# Patient Record
Sex: Male | Born: 1935 | Race: Black or African American | Hispanic: No | Marital: Single | Smoking: Former smoker
Health system: Southern US, Community
[De-identification: ages and names within clinical notes are randomized; demographics above are authoritative.]

## PROBLEM LIST (undated history)

## (undated) ENCOUNTER — Emergency Department (HOSPITAL_COMMUNITY): Admission: EM | Payer: Medicare Other | Source: Home / Self Care

## (undated) DIAGNOSIS — J449 Chronic obstructive pulmonary disease, unspecified: Secondary | ICD-10-CM

## (undated) DIAGNOSIS — I10 Essential (primary) hypertension: Secondary | ICD-10-CM

## (undated) DIAGNOSIS — Z9981 Dependence on supplemental oxygen: Secondary | ICD-10-CM

## (undated) DIAGNOSIS — I714 Abdominal aortic aneurysm, without rupture, unspecified: Secondary | ICD-10-CM

## (undated) DIAGNOSIS — R918 Other nonspecific abnormal finding of lung field: Secondary | ICD-10-CM

## (undated) DIAGNOSIS — N189 Chronic kidney disease, unspecified: Secondary | ICD-10-CM

## (undated) DIAGNOSIS — I509 Heart failure, unspecified: Secondary | ICD-10-CM

## (undated) DIAGNOSIS — Z5189 Encounter for other specified aftercare: Secondary | ICD-10-CM

## (undated) DIAGNOSIS — T7840XA Allergy, unspecified, initial encounter: Secondary | ICD-10-CM

## (undated) DIAGNOSIS — K7010 Alcoholic hepatitis without ascites: Secondary | ICD-10-CM

## (undated) DIAGNOSIS — M109 Gout, unspecified: Secondary | ICD-10-CM

## (undated) DIAGNOSIS — I4891 Unspecified atrial fibrillation: Secondary | ICD-10-CM

## (undated) DIAGNOSIS — D649 Anemia, unspecified: Secondary | ICD-10-CM

## (undated) DIAGNOSIS — S82143A Displaced bicondylar fracture of unspecified tibia, initial encounter for closed fracture: Secondary | ICD-10-CM

## (undated) DIAGNOSIS — I723 Aneurysm of iliac artery: Secondary | ICD-10-CM

## (undated) HISTORY — DX: Anemia, unspecified: D64.9

## (undated) HISTORY — DX: Abdominal aortic aneurysm, without rupture: I71.4

## (undated) HISTORY — PX: ABDOMINAL AORTIC ANEURYSM REPAIR: SUR1152

## (undated) HISTORY — DX: Chronic obstructive pulmonary disease, unspecified: J44.9

## (undated) HISTORY — DX: Abdominal aortic aneurysm, without rupture, unspecified: I71.40

## (undated) HISTORY — DX: Displaced bicondylar fracture of unspecified tibia, initial encounter for closed fracture: S82.143A

## (undated) HISTORY — DX: Allergy, unspecified, initial encounter: T78.40XA

## (undated) HISTORY — DX: Alcoholic hepatitis without ascites: K70.10

## (undated) HISTORY — PX: BLADDER SURGERY: SHX569

## (undated) HISTORY — PX: OTHER SURGICAL HISTORY: SHX169

## (undated) HISTORY — DX: Essential (primary) hypertension: I10

## (undated) HISTORY — DX: Chronic kidney disease, unspecified: N18.9

## (undated) HISTORY — DX: Dependence on supplemental oxygen: Z99.81

## (undated) HISTORY — DX: Aneurysm of iliac artery: I72.3

## (undated) HISTORY — DX: Unspecified atrial fibrillation: I48.91

## (undated) HISTORY — DX: Encounter for other specified aftercare: Z51.89

## (undated) HISTORY — DX: Heart failure, unspecified: I50.9

## (undated) HISTORY — DX: Other nonspecific abnormal finding of lung field: R91.8

---

## 1999-05-23 ENCOUNTER — Ambulatory Visit (HOSPITAL_BASED_OUTPATIENT_CLINIC_OR_DEPARTMENT_OTHER): Admission: RE | Admit: 1999-05-23 | Discharge: 1999-05-23 | Payer: Self-pay | Admitting: *Deleted

## 2000-07-10 ENCOUNTER — Ambulatory Visit (HOSPITAL_COMMUNITY): Admission: RE | Admit: 2000-07-10 | Discharge: 2000-07-10 | Payer: Self-pay | Admitting: Gastroenterology

## 2003-07-19 ENCOUNTER — Ambulatory Visit (HOSPITAL_COMMUNITY): Admission: RE | Admit: 2003-07-19 | Discharge: 2003-07-19 | Payer: Self-pay | Admitting: Gastroenterology

## 2003-07-19 ENCOUNTER — Encounter (INDEPENDENT_AMBULATORY_CARE_PROVIDER_SITE_OTHER): Payer: Self-pay | Admitting: *Deleted

## 2004-02-04 ENCOUNTER — Emergency Department (HOSPITAL_COMMUNITY): Admission: EM | Admit: 2004-02-04 | Discharge: 2004-02-04 | Payer: Self-pay | Admitting: Emergency Medicine

## 2006-08-18 ENCOUNTER — Emergency Department (HOSPITAL_COMMUNITY): Admission: EM | Admit: 2006-08-18 | Discharge: 2006-08-18 | Payer: Self-pay | Admitting: Emergency Medicine

## 2007-06-26 ENCOUNTER — Ambulatory Visit: Admission: RE | Admit: 2007-06-26 | Discharge: 2007-06-26 | Payer: Self-pay | Admitting: Urology

## 2007-07-14 ENCOUNTER — Inpatient Hospital Stay (HOSPITAL_COMMUNITY): Admission: RE | Admit: 2007-07-14 | Discharge: 2007-07-17 | Payer: Self-pay | Admitting: Urology

## 2007-07-14 ENCOUNTER — Encounter (INDEPENDENT_AMBULATORY_CARE_PROVIDER_SITE_OTHER): Payer: Self-pay | Admitting: Urology

## 2010-01-31 ENCOUNTER — Encounter: Admission: RE | Admit: 2010-01-31 | Discharge: 2010-01-31 | Payer: Self-pay | Admitting: Cardiology

## 2010-02-15 ENCOUNTER — Inpatient Hospital Stay (HOSPITAL_COMMUNITY): Admission: EM | Admit: 2010-02-15 | Discharge: 2010-02-20 | Payer: Self-pay | Admitting: Emergency Medicine

## 2010-02-19 ENCOUNTER — Encounter (INDEPENDENT_AMBULATORY_CARE_PROVIDER_SITE_OTHER): Payer: Self-pay | Admitting: Internal Medicine

## 2010-02-20 ENCOUNTER — Telehealth: Payer: Self-pay | Admitting: Internal Medicine

## 2010-03-16 DIAGNOSIS — I509 Heart failure, unspecified: Secondary | ICD-10-CM | POA: Insufficient documentation

## 2010-03-16 DIAGNOSIS — I340 Nonrheumatic mitral (valve) insufficiency: Secondary | ICD-10-CM | POA: Insufficient documentation

## 2010-03-16 DIAGNOSIS — D649 Anemia, unspecified: Secondary | ICD-10-CM | POA: Insufficient documentation

## 2010-03-16 DIAGNOSIS — K2211 Ulcer of esophagus with bleeding: Secondary | ICD-10-CM | POA: Insufficient documentation

## 2010-03-16 DIAGNOSIS — I4891 Unspecified atrial fibrillation: Secondary | ICD-10-CM | POA: Insufficient documentation

## 2010-03-16 DIAGNOSIS — J4489 Other specified chronic obstructive pulmonary disease: Secondary | ICD-10-CM | POA: Insufficient documentation

## 2010-03-16 DIAGNOSIS — J449 Chronic obstructive pulmonary disease, unspecified: Secondary | ICD-10-CM | POA: Insufficient documentation

## 2010-03-20 ENCOUNTER — Telehealth: Payer: Self-pay | Admitting: Internal Medicine

## 2010-06-07 ENCOUNTER — Inpatient Hospital Stay (HOSPITAL_COMMUNITY): Admission: EM | Admit: 2010-06-07 | Discharge: 2010-06-12 | Payer: Self-pay | Admitting: Emergency Medicine

## 2010-08-28 ENCOUNTER — Inpatient Hospital Stay (HOSPITAL_COMMUNITY)
Admission: EM | Admit: 2010-08-28 | Discharge: 2010-09-06 | Payer: Self-pay | Source: Home / Self Care | Attending: Internal Medicine | Admitting: Internal Medicine

## 2010-08-29 LAB — BASIC METABOLIC PANEL
BUN: 28 mg/dL — ABNORMAL HIGH (ref 6–23)
CO2: 22 mEq/L (ref 19–32)
Calcium: 9 mg/dL (ref 8.4–10.5)
Chloride: 108 mEq/L (ref 96–112)
Creatinine, Ser: 1.65 mg/dL — ABNORMAL HIGH (ref 0.4–1.5)
GFR calc Af Amer: 50 mL/min — ABNORMAL LOW (ref >60)
GFR calc non Af Amer: 41 mL/min — ABNORMAL LOW (ref >60)
Glucose, Bld: 164 mg/dL — ABNORMAL HIGH (ref 70–99)
Potassium: 4.6 mEq/L (ref 3.5–5.1)
Sodium: 141 mEq/L (ref 135–145)

## 2010-08-29 LAB — BLOOD GAS, ARTERIAL
Acid-base deficit: 3.6 mmol/L — ABNORMAL HIGH (ref 0.0–2.0)
Bicarbonate: 20.8 mEq/L (ref 20.0–24.0)
Drawn by: 244801
O2 Content: 3 L/min
O2 Saturation: 98.9 %
Patient temperature: 98.6
TCO2: 21.9 mmol/L (ref 0–100)
pCO2 arterial: 36.8 mmHg (ref 35.0–45.0)
pH, Arterial: 7.371 (ref 7.350–7.450)
pO2, Arterial: 114 mmHg — ABNORMAL HIGH (ref 80.0–100.0)

## 2010-08-29 LAB — CARDIAC PANEL(CRET KIN+CKTOT+MB+TROPI)
CK, MB: 5 ng/mL — ABNORMAL HIGH (ref 0.3–4.0)
Relative Index: INVALID (ref 0.0–2.5)
Total CK: 78 U/L (ref 7–232)
Troponin I: 0.05 ng/mL (ref 0.00–0.06)

## 2010-08-29 LAB — TSH: TSH: 0.474 u[IU]/mL (ref 0.350–4.500)

## 2010-08-30 LAB — CBC
HCT: 26.5 % — ABNORMAL LOW (ref 39.0–52.0)
Hemoglobin: 7.3 g/dL — ABNORMAL LOW (ref 13.0–17.0)
MCH: 20 pg — ABNORMAL LOW (ref 26.0–34.0)
MCHC: 27.5 g/dL — ABNORMAL LOW (ref 30.0–36.0)
MCV: 72.6 fL — ABNORMAL LOW (ref 78.0–100.0)
Platelets: 143 10*3/uL — ABNORMAL LOW (ref 150–400)
RBC: 3.65 MIL/uL — ABNORMAL LOW (ref 4.22–5.81)
RDW: 20.1 % — ABNORMAL HIGH (ref 11.5–15.5)
WBC: 11 10*3/uL — ABNORMAL HIGH (ref 4.0–10.5)

## 2010-08-30 LAB — BASIC METABOLIC PANEL
BUN: 32 mg/dL — ABNORMAL HIGH (ref 6–23)
CO2: 22 mEq/L (ref 19–32)
Calcium: 8.8 mg/dL (ref 8.4–10.5)
Chloride: 107 mEq/L (ref 96–112)
Creatinine, Ser: 1.54 mg/dL — ABNORMAL HIGH (ref 0.4–1.5)
GFR calc Af Amer: 54 mL/min — ABNORMAL LOW (ref >60)
GFR calc non Af Amer: 45 mL/min — ABNORMAL LOW (ref >60)
Glucose, Bld: 161 mg/dL — ABNORMAL HIGH (ref 70–99)
Potassium: 4.3 mEq/L (ref 3.5–5.1)
Sodium: 137 mEq/L (ref 135–145)

## 2010-08-31 LAB — BASIC METABOLIC PANEL
BUN: 35 mg/dL — ABNORMAL HIGH (ref 6–23)
CO2: 23 mEq/L (ref 19–32)
Calcium: 8.9 mg/dL (ref 8.4–10.5)
Chloride: 104 mEq/L (ref 96–112)
Creatinine, Ser: 1.36 mg/dL (ref 0.4–1.5)
GFR calc Af Amer: >60 mL/min (ref >60)
GFR calc non Af Amer: 52 mL/min — ABNORMAL LOW (ref >60)
Glucose, Bld: 122 mg/dL — ABNORMAL HIGH (ref 70–99)
Potassium: 4.5 mEq/L (ref 3.5–5.1)
Sodium: 138 mEq/L (ref 135–145)

## 2010-08-31 LAB — CBC
HCT: 26.7 % — ABNORMAL LOW (ref 39.0–52.0)
Hemoglobin: 7.4 g/dL — ABNORMAL LOW (ref 13.0–17.0)
MCH: 20.2 pg — ABNORMAL LOW (ref 26.0–34.0)
MCHC: 27.7 g/dL — ABNORMAL LOW (ref 30.0–36.0)
MCV: 72.8 fL — ABNORMAL LOW (ref 78.0–100.0)
Platelets: 155 10*3/uL (ref 150–400)
RBC: 3.67 MIL/uL — ABNORMAL LOW (ref 4.22–5.81)
RDW: 20.2 % — ABNORMAL HIGH (ref 11.5–15.5)
WBC: 15.1 10*3/uL — ABNORMAL HIGH (ref 4.0–10.5)

## 2010-08-31 LAB — PREPARE RBC (CROSSMATCH)

## 2010-09-10 LAB — MAGNESIUM
Magnesium: 2.4 mg/dL (ref 1.5–2.5)
Magnesium: 2.4 mg/dL (ref 1.5–2.5)

## 2010-09-10 LAB — CBC
HCT: 31.5 % — ABNORMAL LOW (ref 39.0–52.0)
HCT: 33.3 % — ABNORMAL LOW (ref 39.0–52.0)
HCT: 32.2 % — ABNORMAL LOW (ref 39.0–52.0)
Hemoglobin: 8.9 g/dL — ABNORMAL LOW (ref 13.0–17.0)
Hemoglobin: 9.4 g/dL — ABNORMAL LOW (ref 13.0–17.0)
Hemoglobin: 9 g/dL — ABNORMAL LOW (ref 13.0–17.0)
MCH: 21.2 pg — ABNORMAL LOW (ref 26.0–34.0)
MCH: 21.5 pg — ABNORMAL LOW (ref 26.0–34.0)
MCH: 21.3 pg — ABNORMAL LOW (ref 26.0–34.0)
MCHC: 28.3 g/dL — ABNORMAL LOW (ref 30.0–36.0)
MCHC: 28.2 g/dL — ABNORMAL LOW (ref 30.0–36.0)
MCHC: 28 g/dL — ABNORMAL LOW (ref 30.0–36.0)
MCV: 75 fL — ABNORMAL LOW (ref 78.0–100.0)
MCV: 76 fL — ABNORMAL LOW (ref 78.0–100.0)
MCV: 76.3 fL — ABNORMAL LOW (ref 78.0–100.0)
Platelets: 201 10*3/uL (ref 150–400)
Platelets: 201 10*3/uL (ref 150–400)
Platelets: 190 10*3/uL (ref 150–400)
RBC: 4.2 MIL/uL — ABNORMAL LOW (ref 4.22–5.81)
RBC: 4.38 MIL/uL (ref 4.22–5.81)
RBC: 4.22 MIL/uL (ref 4.22–5.81)
RDW: 21.4 % — ABNORMAL HIGH (ref 11.5–15.5)
RDW: 22.2 % — ABNORMAL HIGH (ref 11.5–15.5)
RDW: 22.7 % — ABNORMAL HIGH (ref 11.5–15.5)
WBC: 12.7 10*3/uL — ABNORMAL HIGH (ref 4.0–10.5)
WBC: 13.9 10*3/uL — ABNORMAL HIGH (ref 4.0–10.5)
WBC: 13.6 10*3/uL — ABNORMAL HIGH (ref 4.0–10.5)

## 2010-09-10 LAB — BASIC METABOLIC PANEL
BUN: 39 mg/dL — ABNORMAL HIGH (ref 6–23)
BUN: 37 mg/dL — ABNORMAL HIGH (ref 6–23)
BUN: 42 mg/dL — ABNORMAL HIGH (ref 6–23)
BUN: 45 mg/dL — ABNORMAL HIGH (ref 6–23)
BUN: 50 mg/dL — ABNORMAL HIGH (ref 6–23)
CO2: 26 mEq/L (ref 19–32)
CO2: 28 mEq/L (ref 19–32)
CO2: 30 mEq/L (ref 19–32)
CO2: 30 mEq/L (ref 19–32)
CO2: 27 mEq/L (ref 19–32)
Calcium: 8.7 mg/dL (ref 8.4–10.5)
Calcium: 8.8 mg/dL (ref 8.4–10.5)
Calcium: 8.8 mg/dL (ref 8.4–10.5)
Calcium: 8.4 mg/dL (ref 8.4–10.5)
Calcium: 8.5 mg/dL (ref 8.4–10.5)
Chloride: 100 mEq/L (ref 96–112)
Chloride: 103 mEq/L (ref 96–112)
Chloride: 102 mEq/L (ref 96–112)
Chloride: 104 mEq/L (ref 96–112)
Chloride: 101 mEq/L (ref 96–112)
Creatinine, Ser: 1.32 mg/dL (ref 0.4–1.5)
Creatinine, Ser: 1.36 mg/dL (ref 0.4–1.5)
Creatinine, Ser: 1.33 mg/dL (ref 0.4–1.5)
Creatinine, Ser: 1.39 mg/dL (ref 0.4–1.5)
Creatinine, Ser: 1.67 mg/dL — ABNORMAL HIGH (ref 0.4–1.5)
GFR calc Af Amer: >60 mL/min (ref >60)
GFR calc Af Amer: >60 mL/min (ref >60)
GFR calc Af Amer: >60 mL/min (ref >60)
GFR calc Af Amer: >60 mL/min (ref >60)
GFR calc Af Amer: 49 mL/min — ABNORMAL LOW (ref >60)
GFR calc non Af Amer: 53 mL/min — ABNORMAL LOW (ref >60)
GFR calc non Af Amer: 52 mL/min — ABNORMAL LOW (ref >60)
GFR calc non Af Amer: 53 mL/min — ABNORMAL LOW (ref >60)
GFR calc non Af Amer: 50 mL/min — ABNORMAL LOW (ref >60)
GFR calc non Af Amer: 41 mL/min — ABNORMAL LOW (ref >60)
Glucose, Bld: 102 mg/dL — ABNORMAL HIGH (ref 70–99)
Glucose, Bld: 153 mg/dL — ABNORMAL HIGH (ref 70–99)
Glucose, Bld: 169 mg/dL — ABNORMAL HIGH (ref 70–99)
Glucose, Bld: 118 mg/dL — ABNORMAL HIGH (ref 70–99)
Glucose, Bld: 129 mg/dL — ABNORMAL HIGH (ref 70–99)
Potassium: 3.9 mEq/L (ref 3.5–5.1)
Potassium: 3.9 mEq/L (ref 3.5–5.1)
Potassium: 3.9 mEq/L (ref 3.5–5.1)
Potassium: 5 mEq/L (ref 3.5–5.1)
Potassium: 5.3 mEq/L — ABNORMAL HIGH (ref 3.5–5.1)
Sodium: 136 mEq/L (ref 135–145)
Sodium: 140 mEq/L (ref 135–145)
Sodium: 141 mEq/L (ref 135–145)
Sodium: 139 mEq/L (ref 135–145)
Sodium: 138 mEq/L (ref 135–145)

## 2010-09-10 LAB — DIFFERENTIAL
Basophils Absolute: 0 10*3/uL (ref 0.0–0.1)
Basophils Relative: 0 % (ref 0–1)
Eosinophils Absolute: 0 10*3/uL (ref 0.0–0.7)
Eosinophils Relative: 0 % (ref 0–5)
Lymphocytes Relative: 1 % — ABNORMAL LOW (ref 12–46)
Lymphs Abs: 0.1 10*3/uL — ABNORMAL LOW (ref 0.7–4.0)
Monocytes Absolute: 0.4 10*3/uL (ref 0.1–1.0)
Monocytes Relative: 3 % (ref 3–12)
Neutro Abs: 12.2 10*3/uL — ABNORMAL HIGH (ref 1.7–7.7)
Neutrophils Relative %: 96 % — ABNORMAL HIGH (ref 43–77)

## 2010-09-10 LAB — RENAL FUNCTION PANEL
Albumin: 3.2 g/dL — ABNORMAL LOW (ref 3.5–5.2)
BUN: 49 mg/dL — ABNORMAL HIGH (ref 6–23)
CO2: 31 mEq/L (ref 19–32)
Calcium: 8.5 mg/dL (ref 8.4–10.5)
Chloride: 101 mEq/L (ref 96–112)
Creatinine, Ser: 1.5 mg/dL (ref 0.4–1.5)
GFR calc Af Amer: 56 mL/min — ABNORMAL LOW (ref >60)
GFR calc non Af Amer: 46 mL/min — ABNORMAL LOW (ref >60)
Glucose, Bld: 85 mg/dL (ref 70–99)
Phosphorus: 3.1 mg/dL (ref 2.3–4.6)
Potassium: 5.4 mEq/L — ABNORMAL HIGH (ref 3.5–5.1)
Sodium: 136 mEq/L (ref 135–145)

## 2010-09-17 NOTE — Progress Notes (Addendum)
[name], [name]               ACCOUNT NO.:  [id number]  MEDICAL RECORD NO.:  [id number]          PATIENT TYPE:  INP  LOCATION:  4737                         FACILITY:  [facility]  PHYSICIAN:  Osvaldo Shipper, MD     DATE OF BIRTH:  09-Nov-1935                                PROGRESS NOTE   PRIMARY CARE PROVIDER: [name], MD with Deboraha Sprang at [facility].  CONSULTATIONS: During this hospitalization none so far.  IMAGING STUDIES: Imaging studies done include chest x-ray on August 28, 2010, which showed chronic cardiomegaly, lung disease, and small pleural effusions. No acute findings were noted.  X-ray repeated on August 31, 2010, which showed low lung volumes and stable cardiomegaly.  Chronic bronchitis was also noted.  DISCHARGE DIAGNOSES: At the time of this note; 1. Acute chronic obstructive pulmonary disease exacerbation,     improving. 2. Atrial fibrillation with RVR, stable. 3. Chronic diastolic heart failure, stable on Lasix. 4. Anemia, improved status post transfusion. 5. Severe mitral regurgitation, stable. 6. History of hypertension, stable.  BRIEF HOSPITAL COURSE: 1. Acute COPD exacerbation.  This is a 75 year old African American     male, who has a history of COPD, who came in with shortness of     breath and was found to have acute COPD exacerbation.  The patient     was to put nebulizer treatments steroids antibiotics.  He was very     slow to improve over the last 2-3 days.  However, he has shown     significant improvement.  His breathing has resolved.  He is     breathing much more comfortably.  Home O2 assessment was done and     he is saturating 99% on room air, so he will not require home     oxygenation.  Antibiotics will be discontinued after today. 2. AFib with RPR.  This was noted yesterday morning.  I was put on     Cardizem drip.  He was given digoxin intravenously.  He will be put     on digoxin today.  His heart rate  has been 80-100.  We will     transition his Cardizem now back to oral.  He may require higher     dose of his Cardizem CD.  For now, we will use single short-acting     Cardizem.  We are able to manage this heart rate.  If there are any     difficulties with getting controlled, Cardiology consultation may     be obtained. 3. He has a history of GI bleed and anemia.  His hemoglobin was 7.4.     Because he was quite dyspneic, we transfused him a unit hoping that     it will help his dyspnea and it looks like it might have hemoglobin     has been stable. 4. He has a history of chronic diastolic heart failure and severe     mitral regurgitation.  He was initially diuresed intravenously and     now he is on p.o. Lasix.  PHYSICAL EXAMINATION: GENERAL:  On today that is September 04, 2010, the patient is feeling better.  Denies any chest pain or shortness of breath that is more than usual. VITAL SIGNS:  Stable with temperature 97.5, heart rate 102, respiratory rate 16, blood pressure 105/78, and saturation 99% on room air. LUNGS:  Clear to auscultation bilaterally. CARDIOVASCULAR:  S1 and S2 is irregularly irregular. ABDOMEN:  Soft, nontender, and nondistended. EXTREMITIES:  No pedal edema is present. NEUROLOGIC:  He is alert and oriented x3.  No focal neurological deficits are present.  LABORATORY DATA: His white cell count is 38.6 today.  Hemoglobin is 9.0 and platelet count is 190.  Sodium is 139, potassium is 5.0, glucose is 118, BUN is 45, and creatinine is 0.39.  ASSESSMENT AND PLAN: As per above.  Basically, we await improvement primarily and his atrial fibrillation rate controlled.  He will be transitioned to oral medications today.  Once that is achieved, he should be able to go home pretty soon after that.  Discharge instructions, medications, and etc., will be dictated at the time of actual discharge.     Osvaldo Shipper, MD     GK/MEDQ  D:  09/04/2010  T:   09/04/2010  Job:  [id number]  Electronically Signed by Osvaldo Shipper MD on 09/17/2010 08:44:13 PM

## 2010-09-27 NOTE — Progress Notes (Signed)
Summary: nos appt  Phone Note Call from Patient   Caller: [email or web address]  Call For: Raedyn Klinck Summary of Call: LMTCB x2 to rsc nos from 7/25. Initial call taken by: Darletta Moll,  March 20, 2010 2:13 PM     Appended Document: nos appt notify referring phyisician if applicable re no show x 2 and do not reschedule making sure pt's coming with charge if no show  Appended Document: nos appt called and spoke with Dr. Barbaraann Barthel office to make them aware that pt no showed x 2 appts.

## 2010-09-27 NOTE — Progress Notes (Signed)
Summary: nos appt  Phone Note Call from Patient   Caller: [name]  Call For: [name] Summary of Call: In ref to nos from 6/27, pt is in hospital, no other info available. Initial call taken by: Darletta Moll,  February 20, 2010 11:09 AM

## 2010-11-05 LAB — CK TOTAL AND CKMB (NOT AT ARMC)
CK, MB: 4.2 ng/mL — ABNORMAL HIGH (ref 0.3–4.0)
CK, MB: 4.6 ng/mL — ABNORMAL HIGH (ref 0.3–4.0)
Relative Index: INVALID (ref 0.0–2.5)
Relative Index: INVALID (ref 0.0–2.5)
Total CK: 74 U/L (ref 7–232)
Total CK: 89 U/L (ref 7–232)

## 2010-11-05 LAB — BASIC METABOLIC PANEL
BUN: 21 mg/dL (ref 6–23)
CO2: 23 mEq/L (ref 19–32)
Calcium: 9.1 mg/dL (ref 8.4–10.5)
Chloride: 111 mEq/L (ref 96–112)
Creatinine, Ser: 1.18 mg/dL (ref 0.4–1.5)
GFR calc Af Amer: >60 mL/min (ref >60)
GFR calc non Af Amer: >60 mL/min (ref >60)
Glucose, Bld: 74 mg/dL (ref 70–99)
Potassium: 4.3 mEq/L (ref 3.5–5.1)
Sodium: 141 mEq/L (ref 135–145)

## 2010-11-05 LAB — TYPE AND SCREEN
ABO/RH(D): B POS
Antibody Screen: NEGATIVE
Unit division: 0

## 2010-11-05 LAB — CBC
HCT: 30.3 % — ABNORMAL LOW (ref 39.0–52.0)
Hemoglobin: 8.1 g/dL — ABNORMAL LOW (ref 13.0–17.0)
MCH: 19.7 pg — ABNORMAL LOW (ref 26.0–34.0)
MCHC: 26.7 g/dL — ABNORMAL LOW (ref 30.0–36.0)
MCV: 73.5 fL — ABNORMAL LOW (ref 78.0–100.0)
Platelets: 166 10*3/uL (ref 150–400)
RBC: 4.12 MIL/uL — ABNORMAL LOW (ref 4.22–5.81)
RDW: 20.1 % — ABNORMAL HIGH (ref 11.5–15.5)
WBC: 6.9 10*3/uL (ref 4.0–10.5)

## 2010-11-05 LAB — POCT CARDIAC MARKERS
CKMB, poc: 2.3 ng/mL (ref 1.0–8.0)
Myoglobin, poc: 67.4 ng/mL (ref 12–200)
Troponin i, poc: <0.05 ng/mL (ref 0.00–0.09)

## 2010-11-05 LAB — URINALYSIS, ROUTINE W REFLEX MICROSCOPIC
Bilirubin Urine: NEGATIVE
Glucose, UA: NEGATIVE mg/dL
Hgb urine dipstick: NEGATIVE
Ketones, ur: NEGATIVE mg/dL
Nitrite: NEGATIVE
Protein, ur: 30 mg/dL — AB
Specific Gravity, Urine: 1.024 (ref 1.005–1.030)
Urobilinogen, UA: 1 mg/dL (ref 0.0–1.0)
pH: 5 (ref 5.0–8.0)

## 2010-11-05 LAB — DIFFERENTIAL
Basophils Absolute: 0.1 10*3/uL (ref 0.0–0.1)
Basophils Relative: 1 % (ref 0–1)
Eosinophils Absolute: 0.1 10*3/uL (ref 0.0–0.7)
Eosinophils Relative: 2 % (ref 0–5)
Lymphocytes Relative: 14 % (ref 12–46)
Lymphs Abs: 1 10*3/uL (ref 0.7–4.0)
Monocytes Absolute: 0.6 10*3/uL (ref 0.1–1.0)
Monocytes Relative: 8 % (ref 3–12)
Neutro Abs: 5.1 10*3/uL (ref 1.7–7.7)
Neutrophils Relative %: 75 % (ref 43–77)

## 2010-11-05 LAB — LEGIONELLA ANTIGEN, URINE: Legionella Antigen, Urine: NEGATIVE

## 2010-11-05 LAB — STREP PNEUMONIAE URINARY ANTIGEN: Strep Pneumo Urinary Antigen: NEGATIVE

## 2010-11-05 LAB — URINE MICROSCOPIC-ADD ON

## 2010-11-05 LAB — TROPONIN I
Troponin I: 0.03 ng/mL (ref 0.00–0.06)
Troponin I: 0.04 ng/mL (ref 0.00–0.06)

## 2010-11-05 LAB — BRAIN NATRIURETIC PEPTIDE: Pro B Natriuretic peptide (BNP): 673 pg/mL — ABNORMAL HIGH (ref 0.0–100.0)

## 2010-11-07 LAB — BASIC METABOLIC PANEL
BUN: 21 mg/dL (ref 6–23)
BUN: 22 mg/dL (ref 6–23)
BUN: 23 mg/dL (ref 6–23)
BUN: 30 mg/dL — ABNORMAL HIGH (ref 6–23)
CO2: 25 mEq/L (ref 19–32)
CO2: 27 mEq/L (ref 19–32)
CO2: 27 mEq/L (ref 19–32)
Calcium: 8.6 mg/dL (ref 8.4–10.5)
Calcium: 8.6 mg/dL (ref 8.4–10.5)
Chloride: 101 mEq/L (ref 96–112)
Chloride: 101 mEq/L (ref 96–112)
Chloride: 98 mEq/L (ref 96–112)
Chloride: 99 mEq/L (ref 96–112)
Creatinine, Ser: 1.36 mg/dL (ref 0.4–1.5)
Creatinine, Ser: 1.41 mg/dL (ref 0.4–1.5)
Creatinine, Ser: 1.42 mg/dL (ref 0.4–1.5)
Creatinine, Ser: 1.43 mg/dL (ref 0.4–1.5)
GFR calc Af Amer: 59 mL/min — ABNORMAL LOW (ref >60)
GFR calc Af Amer: 59 mL/min — ABNORMAL LOW (ref >60)
GFR calc Af Amer: 60 mL/min — ABNORMAL LOW (ref >60)
GFR calc non Af Amer: 49 mL/min — ABNORMAL LOW (ref >60)
GFR calc non Af Amer: 49 mL/min — ABNORMAL LOW (ref >60)
Glucose, Bld: 115 mg/dL — ABNORMAL HIGH (ref 70–99)
Glucose, Bld: 87 mg/dL (ref 70–99)
Glucose, Bld: 91 mg/dL (ref 70–99)
Potassium: 3.7 mEq/L (ref 3.5–5.1)
Potassium: 3.7 mEq/L (ref 3.5–5.1)
Sodium: 133 mEq/L — ABNORMAL LOW (ref 135–145)
Sodium: 133 mEq/L — ABNORMAL LOW (ref 135–145)

## 2010-11-07 LAB — CBC
HCT: 23.6 % — ABNORMAL LOW (ref 39.0–52.0)
HCT: 23.7 % — ABNORMAL LOW (ref 39.0–52.0)
Hemoglobin: 7.1 g/dL — ABNORMAL LOW (ref 13.0–17.0)
Hemoglobin: 7.1 g/dL — ABNORMAL LOW (ref 13.0–17.0)
MCH: 24.9 pg — ABNORMAL LOW (ref 26.0–34.0)
MCH: 25.1 pg — ABNORMAL LOW (ref 26.0–34.0)
MCH: 25.5 pg — ABNORMAL LOW (ref 26.0–34.0)
MCH: 25.5 pg — ABNORMAL LOW (ref 26.0–34.0)
MCHC: 29.1 g/dL — ABNORMAL LOW (ref 30.0–36.0)
MCHC: 30 g/dL (ref 30.0–36.0)
MCHC: 30.1 g/dL (ref 30.0–36.0)
MCV: 84.9 fL (ref 78.0–100.0)
MCV: 84.9 fL (ref 78.0–100.0)
MCV: 85.3 fL (ref 78.0–100.0)
MCV: 86.1 fL (ref 78.0–100.0)
Platelets: 124 10*3/uL — ABNORMAL LOW (ref 150–400)
Platelets: 143 10*3/uL — ABNORMAL LOW (ref 150–400)
Platelets: 167 10*3/uL (ref 150–400)
Platelets: 171 10*3/uL (ref 150–400)
RBC: 2.78 MIL/uL — ABNORMAL LOW (ref 4.22–5.81)
RBC: 2.78 MIL/uL — ABNORMAL LOW (ref 4.22–5.81)
RBC: 3.05 MIL/uL — ABNORMAL LOW (ref 4.22–5.81)
RDW: 19 % — ABNORMAL HIGH (ref 11.5–15.5)
RDW: 19.1 % — ABNORMAL HIGH (ref 11.5–15.5)
RDW: 19.2 % — ABNORMAL HIGH (ref 11.5–15.5)
WBC: 7 10*3/uL (ref 4.0–10.5)
WBC: 7.5 10*3/uL (ref 4.0–10.5)
WBC: 8.5 10*3/uL (ref 4.0–10.5)

## 2010-11-07 LAB — HEPATITIS PANEL, ACUTE
HCV Ab: NEGATIVE
Hep A IgM: NEGATIVE
Hep B C IgM: NEGATIVE
Hepatitis B Surface Ag: NEGATIVE

## 2010-11-07 LAB — DIFFERENTIAL
Basophils Absolute: 0 10*3/uL (ref 0.0–0.1)
Basophils Relative: 0 % (ref 0–1)
Eosinophils Absolute: 0 10*3/uL (ref 0.0–0.7)
Eosinophils Relative: 0 % (ref 0–5)

## 2010-11-07 LAB — BRAIN NATRIURETIC PEPTIDE: Pro B Natriuretic peptide (BNP): 878 pg/mL — ABNORMAL HIGH (ref 0.0–100.0)

## 2010-11-08 LAB — CROSSMATCH
ABO/RH(D): B POS
Unit division: 0

## 2010-11-08 LAB — COMPREHENSIVE METABOLIC PANEL
ALT: 11 U/L (ref 0–53)
AST: 24 U/L (ref 0–37)
Albumin: 2.9 g/dL — ABNORMAL LOW (ref 3.5–5.2)
CO2: 24 mEq/L (ref 19–32)
Calcium: 8.7 mg/dL (ref 8.4–10.5)
Creatinine, Ser: 1.29 mg/dL (ref 0.4–1.5)
GFR calc Af Amer: >60 mL/min (ref >60)
Sodium: 140 mEq/L (ref 135–145)
Total Protein: 5.9 g/dL — ABNORMAL LOW (ref 6.0–8.3)

## 2010-11-08 LAB — CBC
Hemoglobin: 7.2 g/dL — ABNORMAL LOW (ref 13.0–17.0)
MCH: 25.6 pg — ABNORMAL LOW (ref 26.0–34.0)
MCH: 26.1 pg (ref 26.0–34.0)
MCHC: 29.8 g/dL — ABNORMAL LOW (ref 30.0–36.0)
MCHC: 29.8 g/dL — ABNORMAL LOW (ref 30.0–36.0)
Platelets: 139 10*3/uL — ABNORMAL LOW (ref 150–400)
RDW: 19.1 % — ABNORMAL HIGH (ref 11.5–15.5)

## 2010-11-08 LAB — BASIC METABOLIC PANEL
BUN: 14 mg/dL (ref 6–23)
CO2: 24 mEq/L (ref 19–32)
Calcium: 9 mg/dL (ref 8.4–10.5)
GFR calc non Af Amer: 58 mL/min — ABNORMAL LOW (ref >60)
Glucose, Bld: 82 mg/dL (ref 70–99)

## 2010-11-08 LAB — GLUCOSE, CAPILLARY
Glucose-Capillary: 111 mg/dL — ABNORMAL HIGH (ref 70–99)
Glucose-Capillary: 126 mg/dL — ABNORMAL HIGH (ref 70–99)
Glucose-Capillary: 144 mg/dL — ABNORMAL HIGH (ref 70–99)

## 2010-11-08 LAB — TROPONIN I: Troponin I: 0.02 ng/mL (ref 0.00–0.06)

## 2010-11-08 LAB — LACTATE DEHYDROGENASE: LDH: 177 U/L (ref 94–250)

## 2010-11-08 LAB — PROTIME-INR: INR: 1.29 (ref 0.00–1.49)

## 2010-11-08 LAB — BRAIN NATRIURETIC PEPTIDE: Pro B Natriuretic peptide (BNP): 683 pg/mL — ABNORMAL HIGH (ref 0.0–100.0)

## 2010-11-11 LAB — CBC
HCT: 18.5 % — ABNORMAL LOW (ref 39.0–52.0)
HCT: 26.2 % — ABNORMAL LOW (ref 39.0–52.0)
HCT: 32.5 % — ABNORMAL LOW (ref 39.0–52.0)
HCT: 33.1 % — ABNORMAL LOW (ref 39.0–52.0)
HCT: 33.8 % — ABNORMAL LOW (ref 39.0–52.0)
Hemoglobin: 10.4 g/dL — ABNORMAL LOW (ref 13.0–17.0)
Hemoglobin: 10.8 g/dL — ABNORMAL LOW (ref 13.0–17.0)
Hemoglobin: 5.6 g/dL — CL (ref 13.0–17.0)
MCH: 25.9 pg — ABNORMAL LOW (ref 26.0–34.0)
MCHC: 30.1 g/dL (ref 30.0–36.0)
MCV: 80.8 fL (ref 78.0–100.0)
MCV: 82.6 fL (ref 78.0–100.0)
Platelets: 145 10*3/uL — ABNORMAL LOW (ref 150–400)
Platelets: 174 10*3/uL (ref 150–400)
RBC: 2.43 MIL/uL — ABNORMAL LOW (ref 4.22–5.81)
RBC: 3.24 MIL/uL — ABNORMAL LOW (ref 4.22–5.81)
RBC: 4 MIL/uL — ABNORMAL LOW (ref 4.22–5.81)
RBC: 4.09 MIL/uL — ABNORMAL LOW (ref 4.22–5.81)
RDW: 18.7 % — ABNORMAL HIGH (ref 11.5–15.5)
RDW: 19.7 % — ABNORMAL HIGH (ref 11.5–15.5)
WBC: 6.1 10*3/uL (ref 4.0–10.5)
WBC: 7 10*3/uL (ref 4.0–10.5)
WBC: 7.7 10*3/uL (ref 4.0–10.5)
WBC: 7.8 10*3/uL (ref 4.0–10.5)
WBC: 9.4 10*3/uL (ref 4.0–10.5)

## 2010-11-11 LAB — COMPREHENSIVE METABOLIC PANEL
ALT: 14 U/L (ref 0–53)
AST: 23 U/L (ref 0–37)
Alkaline Phosphatase: 77 U/L (ref 39–117)
CO2: 18 mEq/L — ABNORMAL LOW (ref 19–32)
Calcium: 9 mg/dL (ref 8.4–10.5)
GFR calc Af Amer: 52 mL/min — ABNORMAL LOW (ref >60)
GFR calc non Af Amer: 43 mL/min — ABNORMAL LOW (ref >60)
Glucose, Bld: 79 mg/dL (ref 70–99)
Potassium: 4.3 mEq/L (ref 3.5–5.1)
Sodium: 139 mEq/L (ref 135–145)

## 2010-11-11 LAB — TYPE AND SCREEN
Antibody Screen: POSITIVE
DAT, IgG: NEGATIVE
Donor AG Type: NEGATIVE
Donor AG Type: NEGATIVE
PT AG Type: NEGATIVE

## 2010-11-11 LAB — DIFFERENTIAL
Basophils Absolute: 0 10*3/uL (ref 0.0–0.1)
Basophils Relative: 0 % (ref 0–1)
Eosinophils Absolute: 0.2 10*3/uL (ref 0.0–0.7)
Eosinophils Relative: 3 % (ref 0–5)
Metamyelocytes Relative: 0 %
Myelocytes: 0 %
Promyelocytes Absolute: 0 %
nRBC: 0 /100 WBC

## 2010-11-11 LAB — BASIC METABOLIC PANEL
BUN: 15 mg/dL (ref 6–23)
BUN: 18 mg/dL (ref 6–23)
BUN: 9 mg/dL (ref 6–23)
CO2: 19 mEq/L (ref 19–32)
Chloride: 100 mEq/L (ref 96–112)
Chloride: 106 mEq/L (ref 96–112)
Chloride: 109 mEq/L (ref 96–112)
Chloride: 109 mEq/L (ref 96–112)
Creatinine, Ser: 1.05 mg/dL (ref 0.4–1.5)
Creatinine, Ser: 1.29 mg/dL (ref 0.4–1.5)
GFR calc Af Amer: 52 mL/min — ABNORMAL LOW (ref >60)
GFR calc Af Amer: 59 mL/min — ABNORMAL LOW (ref >60)
GFR calc Af Amer: >60 mL/min (ref >60)
GFR calc Af Amer: >60 mL/min (ref >60)
GFR calc non Af Amer: 43 mL/min — ABNORMAL LOW (ref >60)
GFR calc non Af Amer: 49 mL/min — ABNORMAL LOW (ref >60)
GFR calc non Af Amer: >60 mL/min (ref >60)
GFR calc non Af Amer: >60 mL/min (ref >60)
Potassium: 3.3 mEq/L — ABNORMAL LOW (ref 3.5–5.1)
Potassium: 3.7 mEq/L (ref 3.5–5.1)
Potassium: 4.2 mEq/L (ref 3.5–5.1)
Potassium: 4.4 mEq/L (ref 3.5–5.1)
Potassium: 4.7 mEq/L (ref 3.5–5.1)
Sodium: 134 mEq/L — ABNORMAL LOW (ref 135–145)
Sodium: 139 mEq/L (ref 135–145)

## 2010-11-11 LAB — PROTIME-INR
INR: 1.26 (ref 0.00–1.49)
Prothrombin Time: 15.7 seconds — ABNORMAL HIGH (ref 11.6–15.2)

## 2010-11-11 LAB — PROTEIN ELECTROPH W RFLX QUANT IMMUNOGLOBULINS
Albumin ELP: 47.5 % — ABNORMAL LOW (ref 55.8–66.1)
Alpha-1-Globulin: 7.3 % — ABNORMAL HIGH (ref 2.9–4.9)
Alpha-2-Globulin: 11.8 % (ref 7.1–11.8)
Beta 2: 10.7 % — ABNORMAL HIGH (ref 3.2–6.5)
Beta Globulin: 7.4 % — ABNORMAL HIGH (ref 4.7–7.2)
Total Protein ELP: 6.4 g/dL (ref 6.0–8.3)

## 2010-11-11 LAB — UIFE/LIGHT CHAINS/TP QN, 24-HR UR
Albumin, U: DETECTED
Alpha 2, Urine: DETECTED — AB
Beta, Urine: DETECTED — AB
Free Kappa/Lambda Ratio: 9.06 ratio — ABNORMAL HIGH (ref 0.46–4.00)
Free Lt Chn Excr Rate: 227.54 mg/d
Total Protein, Urine: 9.1 mg/dL
Volume, Urine: 3100 mL

## 2010-11-11 LAB — RETICULOCYTES: Retic Count, Absolute: 40.2 10*3/uL (ref 19.0–186.0)

## 2010-11-11 LAB — HEMOGLOBIN AND HEMATOCRIT, BLOOD
HCT: 21.4 % — ABNORMAL LOW (ref 39.0–52.0)
HCT: 34.6 % — ABNORMAL LOW (ref 39.0–52.0)
Hemoglobin: 11 g/dL — ABNORMAL LOW (ref 13.0–17.0)
Hemoglobin: 6.7 g/dL — CL (ref 13.0–17.0)
Hemoglobin: 6.8 g/dL — CL (ref 13.0–17.0)

## 2010-11-11 LAB — PREPARE RBC (CROSSMATCH)

## 2010-11-11 LAB — POCT CARDIAC MARKERS: Troponin i, poc: <0.05 ng/mL (ref 0.00–0.09)

## 2010-11-11 LAB — HEMOCCULT GUIAC POC 1CARD (OFFICE): Fecal Occult Bld: POSITIVE

## 2010-11-11 LAB — IRON AND TIBC: Iron: <10 ug/dL — ABNORMAL LOW (ref 42–135)

## 2010-11-11 LAB — VITAMIN B12: Vitamin B-12: 846 pg/mL (ref 211–911)

## 2010-11-11 LAB — MRSA PCR SCREENING: MRSA by PCR: NEGATIVE

## 2011-01-08 NOTE — Op Note (Signed)
NAMEKHALIF, STENDER               ACCOUNT NO.:  [id number]   MEDICAL RECORD NO.:  [id number]          PATIENT TYPE:  INP   LOCATION:  1419                         FACILITY:  [facility]   PHYSICIAN:  Boston Service, M.D.DATE OF BIRTH:  Aug 27, 1935   DATE OF PROCEDURE:  07/14/2007  DATE OF DISCHARGE:                               OPERATIVE REPORT   PREOPERATIVE DIAGNOSIS:  Thin, debilitated, 75 year old black male with  chronic obstructive pulmonary disease, alcoholic liver disease, atrial  fibrillation, creatinine of 6, postvoid residual of 1500 mL.   POSTOPERATIVE DIAGNOSIS:  Thin, debilitated, 75 year old black male with  chronic obstructive pulmonary disease, alcoholic liver disease, atrial  fibrillation, creatinine of 6, postvoid residual of 1500 mL.   PROCEDURE:  Cystoscopy, transurethral resection of the prostate, Bonanno  SP tube placement.   SURGEON:  Boston Service, M.D.   ASSISTANTS:  None.   ANESTHESIA:  Spinal.   FINDINGS:  Obstructive prostate, hypotonic bladder.   SPECIMENS:  TUR chips disposed of to Pathology.   ESTIMATED BLOOD LOSS:  Minimal.   COMPLICATIONS:  None obvious.   DESCRIPTION OF PROCEDURE:  Gratefully appreciate medical assistance of  Duane Lope M.D., [facility] in preparing Mr. Aliberti  for surgery.  He had not had a family doctor in over 5 or 6 years and  had neglected this symptomatic COPD, borderline alcoholic liver disease,  atrial fibrillation.  He was discovered to have a creatinine of 6 and a  postvoid residual of about 1500 mL as well as a PSA of 10.6.  The  patient had thoughtful medical evaluation as well as a prostate biopsy  which showed no evidence of cancer.  Plan at this point is for  cystoscopy under anesthesia, TURP and Bonanno SP tube placement.   The patient was positioned in the dorsal lithotomy table after careful  institution of spinal anesthesia.  Lucille Passy, M.D.  A well-  lubricated  21-French panendoscope was gently inserted at the urethral  meatus, normal urethra and sphincter, obstructing prostate, coapting  lateral lobes, large capacity bladder.  Orifices were relatively close  to the prostatic urethra.  Bladder filled to capacity.  Patient in steep  Trendelenburg position.  Spinal needle was passed above the symphysis  pubis directly into the bladder.  A Bonanno SP tube was then passed  along the same route as the spinal needle with immediate return of clear  irrigant.  It was sewn in place with interrupted sutures of 4-0 nylon.  Cystoscope was removed.  Resectoscope sheath was inserted.  Resection  was initiated with a single furrow at the 6 o'clock position to allow  free efflux of chips.  Resection was then begun at the 10 o'clock  position on the right lobe and carried down to the 6 o'clock position,  beginning again at the 2 o'clock position on the left lobe and carried  down to the 6 o'clock position.  A small amount of obstructing tissue  was resected anteriorly.  Once all obstructing tissue had been removed,  resection was carried down to the level of prostatic capsule.  Chips  were  irrigated free from the bladder.  A small shelf of tissue had been  created at the bladder neck.  It was incised at the 5 and 8 o'clock  position using the [employer].  Then the VaporTrode element was used  to obtain adequate hemostasis.  Once this had been achieved, the  resectoscope was withdrawn.  Ainsworth catheter was inserted with  immediate return of several hundred mL of clear irrigant, 40 mL placed  in a 30 mL balloon left to light traction.  Continuous irrigation set  up, in through the [place] SP tube and out through the [place]  catheter.  The patient was returned to recovery in satisfactory  condition where.           ______________________________  Boston Service, M.D.     RH/MEDQ  D:  07/14/2007  T:  07/14/2007  Job:  [id number]   cc:   C. Duane Lope,  M.D.  Fax: [phone]

## 2011-01-09 ENCOUNTER — Emergency Department (HOSPITAL_COMMUNITY): Payer: Medicare Other

## 2011-01-09 ENCOUNTER — Inpatient Hospital Stay (HOSPITAL_COMMUNITY)
Admission: EM | Admit: 2011-01-09 | Discharge: 2011-01-16 | DRG: 378 | Disposition: A | Discharge status: Home / Self Care | Payer: Medicare Other | Attending: Internal Medicine | Admitting: Internal Medicine

## 2011-01-09 DIAGNOSIS — Y998 Other external cause status: Secondary | ICD-10-CM

## 2011-01-09 DIAGNOSIS — I714 Abdominal aortic aneurysm, without rupture, unspecified: Secondary | ICD-10-CM | POA: Diagnosis present

## 2011-01-09 DIAGNOSIS — J4489 Other specified chronic obstructive pulmonary disease: Secondary | ICD-10-CM | POA: Diagnosis present

## 2011-01-09 DIAGNOSIS — I059 Rheumatic mitral valve disease, unspecified: Secondary | ICD-10-CM | POA: Diagnosis present

## 2011-01-09 DIAGNOSIS — N182 Chronic kidney disease, stage 2 (mild): Secondary | ICD-10-CM | POA: Diagnosis present

## 2011-01-09 DIAGNOSIS — D62 Acute posthemorrhagic anemia: Secondary | ICD-10-CM | POA: Diagnosis present

## 2011-01-09 DIAGNOSIS — Z9981 Dependence on supplemental oxygen: Secondary | ICD-10-CM

## 2011-01-09 DIAGNOSIS — K922 Gastrointestinal hemorrhage, unspecified: Principal | ICD-10-CM | POA: Diagnosis present

## 2011-01-09 DIAGNOSIS — X58XXXA Exposure to other specified factors, initial encounter: Secondary | ICD-10-CM | POA: Diagnosis present

## 2011-01-09 DIAGNOSIS — J449 Chronic obstructive pulmonary disease, unspecified: Secondary | ICD-10-CM | POA: Diagnosis present

## 2011-01-09 DIAGNOSIS — I5032 Chronic diastolic (congestive) heart failure: Secondary | ICD-10-CM | POA: Diagnosis present

## 2011-01-09 DIAGNOSIS — I509 Heart failure, unspecified: Secondary | ICD-10-CM | POA: Diagnosis present

## 2011-01-09 DIAGNOSIS — S82109A Unspecified fracture of upper end of unspecified tibia, initial encounter for closed fracture: Secondary | ICD-10-CM | POA: Diagnosis present

## 2011-01-09 LAB — COMPREHENSIVE METABOLIC PANEL
Albumin: 3.3 g/dL — ABNORMAL LOW (ref 3.5–5.2)
Alkaline Phosphatase: 68 U/L (ref 39–117)
BUN: 22 mg/dL (ref 6–23)
Chloride: 106 mEq/L (ref 96–112)
Creatinine, Ser: 1.29 mg/dL (ref 0.4–1.5)
Glucose, Bld: 84 mg/dL (ref 70–99)
Potassium: 4.6 mEq/L (ref 3.5–5.1)
Total Bilirubin: 0.5 mg/dL (ref 0.3–1.2)
Total Protein: 6.4 g/dL (ref 6.0–8.3)

## 2011-01-09 LAB — CBC
Platelets: 237 10*3/uL (ref 150–400)
RBC: 2.32 MIL/uL — ABNORMAL LOW (ref 4.22–5.81)
RDW: 18.1 % — ABNORMAL HIGH (ref 11.5–15.5)
WBC: 5.9 10*3/uL (ref 4.0–10.5)

## 2011-01-09 LAB — CK TOTAL AND CKMB (NOT AT ARMC)
CK, MB: 3.9 ng/mL (ref 0.3–4.0)
Relative Index: INVALID (ref 0.0–2.5)

## 2011-01-09 LAB — DIFFERENTIAL
Basophils Relative: 1 % (ref 0–1)
Eosinophils Absolute: 0.2 10*3/uL (ref 0.0–0.7)
Eosinophils Relative: 3 % (ref 0–5)
Lymphocytes Relative: 15 % (ref 12–46)
Lymphs Abs: 0.9 10*3/uL (ref 0.7–4.0)
Monocytes Absolute: 0.6 10*3/uL (ref 0.1–1.0)
Monocytes Relative: 10 % (ref 3–12)
Neutro Abs: 4.2 10*3/uL (ref 1.7–7.7)

## 2011-01-09 LAB — TROPONIN I: Troponin I: <0.3 ng/mL (ref <0.30)

## 2011-01-10 ENCOUNTER — Other Ambulatory Visit: Payer: Self-pay | Admitting: Gastroenterology

## 2011-01-10 LAB — CBC
Hemoglobin: 7 g/dL — ABNORMAL LOW (ref 13.0–17.0)
MCH: 25 pg — ABNORMAL LOW (ref 26.0–34.0)
Platelets: 216 10*3/uL (ref 150–400)
RBC: 2.8 MIL/uL — ABNORMAL LOW (ref 4.22–5.81)
WBC: 6 10*3/uL (ref 4.0–10.5)

## 2011-01-10 LAB — COMPREHENSIVE METABOLIC PANEL
AST: 20 U/L (ref 0–37)
CO2: 29 mEq/L (ref 19–32)
Calcium: 9 mg/dL (ref 8.4–10.5)
Creatinine, Ser: 1.23 mg/dL (ref 0.4–1.5)
GFR calc Af Amer: >60 mL/min (ref >60)
GFR calc non Af Amer: 58 mL/min — ABNORMAL LOW (ref >60)
Glucose, Bld: 96 mg/dL (ref 70–99)
Total Protein: 6 g/dL (ref 6.0–8.3)

## 2011-01-10 LAB — HEMOGLOBIN AND HEMATOCRIT, BLOOD
HCT: 27.1 % — ABNORMAL LOW (ref 39.0–52.0)
Hemoglobin: 7.3 g/dL — ABNORMAL LOW (ref 13.0–17.0)
Hemoglobin: 8.4 g/dL — ABNORMAL LOW (ref 13.0–17.0)

## 2011-01-10 LAB — PROTIME-INR: INR: 1.2 (ref 0.00–1.49)

## 2011-01-10 LAB — URINALYSIS, ROUTINE W REFLEX MICROSCOPIC
Bilirubin Urine: NEGATIVE
Glucose, UA: NEGATIVE mg/dL
Hgb urine dipstick: NEGATIVE
Ketones, ur: NEGATIVE mg/dL
Nitrite: NEGATIVE
Specific Gravity, Urine: 1.019 (ref 1.005–1.030)
pH: 5.5 (ref 5.0–8.0)

## 2011-01-10 LAB — FERRITIN
Ferritin: 20 ng/mL — ABNORMAL LOW (ref 22–322)
Ferritin: 20 ng/mL — ABNORMAL LOW (ref 22–322)

## 2011-01-10 LAB — APTT: aPTT: 37 seconds (ref 24–37)

## 2011-01-10 LAB — IRON AND TIBC
Iron: 11 ug/dL — ABNORMAL LOW (ref 42–135)
Saturation Ratios: 3 % — ABNORMAL LOW (ref 20–55)
UIBC: 395 ug/dL

## 2011-01-10 LAB — VITAMIN B12: Vitamin B-12: 543 pg/mL (ref 211–911)

## 2011-01-10 LAB — PREPARE RBC (CROSSMATCH)

## 2011-01-10 NOTE — H&P (Signed)
NAMEJEREMEY, [name]               ACCOUNT NO.:  [id number]  MEDICAL RECORD NO.:  [id number]           PATIENT TYPE:  I  LOCATION:  2003                         FACILITY:  [facility]  PHYSICIAN:  Eduard Clos, MDDATE OF BIRTH:  [birth year]  DATE OF ADMISSION:  01/09/2011 DATE OF DISCHARGE:                             HISTORY & PHYSICAL   PRIMARY CARE PHYSICIAN:  [name], MD  CHIEF COMPLAINT:  Weakness.  HISTORY OF PRESENT ILLNESS:  A 75 year old male with known history of atrial fibrillation, COPD, chronic diastolic heart failure, anemia with known history of chronic GI bleed and had EGD and colonoscopy done on January 25, 2010, which showed gastric and duodenal ulcer and colonoscopy was showing colonic diverticular with a small colonic polyp.  Biopsy of which showed tubular adenoma negative for any great malignancy.  He presented with complaints of increasing weakness over the last few days with shortness of breath on exertion.  Denies any chest pain.  Denies any cough or phlegm.  Denies any nausea, vomiting, abdominal pain, dysuria.  Denies any dizziness or loss of consciousness or any focal deficit.  In the ER, the patient was found to have a hemoglobin of around 5.4.  At this time, the patient has been admitted for transfusion and also positive for stool for occult blood.  PAST MEDICAL HISTORY:  History of chronic GI bleed as suggested earlier in the HPI.  The patient has had EGD and colonoscopy in June 2011 which showed colonic polyp which was only showing tubular adenoma and EEG was showing gastric ulcer and duodenal ulcer.  History of atrial fibrillation with history of severe mitral regurgitation, unstable.  History of chronic diastolic heart failure, history of COPD, history of hypertension, chronic kidney disease stage II.  MEDICATIONS ON ADMISSION:  Combivent 2 puffs q.8 hourly as needed, aspirin 325 mg p.o. daily, furosemide 40 mg daily, lisinopril 20 mg  2 tablets daily.  ALLERGIES:  No known drug allergies.  FAMILY HISTORY:  No history of cancer or heart disease in the family.  SOCIAL HISTORY:  The patient states he quit smoking many years ago and drinks beer once in a week, he says.  He denies any drug abuse.  REVIEW OF SYSTEMS:  As per history of present illness nothing else significant.  PHYSICAL EXAMINATION:  GENERAL:  The patient examined at bedside not in acute distress. VITAL SIGNS:  Blood pressure is 130/79, pulse 98 per minute, temperature 98.3, respiration 18, O2 sat 100%. HEENT: Anicteric.  Mild pallor.  No facial asymmetry.  Tongue is midline. CHEST:  Bilateral air entry present.  No rhonchi.  No crepitation. HEART:  S1 and S2 heard. ABDOMEN:  Soft and nontender.  Bowels sounds present. CNS:  Alert, awake, and oriented to time, place, and person. EXTREMITIES:  Moves upper and lower extremities 5/5, symmetric. Peripheral pulses good.  No edema.  LABORATORY DATA:  CBC:  WBC is 5.9, hemoglobin 5.4, hematocrit is 19, MCV 81.9, platelets 237.  PT/INR is 15.4 and 1.2.  Complete metabolic panel sodium 142, potassium 4.6, chloride 106, carbon dioxide 27, glucose 84, BUN 22, creatinine 1.2, total  bilirubin is 0.5, alkaline phosphatase is 68, AST 20, ALT 9, total protein 6.4, albumin 3.3, calcium 9.2.  CK is 99, CK-MB 3.9, troponin less than 0.3.  UA showing negative for nitrites and leukocytes, stool occult blood is positive. EKG shows normal sinus rhythm with nonspecific ST-T changes.  Chest x- ray shows chronic lung disease and cardiomegaly.  No acute cardiopulmonary abnormality.  ASSESSMENT: 1. Symptomatic severe anemia with history of chronic gastrointestinal     bleed with last EGD and colonoscopy done in June 2011. 2. History of severe mitral regurgitation. 3. History of chronic obstructive pulmonary disease. 4. History of atrial fibrillation not a candidate for Coumadin.  PLAN: 1. At this time, admit the  patient to telemetry. 2. For his severe anemia, at this time of transfer 2 units of PRBC.     Recheck CBC after transfusion.  We type and cross match 2 units and     however, we will also get a GI consult. 3. The patient is on aspirin which at this time I am going to hold and     we have to consider if we need to discontinue aspirin given his     recurrent GI bleed. 4. We will consult GI.     Eduard Clos, MD     ANK/MEDQ  D:  01/10/2011  T:  01/10/2011  Job:  [id number]  cc:   Fanny Dance. Rankins, M.D.  Electronically Signed by Midge Minium MD on 01/10/2011 07:19:29 AM

## 2011-01-11 LAB — CROSSMATCH
ABO/RH(D): B POS
Antibody Screen: NEGATIVE
Unit division: 0
Unit division: 0
Unit division: 0

## 2011-01-11 LAB — HEMOGLOBIN AND HEMATOCRIT, BLOOD: HCT: 29.7 % — ABNORMAL LOW (ref 39.0–52.0)

## 2011-01-11 NOTE — Discharge Summary (Signed)
[name], [name]               ACCOUNT NO.:  [id number]   MEDICAL RECORD NO.:  [id number]          PATIENT TYPE:  INP   LOCATION:  1419                         FACILITY:  [facility]   PHYSICIAN:  Boston Service, M.D.DATE OF BIRTH:  04-28-1936   DATE OF ADMISSION:  07/14/2007  DATE OF DISCHARGE:  07/17/2007                               DISCHARGE SUMMARY   INDICATIONS MEDICATIONS ALLERGIES TOBACCO ALCOHOL PAST MEDICAL HISTORY  SOCIAL HISTORY PHYSICAL EXAMINATION AND REVIEW OF SYSTEMS:  All outlined  in the admitting note.   HOSPITAL COURSE:  Debilitated 75 year old black male appearing older  than his stated age, history of COPD, alcoholic liver disease, atrial  fibrillation, creatinine of 6, and postvoid residual of 1500 mL, was  admitted for cystoscopy and TURP.  The patient had undergone a brief but  brisk postobstructive diuresis. Obstructive uropathy and renal  insufficiency had resolved. PSA of 10.6 had been worked up with an  office ultrasound biopsy which thankfully showed no evidence of  malignancy. Noncompliance with medical therapy continued to be a  significant problem, and we gratefully appreciate the help of the  doctors at [facility] in evaluating the patient  preoperatively.  He underwent cystoscopy, TURP and Bonanno suprapubic  tube placement on July 14, 2007.  The patient had a pleasantly  uneventful postoperative recovery. IV was diminished on postop day #1.  Continuous bladder irrigation was maintained.   By postop day #2, urine had cleared remarkably. Foley catheter was  discontinued.  IV was continued, and Bonanno suprapubic tube was used to  check postvoid residual. By postop day #3, residuals were consistently  in the 50-100 mL range.  The patient was voiding about 200-300 mL of  pink urine without clots. Plan was for discharge home. Macrobid, Vicodin  and Proscar with office visit next week to discuss pathology report and  to  discontinue Bonanno suprapubic tube.   DISCHARGE DIAGNOSES:  1. Chronic obstructive pulmonary disease.  2. Alcoholic liver disease with elevated PT and PTT.  3. Atrial fibrillation.  4. Creatinine of 6.  5. Postvoid of 1500 mL.   PLAN:  Office visit and removal of Bonanno suprapubic tube in about a  week.           ______________________________  Boston Service, M.D.     RH/MEDQ  D:  08/04/2007  T:  08/04/2007  Job:  [id number]   cc:   [name], M.D.  Fax: [phone]

## 2011-01-11 NOTE — Op Note (Signed)
NAME:  [name], [name]                         ACCOUNT NO.:  [id number]   MEDICAL RECORD NO.:  [id number]                   PATIENT TYPE:  AMB   LOCATION:  ENDO                                 FACILITY:  [facility]   PHYSICIAN:  James L. Malon Kindle., M.D.          DATE OF BIRTH:  [birth year]   DATE OF PROCEDURE:  07/19/2003  DATE OF DISCHARGE:                                 OPERATIVE REPORT   PROCEDURE:  Colonoscopy and polypectomy.   MEDICATIONS:  Fentanyl 25 micrograms, Versed 7 mg IV.   INDICATIONS FOR PROCEDURE:  History of previous adenomatous polyps.   ENDOSCOPE:  Adult Olympus colonoscope.   DESCRIPTION OF PROCEDURE:  The procedure had been explained to the patient  and consent was obtained. The patient was placed in the left lateral  decubitus position and the scope was inserted and advanced. The prep was  excellent. we were able to reach the cecum without difficulty.   The ileocecal valve and appendiceal orifice were seen. The scope was  withdrawn. The cecum was seen well. The transverse colon and splenic flexure  were seen well. In the  descending colon in the distal part at 50 cm from  the anal verge, a 1-cm sessile polyp was removed in 2 pieces and sucked  through the scope. There was no active bleeding. No other polyps were seen.  The sigmoid colon was free of polyps. There was no significant diverticular  disease.   The scope was withdrawn. The patient tolerated the procedure well.   ASSESSMENT:  Descending colon polyp, removed, 211.3.   PLAN:  Routine post polypectomy instructions. Recommend repeating the  procedure in 3 years.                                               James L. Malon Kindle., M.D.    Waldron Session  D:  07/19/2003  T:  07/19/2003  Job:  [id number]   cc:   Vikki Ports, M.D.  [address] Ervin Knack  [place]  [place] [address]  Fax: [phone]

## 2011-01-11 NOTE — Procedures (Signed)
[facility]  Patient:    DENTON, DERKS                      MRN: [id number] Proc. Date: 07/10/00 Adm. Date:  [id number] Attending:  Orland Mustard CC:         Charles A. Tenny Craw, M.D.                           Procedure Report  PROCEDURE:  Colonoscopy.  MEDICATIONS:  Fentanyl 70 mcg, Versed 7 mg IV.  INDICATIONS:  Previous history of adenomatous polyps.  DESCRIPTION OF PROCEDURE:  The procedure had been explained to the patient and consent obtained.  With the patient in the left lateral decubitus, the adult Olympus adult video colonoscope was inserted and advanced under direct visualization.  The prep was excellent.  I was able to advance around to the the cecum using abdominal pressure.  The ileocecal valve and appendiceal orifice were seen.  The scope was withdrawn.  The cecum, ascending colon, hepatic flexure, transverse colon, splenic flexure, descending, and sigmoid colon were seen well.  No polyps seen throughout the colon.  The scope was withdrawn, and the patient tolerated the procedure well maintained on low-flow oxygen and pulse oximeter throughout the procedure with no obvious problem.  ASSESSMENT:  No evidence of further polyps.  PLAN:  Will recommend repeating procedure in three years. DD:  07/10/00 TD:  07/10/00 Job: [id number]

## 2011-01-12 LAB — CBC
HCT: 29.5 % — ABNORMAL LOW (ref 39.0–52.0)
MCHC: 30.8 g/dL (ref 30.0–36.0)
MCV: 81.5 fL (ref 78.0–100.0)
Platelets: 190 10*3/uL (ref 150–400)
RDW: 17.3 % — ABNORMAL HIGH (ref 11.5–15.5)
WBC: 6.8 10*3/uL (ref 4.0–10.5)

## 2011-01-12 LAB — BASIC METABOLIC PANEL
BUN: 22 mg/dL (ref 6–23)
Calcium: 8.8 mg/dL (ref 8.4–10.5)
GFR calc non Af Amer: 56 mL/min — ABNORMAL LOW (ref >60)
Glucose, Bld: 67 mg/dL — ABNORMAL LOW (ref 70–99)
Sodium: 139 mEq/L (ref 135–145)

## 2011-01-13 ENCOUNTER — Inpatient Hospital Stay (HOSPITAL_COMMUNITY): Payer: Medicare Other

## 2011-01-13 LAB — BASIC METABOLIC PANEL
Calcium: 8.8 mg/dL (ref 8.4–10.5)
GFR calc Af Amer: >60 mL/min (ref >60)
GFR calc non Af Amer: 55 mL/min — ABNORMAL LOW (ref >60)
Potassium: 3.7 mEq/L (ref 3.5–5.1)
Sodium: 139 mEq/L (ref 135–145)

## 2011-01-13 LAB — CBC
MCHC: 30.6 g/dL (ref 30.0–36.0)
RDW: 17.5 % — ABNORMAL HIGH (ref 11.5–15.5)
WBC: 8.3 10*3/uL (ref 4.0–10.5)

## 2011-01-13 LAB — MAGNESIUM: Magnesium: 1.9 mg/dL (ref 1.5–2.5)

## 2011-01-13 MED ORDER — IOHEXOL 300 MG/ML  SOLN: 100.0000 mL | Freq: Once | INTRAMUSCULAR | Status: AC | PRN

## 2011-01-14 LAB — BASIC METABOLIC PANEL
Calcium: 9 mg/dL (ref 8.4–10.5)
GFR calc Af Amer: >60 mL/min (ref >60)
GFR calc non Af Amer: 56 mL/min — ABNORMAL LOW (ref >60)
Sodium: 137 mEq/L (ref 135–145)

## 2011-01-14 LAB — TISSUE TRANSGLUTAMINASE, IGA: Tissue Transglutaminase Ab, IgA: 4.5 U/mL (ref <20)

## 2011-01-15 DIAGNOSIS — Z01810 Encounter for preprocedural cardiovascular examination: Secondary | ICD-10-CM

## 2011-01-15 DIAGNOSIS — I714 Abdominal aortic aneurysm, without rupture, unspecified: Secondary | ICD-10-CM

## 2011-01-15 DIAGNOSIS — I723 Aneurysm of iliac artery: Secondary | ICD-10-CM

## 2011-01-15 LAB — CBC
MCH: 24.9 pg — ABNORMAL LOW (ref 26.0–34.0)
Platelets: 164 10*3/uL (ref 150–400)
RBC: 3.78 MIL/uL — ABNORMAL LOW (ref 4.22–5.81)
WBC: 7 10*3/uL (ref 4.0–10.5)

## 2011-01-16 DIAGNOSIS — I719 Aortic aneurysm of unspecified site, without rupture: Secondary | ICD-10-CM

## 2011-01-16 DIAGNOSIS — M79609 Pain in unspecified limb: Secondary | ICD-10-CM

## 2011-01-21 NOTE — Discharge Summary (Signed)
[name], [name]               ACCOUNT NO.:  [id number]  MEDICAL RECORD NO.:  [id number]           PATIENT TYPE:  I  LOCATION:  2003                         FACILITY:  [facility]  PHYSICIAN:  Brendia Sacks, MD    DATE OF BIRTH:  Apr 06, 1936  DATE OF ADMISSION:  01/09/2011 DATE OF DISCHARGE:  01/16/2011                        DISCHARGE SUMMARY - REFERRING   PRIMARY CARE PHYSICIAN:  Dr. Benetta Spar Rankins.  CONDITION ON DISCHARGE:  Improved.  DISCHARGE DIAGNOSES: 1. Recurrent occult gastrointestinal bleed. 2. Subacute blood loss anemia, status post 4 units packed red blood     cells. 3. Abdominal aortic aneurysm and bilateral iliac aneurysms. 4. Right lower lobe spiculated nodule. 5. Medial tibial plateau fracture. 6. History of atrial fibrillation, not a Coumadin candidate secondary     to recurrent microcytic anemia and acute or occult gastrointestinal     bleed. 7. History of chronic diastolic congestive heart failure, stable.8. History of oxygen-dependent chronic obstructive pulmonary disease,     stable.  HISTORY OF PRESENT ILLNESS:  This is a 75 year old man who presented to the emergency room with weakness.  He has a history of known chronic GI bleeds without a definitive source.  He had undergone EGD and colonoscopy in 2011 which showed gastric and duodenal ulcer.  However, it appears this was not thought to explain his bleeding.  Review of his record does demonstrate recurrent microcytic anemia.  He was admitted for transfusion and further evaluation.  HOSPITAL COURSE: 1. Occult GI bleed, subacute with subacute blood loss.  The patient     was admitted and received total transfusion of 4 units packed red     blood cells.  His hemoglobin has remained stable since that time.     He has had no obvious blood loss.  He was seen in consultation with     Dr. Toni Arthurs of Gastroenterology.  Given his history of colonoscopy,     this was not repeated.  Upper endoscopy was  repeated and showed     minimal ulceration erythema in the duodenal bulb which was not     thought to explain his anemia.  He also underwent capsule endoscopy     which did show a few angiectasias of the small bowel but again     there was some question whether this could explain his anemia per     Gastroenterology.  Hematology consultation was recommended.  Note,     CT enterography was also obtained which did not show any etiology     for blood loss but did demonstrate newly found abdominal aortic     aneurysm as well as bilateral common iliac aneurysms.  There is no     evidence of fistula.  As his bleeding has remained stable, from     this standpoint, his hemoglobin remained stable  and is stable for     discharge tomorrow.  I have arranged for follow-up with Hematology     for further evaluation from that standpoint for his anemia. 2. Abdominal aortic aneurysm and common iliac aneurysms, is fairly     large.  I have discussed case with  Dr. Darrick Penna today who kindly     plans to see the patient in the hospital to provide definitive     recommendations and treatment plan.  Unless operative or     endovascular intervention is planned during this hospitalization,     we will anticipate discharge home tomorrow. 3. Right lower lobe spiculated nodule.  This was seen on CT     enterography.  PET scan is recommended in the outpatient setting     and it can be performed at the patient's primary care physician's     office. 4. Medial tibial plateau fracture on the left.  He has had knee pain     while he was in the hospital.  He sustained no fall, no trauma.  He     reported that this has been spontaneous.  X-ray did demonstrate a     tibial plateau fracture.  The patient was seen by Dr. Shelle Iron in     consultation and he was recommended nonweightbearing status for     that left leg and knee immobilizer.  The patient has been evaluated     by Physical Therapy and given those limitations and using  rolling     walker, it is recommended that he can be discharged home with home     health and physical therapy.  He will follow up with Dr. Shelle Iron in     approximately 2 weeks for further evaluation and recommendations.  CONSULTATIONS: 1. Dr. Darrick Penna of Vascular Surgery. 2. Dr. Bosie Clos of Gastroenterology. 3. Dr. Shelle Iron of Orthopedic Surgery.  PROCEDURES: 1. Upper endoscopy 05/17:  Minimal ulceration and erythema and     duodenal bulb consistent with duodenitis, status post biopsies,     check for inflammation.  No active bleeding seen.  No source of     anemia seen. 2. Small bowel capsule endoscopy May 20:  Few angiectasis.  Recommend     using iron.  No source of anemia found.  IMAGING: 1. Chest x-ray on May 16:  Chronic lung disease and cardiomegaly.  No     acute cardiopulmonary abnormality. 2. Left knee film on May 20:  Large joint effusion.  Findings     worrisome for a medial tibial plateau compression fracture. 3. CT enterography on May 20:  Abdominal aortic aneurysm 5.8 cm.     Right common iliac artery aneurysm 5.3 cm.  Left common iliac     artery aneurysm 3.7 cm.  Small spiculated density in the right     lower lobe.  Past CT warranted.  MICROBIOLOGY:  None.  PERTINENT LABORATORY STUDIES: 1. CBC notable for hemoglobin of 5.4 on admission, on discharge 9.4     and stable.  Review of the patient's record demonstrates     intermittent and microcytic anemia dating back to June 2011. 2. Basic metabolic panel unremarkable. 3. Anemia panel notable for low iron. 4. Urinalysis negative. 5. Tissue transglutaminase antibody, IgA 4.5. 6. Fecal occult blood positive.  PHYSICAL EXAMINATION:  GENERAL:  The patient is feeling well.  No shortness of breath.  No bleeding.  He complains of left knee pain but otherwise feels well. VITAL SIGNS:  Maximum temperature is 100.3; pulse 96, respirations 18, blood pressure 128/83, sat 98% on 2 L of cannula, nontoxic in appearance.  Speech  fluent and clear. CARDIOVASCULAR:  Regular rate and rhythm.  No murmur, rub, or gallop. RESPIRATORY:  Clear to auscultation bilaterally.  No wheezes, rales or rhonchi.  DISCHARGE INSTRUCTIONS:  The patient will be discharged home with home health and physical therapy.  ACTIVITIES:  Use rolling walker and advance slowly as tolerated, nonweightbearing to the left leg until cleared by orthopedic surgeon. Wearing left knee immobilizer except when bathing and lying in bed, icing knee otherwise to keep on.  DIET:  Heart-healthy diet.  FOLLOW-UP:  Hematology.  They will call you for an appointment within the next week.  Follow up with Dr. Darrick Penna as directed.  Follow up with your primary care physician, Dr. Barbaraann Barthel, approximately in 1 week. Follow up with Dr. August Saucer in approximately 1-2 weeks for repeat x-ray and further evaluation.  DISCHARGE MEDICATIONS: 1. Iron 325 mg p.o. t.i.d. before meals. 2. Lortab 5/325 one tablet every 4 hours as needed for pain #30, no     refills. 3. Protonix 40 mg p.o. daily. 4. Combivent 2 puffs every 8 hours as needed for shortness of breath. 5. Lasix 40 mg p.o. daily. 6. Lisinopril 20 mg 2 tablets p.o. daily.  Discontinue aspirin.  Things to follow up in the outpatient setting. 1. See body of dictation above. 2. Right lower lobe spiculated nodule.  Consider PET scan or     outpatient pulmonology evaluation.  Time coordinating discharge 35 minutes.     Brendia Sacks, MD     DG/MEDQ  D:  01/15/2011  T:  01/15/2011  Job:  [id number]  cc:   Fanny Dance. Rankins, M.D.  Electronically Signed by Brendia Sacks  on 01/21/2011 04:50:16 PM

## 2011-01-24 NOTE — Op Note (Signed)
  [name], [name]               ACCOUNT NO.:  [id number]  MEDICAL RECORD NO.:  [id number]           PATIENT TYPE:  I  LOCATION:  2003                         FACILITY:  [facility]  PHYSICIAN:  Shirley Friar, MDDATE OF BIRTH:  [birth year]  DATE OF PROCEDURE: DATE OF DISCHARGE:                              OPERATIVE REPORT   INDICATION:  Anemia.  MEDICATIONS:  Fentanyl 50 mcg IV, Versed 5 mg IV, Cetacaine spray x2.  FINDINGS:  Endoscope was inserted through the oropharynx and esophagus was intubated which was normal in appearance on insertion.  The endoscope was advanced down into the stomach which revealed normal- appearing gastric mucosa.  Retroflexion was done which revealed normal proximal stomach.  The endoscope was straightened and advanced into the duodenal bulb which revealed a segmental area of minimal edema and erythema as well as a few superficial ulcerations without any active bleeding.  Endoscope was advanced to the second portion of the duodenum which was unremarkable.  Endoscope was withdrawn back into the duodenal bulb and biopsies were taken for histologic purposes.  Endoscope was withdrawn back into the stomach and biopsies were taken of the distal stomach for histologic purposes.  On further withdrawal back into the esophagus, the gastroesophageal junction was irregular and there was a short finger of what appeared to be Barrett's esophagus at the gastroesophageal junction and the gastroesophageal junction was 46 cm from incisors.  There was no bleeding stigmata seen and this segment was short and not biopsied at that time.  Endoscope was withdrawn to confirm above findings.  ASSESSMENT: 1. Minimal ulceration and erythema in duodenal bulb consistent with     duodenitis status post biopsies to check for inflammation. 2. No active bleeding seen. 3. No source of anemia seen.  PLAN:  Proceed with capsule endoscopy.     Shirley Friar,  MD     VCS/MEDQ  D:  01/10/2011  T:  01/11/2011  Job:  [id number]  Electronically Signed by Charlott Rakes MD on 01/24/2011 11:48:58 PM

## 2011-01-24 NOTE — Consult Note (Signed)
NAMEBURNARD, [name]               ACCOUNT NO.:  [id number]  MEDICAL RECORD NO.:  [id number]           PATIENT TYPE:  I  LOCATION:  2003                         FACILITY:  [facility]  PHYSICIAN:  Jene Every, M.D.    DATE OF BIRTH:  [birth year]  DATE OF CONSULTATION:  01/13/2011 DATE OF DISCHARGE:                                CONSULTATION   CHIEF COMPLAINT:  Left knee pain.  HISTORY:  This is a 75 year old gentleman with history of atrial fibrillation, CHF, COPD, and anemias and chronic GI bleeding who was admitted 3 days previous for GI bleed.  He underwent endoscopy.  He was indicated the knee pain yesterday.  X-rays were taken today, which indicated a medial tibial plateau by report.  He has had large effusion, painful weightbearing.  I was consulted for evaluation.  He has reported no trauma, has not striking the knee, no chronic history of knee pain.  No hip or ankle pain.  No fevers or chills.  PAST MEDICAL HISTORY:  Significant for: 1. Atrial fib. 2. COPD. 3. CHF. 4. Hypertension. 5. Chronic kidney disease, stage II.  MEDICATIONS:  Combivent, aspirin, furosemide, lisinopril.  ALLERGIES:  None.  FAMILY HISTORY:  Noncontributory.  SOCIAL HISTORY:  Negative tobacco, alcohol occasional.  Denies drug use.  PHYSICAL EXAMINATION:  VITAL SIGNS:  The patient currently is afebrile, temperature 98.4, pulse 97, blood pressure 118/78. HEENT:  Within normal limits. COR:  Regular rate and rhythm. PULMONARY:  Clear to auscultation. ABDOMEN:  Soft and nontender.  Pelvis is stable. EXTREMITIES:  Nontender lumbar spine.  Left knee has got a moderate effusion.  There is no erythema or cellulitis.  He is tender over the proximal medial tibial plateau with slight varus deformity.  He is nontender laterally.  He is able to perform an active straight leg raise, no evidence of DVT, 1+ dorsalis pedis and posterior tibial pulse. Sensory exam is intact.  Ipsilateral hip and ankle exams  are unremarkable.  His compartments are soft.  He has hemoglobin of 8.9 and 24.9 hematocrit, white blood cell count is 8.3.  His creatinine is 1.28.  INR of 1.2.  Radiographs of the knee demonstrates a large joint effusion, possible medial tibial plateau, compression fracture, although interestingly on the images himself, the area of concern denoted by an area of concern is over the medial tibial plateau.  There was underlying osteopenia.  No pathologic appearances of the bone is noted.  There is a minimal depression of the tibial plateau in the AP and lateral planes.  IMPRESSION: 1. Probable medial tibial plateau fracture, nondisplaced slight     depression, underlying osteopenia. 2. Multiple comorbidities with an atrial fibrillation. 3. Hypertension. 4. Chronic renal failure. 5. Underlying osteoporosis.  PLAN:  I recommend a conservative treatment to include nonweightbearing, use of right knee immobilizer while he in bed, rest, ice, elevation and can open up the immobilizer while in bed for application of the ice or washing of the extremity.  When the patient is out of bed, he should be in knee immobilizer, simply secured utilizing a walker and nonweightbearing.  We will x-ray him in  1-2 weeks for a repeat evaluation.  There was minimal chance for requiring open reduction and internal fixation.  Again, he is not anticoagulated.  At this point in time, analgesics is necessary.     Jene Every, M.D.     Cordelia Pen  D:  01/13/2011  T:  01/14/2011  Job:  [id number]  Electronically Signed by Jene Every M.D. on 01/24/2011 09:42:00 AM

## 2011-01-24 NOTE — Consult Note (Signed)
  NAMEARYON, NHAM               ACCOUNT NO.:  [id number]  MEDICAL RECORD NO.:  [id number]           PATIENT TYPE:  LOCATION:                                 FACILITY:  PHYSICIAN:  Shirley Friar, MDDATE OF BIRTH:  07-27-1936  DATE OF CONSULTATION: DATE OF DISCHARGE:                                CONSULTATION   REQUESTING PHYSICIAN:  Eduard Clos, MD  REASON:  Anemia.  HISTORY OF PRESENT ILLNESS:  Mr. Moore is a pleasant 75 year old black male with a known history of anemia who was worked up in June 2011 and was found to have a small gastroesophageal junction ulcer and possible reflux esophagitis.  He also had a colonoscopy which was unrevealing for source of his anemia.  He did have three small adenomatous polyps removed at that time.  He had internal hemorrhoids noted as well as a few sigmoid diverticula seen.  He is being admitted secondary to weakness and was found to have a hemoglobin of 5.4.  He was heme- positive on admission without any visible bleeding.  He denies any abdominal pain, nausea, vomiting, heartburn, weight loss, or change in bowel habits.  He also denies any NSAIDS.  His hemoglobin is 5.4 on admission and has gone to 7.3 status post 2 units of packed red blood cells.  His MCV is 81, ferritin of 20, percent sat of 3%, iron level of 11.  PAST MEDICAL HISTORY: 1. History of anemia stated above. 2. History of atrial fibrillation. 3. History of severe mitral regurgitation. 4. CHF. 5. COPD. 6. Hypertension. 7. Chronic kidney disease.  MEDICATIONS ON ADMISSION:  Aspirin, furosemide, lisinopril.  ALLERGIES:  No known drug allergies.  FAMILY HISTORY:  Noncontributory.  SOCIAL HISTORY:  Denies alcohol.  Denies drugs.  Former smoker.  REVIEW OF SYSTEMS:  Negative from GI standpoint except as stated above.  PHYSICAL EXAMINATION:  VITAL SIGNS:  Temperature 97.6, pulse 88, blood pressure 128/83. GENERAL:  Elderly, alert, well nourished,  no acute stress ABDOMEN:  Soft, nontender, nondistended.  Active bowel sounds.  LABORATORY DATA:  White blood count 6.0, hemoglobin 7.3, platelet count 216.  INR 1.2.  Heme-positive, additional labs noted in the hospital record were reviewed including a ferritin of 20, percent sat of 3%, iron level of 11.  IMPRESSION:  A 75 year old black male with obscure occult gastrointestinal bleeding with no source of his anemia seen on endoscopy and colonoscopy in June 2011.  We will repeat his upper endoscopy today, and if negative the patient will need a small bowel capsule endoscopy tomorrow.  I do not think he needs a repeat colonoscopy at this time. If his endoscopy and capsule endoscopy are negative, then I will recommend a hematologic workup as the next step.  Thank you for this consultation.     Shirley Friar, MD     VCS/MEDQ  D:  01/10/2011  T:  01/11/2011  Job:  [id number]  Electronically Signed by Charlott Rakes MD on 01/24/2011 11:49:42 PM

## 2011-01-28 ENCOUNTER — Encounter (HOSPITAL_COMMUNITY): Payer: Medicare Other

## 2011-01-29 LAB — URINALYSIS, ROUTINE W REFLEX MICROSCOPIC
Bilirubin Urine: NEGATIVE
Nitrite: NEGATIVE
Specific Gravity, Urine: 1.024 (ref 1.005–1.030)
Urobilinogen, UA: 1 mg/dL (ref 0.0–1.0)
pH: 5 (ref 5.0–8.0)

## 2011-01-29 LAB — TYPE AND SCREEN: Antibody Screen: NEGATIVE

## 2011-01-29 LAB — BLOOD GAS, ARTERIAL
FIO2: 0.28 %
pCO2 arterial: 38.3 mmHg (ref 35.0–45.0)
pH, Arterial: 7.429 (ref 7.350–7.450)
pO2, Arterial: 107 mmHg — ABNORMAL HIGH (ref 80.0–100.0)

## 2011-01-29 LAB — CBC
Platelets: 216 10*3/uL (ref 150–400)
RBC: 4.02 MIL/uL — ABNORMAL LOW (ref 4.22–5.81)
RDW: 18.1 % — ABNORMAL HIGH (ref 11.5–15.5)
WBC: 6.9 10*3/uL (ref 4.0–10.5)

## 2011-01-29 LAB — COMPREHENSIVE METABOLIC PANEL
ALT: 9 U/L (ref 0–53)
AST: 19 U/L (ref 0–37)
Albumin: 3.3 g/dL — ABNORMAL LOW (ref 3.5–5.2)
Alkaline Phosphatase: 72 U/L (ref 39–117)
Chloride: 106 mEq/L (ref 96–112)
GFR calc Af Amer: >60 mL/min (ref >60)
Potassium: 4.4 mEq/L (ref 3.5–5.1)
Sodium: 141 mEq/L (ref 135–145)
Total Protein: 6.6 g/dL (ref 6.0–8.3)

## 2011-01-29 LAB — PROTIME-INR: INR: 1.2 (ref 0.00–1.49)

## 2011-01-29 LAB — URINE MICROSCOPIC-ADD ON

## 2011-01-29 LAB — SURGICAL PCR SCREEN: MRSA, PCR: NEGATIVE

## 2011-01-30 ENCOUNTER — Inpatient Hospital Stay (HOSPITAL_COMMUNITY): Payer: Medicare Other

## 2011-01-30 ENCOUNTER — Inpatient Hospital Stay (HOSPITAL_COMMUNITY)
Admission: RE | Admit: 2011-01-30 | Discharge: 2011-01-31 | DRG: 238 | Disposition: A | Discharge status: Home / Self Care | Payer: Medicare Other | Source: Ambulatory Visit | Attending: Vascular Surgery | Admitting: Vascular Surgery

## 2011-01-30 DIAGNOSIS — N189 Chronic kidney disease, unspecified: Secondary | ICD-10-CM | POA: Diagnosis present

## 2011-01-30 DIAGNOSIS — I4891 Unspecified atrial fibrillation: Secondary | ICD-10-CM | POA: Diagnosis present

## 2011-01-30 DIAGNOSIS — I059 Rheumatic mitral valve disease, unspecified: Secondary | ICD-10-CM | POA: Diagnosis present

## 2011-01-30 DIAGNOSIS — Z87891 Personal history of nicotine dependence: Secondary | ICD-10-CM

## 2011-01-30 DIAGNOSIS — I714 Abdominal aortic aneurysm, without rupture, unspecified: Principal | ICD-10-CM | POA: Diagnosis present

## 2011-01-30 DIAGNOSIS — J4489 Other specified chronic obstructive pulmonary disease: Secondary | ICD-10-CM | POA: Diagnosis present

## 2011-01-30 DIAGNOSIS — I129 Hypertensive chronic kidney disease with stage 1 through stage 4 chronic kidney disease, or unspecified chronic kidney disease: Secondary | ICD-10-CM | POA: Diagnosis present

## 2011-01-30 DIAGNOSIS — I723 Aneurysm of iliac artery: Secondary | ICD-10-CM | POA: Diagnosis present

## 2011-01-30 DIAGNOSIS — I5032 Chronic diastolic (congestive) heart failure: Secondary | ICD-10-CM | POA: Diagnosis present

## 2011-01-30 DIAGNOSIS — J449 Chronic obstructive pulmonary disease, unspecified: Secondary | ICD-10-CM | POA: Diagnosis present

## 2011-01-30 LAB — CBC
Hemoglobin: 8.5 g/dL — ABNORMAL LOW (ref 13.0–17.0)
MCHC: 29.5 g/dL — ABNORMAL LOW (ref 30.0–36.0)
RBC: 3.56 MIL/uL — ABNORMAL LOW (ref 4.22–5.81)
WBC: 7 10*3/uL (ref 4.0–10.5)

## 2011-01-30 LAB — BASIC METABOLIC PANEL
BUN: 16 mg/dL (ref 6–23)
GFR calc Af Amer: >60 mL/min (ref >60)
GFR calc non Af Amer: >60 mL/min (ref >60)
Potassium: 4.2 mEq/L (ref 3.5–5.1)

## 2011-01-30 LAB — PROTIME-INR
INR: 1.37 (ref 0.00–1.49)
Prothrombin Time: 17.1 seconds — ABNORMAL HIGH (ref 11.6–15.2)

## 2011-01-30 LAB — MAGNESIUM: Magnesium: 1.8 mg/dL (ref 1.5–2.5)

## 2011-01-31 ENCOUNTER — Encounter: Payer: Medicare Other | Admitting: Oncology

## 2011-01-31 LAB — BASIC METABOLIC PANEL
BUN: 17 mg/dL (ref 6–23)
GFR calc Af Amer: >60 mL/min (ref >60)
GFR calc non Af Amer: 58 mL/min — ABNORMAL LOW (ref >60)
Potassium: 4.3 mEq/L (ref 3.5–5.1)
Sodium: 139 mEq/L (ref 135–145)

## 2011-01-31 LAB — CBC
MCV: 80.2 fL (ref 78.0–100.0)
Platelets: 163 10*3/uL (ref 150–400)
RDW: 18.3 % — ABNORMAL HIGH (ref 11.5–15.5)
WBC: 8 10*3/uL (ref 4.0–10.5)

## 2011-02-01 ENCOUNTER — Encounter: Payer: Self-pay | Admitting: Vascular Surgery

## 2011-02-01 ENCOUNTER — Other Ambulatory Visit: Payer: Self-pay | Admitting: Vascular Surgery

## 2011-02-01 DIAGNOSIS — I714 Abdominal aortic aneurysm, without rupture: Secondary | ICD-10-CM

## 2011-02-01 NOTE — Op Note (Signed)
NAMEMARIEL, [name]               ACCOUNT NO.:  [id number]  MEDICAL RECORD NO.:  [id number]  LOCATION:  3310                         FACILITY:  [facility]  PHYSICIAN:  [name] Hora. Xuan Mateus, MD  DATE OF BIRTH:  October 09, 1935  DATE OF PROCEDURE:  01/30/2011 DATE OF DISCHARGE:  01/31/2011                              OPERATIVE REPORT   PROCEDURE: 1. Placement of Gore excluder aneurysm stent graft for repair of     abdominal aortic and iliac aneurysms. 2. Coil embolization of right internal iliac artery. 3. ProGlide percutaneous closure of both groins.  PREOPERATIVE DIAGNOSIS:  Abdominal aortic and iliac aneurysms.  POSTOPERATIVE DIAGNOSIS:  Abdominal aortic and iliac aneurysms.  ANESTHESIA:  Local with IV sedation.  ASSISTANT:  Pecola Leisure, PA  OPERATIVE FINDINGS: 1. Percutaneous deployment of aneurysm stent graft. 2. Closure of both femoral arteries with ProGlide suture device. 3. 28 x 12 x 18-cm Gore excluder main body delivered via left leg. 4. Contralateral leg 12 x 14 cm limb. 5. 12 x 7 iliac extender left and right limb. 6. 28 x 3.3 cm proximal cuff followed by 26 x 3.3 cm proximal cuff.  OPERATIVE DETAILS:  After obtaining informed consent, the patient was taken to the operating room.  The patient was placed in supine position on operating table.  After adequate sedation, the patient was prepped and draped in usual sterile fashion from the nipples to the knees. Ultrasound was used to identify the right common femoral artery.  This had some islands of calcium but overall was fairly healthy in appearance.  Local anesthesia in the form of 1% lidocaine with epinephrine was infiltrated over the area of the artery.  An introducer needle was then used to cannulate the right common femoral artery under ultrasound guidance.  There was good pulsatile backflow.  An 0.035 Benson wire was then threaded into the abdominal aorta under fluoroscopic guidance.  A ProGlide suture device was  then brought up on the operative field and advanced over the guidewire into the artery. The guidewire was removed and the device pulled back until there was good pulsatile flow.  The footplate was then deployed and the sutures deployed.  The device was then pulled back and the Bentson wire re- advanced over a guide wire back up into the abdominal aorta.  The sutures were tacked and placed on the right side of the abdomen.  The suture device was fired in a 10 o'clock position.  An additional ProGlide device was then brought up in the operative field and advanced over the guidewire and fired at the 2 o'clock position in similar fashion.  Next, the guidewire was left in place and a long 9-French sheath placed over the guidewire and left in place in the right iliac system.  Attention was then turned to the left groin.  In similar fashion, the left common femoral artery was identified with ultrasound. It was a good pulsatile quality with some calcified islands.  Local anesthesia was infiltrated over the left common femoral artery.  A small nick was made in the skin with 11 blade and forceps were used to bluntly dissect down to the level of the artery under ultrasound guidance. Introducer  needle was then used to cannulate the left common femoral artery and an 0.035 Bentson wire placed into the left iliac system under fluoroscopic guidance similarly to the right side.  ProGlide device was then brought up in the operative field and this was again fired at the 10 o'clock position in the usual fashion and in similar fashion an additional ProGlide device was placed over the guidewire and fired at 2 o'clock position.  A short 9-French sheath was then placed over the guidewire into the left femoral system.  At this point, attention was turned back to the right side.  There was a fair amount of tortuosity in the abdominal aorta as well as the iliac system.  At this point, an attempt was made to direct  the Bentson wire up into the abdominal aorta and this required the use of Kumpe catheter. During the course of the case any real manipulation of the wire to advance up into the abdominal aorta required a directional catheter in order to navigate the tortuosity.  The catheter was advanced up into the upper abdominal aorta and an abdominal aortogram was obtained.  This showed patent right and left renal arteries with a small accessory right renal artery.  There was flow into the right internal iliac artery that was fairly brisk with a fairly wide orifice.  The internal iliac artery was coming off a large right common iliac aneurysm.  Additionally, there were some flow into the left internal iliac artery but this had a fairly high-grade stenosis at least 80% at the origin and the flow was less brisk on this side.  Attention was then turned to coil embolizing the right internal iliac artery.  This was done by first advancing the Bentson wire from the left groin up into the abdominal aorta and using a crossover catheter I was able to selectively catheterize the right common iliac artery and advance the guidewire down into the right common iliac aneurysm.  With some manipulation, I was able to advance the crossover catheter down near the orifice of the right internal iliac artery.  This took some manipulation, but I was able to selectively catheterize the right internal iliac artery and advance a 0.035 angled Glidewire deep into the left internal iliac artery.  At this point, the crossover catheter was advanced deep over the Glidewire into the right internal iliac artery.  We then began to coil embolize the right internal iliac artery.  The Glidewire was removed and using a Bentson wire, three 8 Nester coils were advanced into the right internal iliac artery system.  Contrast was used to identify how much flow was proceeding distally after the coil embolization.  There was minimal flow.  It  appeared that the embolization was fairly complete and at this point we directed attention back to the left groin.  A 5-French crossover catheter was advanced from the right groin over an 0.035 Bentson wire and this was used to selectively catheterize the left common iliac artery.  I then advanced an 0.035 angled Glidewire down into the left external iliac artery.  At this point, a Motarjeme catheter was brought up in the operative field and several attempts were made to selectively catheterize the left internal iliac artery.  I tried this from a retrograde approach from the ipsilateral groin as well as several attempts using the Motarjeme catheter from the contralateral groin.  There was a large amount of manipulation of the catheter and the wire but despite this and even with contrast  guidance to the orifice of the left internal iliac artery I was unable to catheterize this most likely due to his high-grade stenosis at the origin.  At this point, attempts were abandoned and it was planned to cover the left internal iliac artery and that this should thrombose due to diminished flow baseline.  At this point, the Kumpe catheter was brought back up in the operative field and this was advanced over a Bentson wire up into the descending thoracic aorta.  Through the Kumpe catheter, I placed an 0.035 Lunderquist wire to straighten some of the tortuosity of the right iliac system as well as the aorta.  From the left side, the Kumpe catheter was then exchanged and again using a Bentson wire, the Kumpe catheter was advanced all the way up to the level of the descending thoracic aorta.  The 9-French sheaths on each side were then exchanged for a 12-French large sheath on the right side over the Lunderquist wire and an 18-French large sheath in the left groin over an Amplatz wire. Contrast angiogram was again performed to determine precise level of the renal arteries.  At this point, a 28 x 12 x  18-cm Gore excluder stent graft was brought up in the operative field and advanced from the left groin through the 18-French sheath up to the level of the renal arteries.  This was then deployed in its proximal aspect just below the level of the renal arteries.  There was good apposition to the wall. However, we noted distally that the contralateral limb had not opened completely again due to tortuosity as the stent graft passed around the corner of a sharp turn.  The distal area of the iliac limb had opened somewhat, so we tried to selectively catheterize the left common iliac artery and advance a wire through this.  This turned out to be a very tedious procedure and we spent approximately 90 minutes trying to selectively catheterize and advance a wire up into the contralateral gate.  After lengthy attempts at this, it was decided that we would need to pull the stent graft down below the renal arteries further so the contralateral limb could be deployed fully.  The proximal aspect of the stent graft was re-constrained and the stent graft pulled down approximately 3 cm below the renal arteries.  At this point, the contralateral limb popped open.  We were then able with minimal manipulation using a Bernstein catheter to selectively catheterize the contralateral limb and advance the guidewire back up into the descending thoracic aorta.  A 5-French Omni flush catheter was then brought up in the operative field and advanced over the Glidewire into the main sac of the excluder graft.  This was then twirled to confirm we were within the limb of the graft.  At this point, this was then exchanged again over the Kumpe catheter and an 0.035 Amplatz wire advanced into the descending thoracic aorta.  At this point, a 12 x 14 cm contralateral limb was brought up in the operative field and this was advanced with full overlap of the long marker and deployed.  There was good apposition to the wall.  The  proximal end of the stent graft was then again unconstrained prior to this so that the wire could pass freely through the graft.  Next, an iliac extender 12 x 7 in length was brought up in the operative field and advanced over again the right guidewire and extended down into the right iliac system so  that the entire iliac bifurcation was covered and the stent extended into the right external iliac artery.  Attention was then turned back to the ipsilateral groin. The remainder of the ipsilateral limb was fully deployed and the delivery device removed, and additional 12 x 7 iliac extender was then brought up on the operative field and the left iliac limb was also extended down past the iliac bifurcation into the external iliac artery. This delivery device was also removed.  At this point, a Gore balloon was brought up in the operative field and everything was ballooned proximally and distally to all attachment sites and distal fixation points.  The Omni flush catheter was then advanced back over a guidewire on the right side and a completion angiogram was obtained.  There was fairly good apposition to the wall but there still appeared to be some contrast that could possibly extravasate and cause a type 1 proximal leak from where we had pulled the graft down.  At this point, both internal iliac arteries were covered without any proximal endoleak into these.  There was some filling of the right internal iliac artery from a distal branch most likely of the external iliac artery but this did not flow into the aneurysm.  We decided at this point to place two proximal cuffs so that we build the graft back up to the level of renal arteries. This was first done by placing a 28 x 3.3-cm cuff from the left groin approach all the way up to the proximal end of the graft and this was deployed with 50% overlap.  An additional 26 x 3.3-cm cuff was then brought up in the operative field, position of the  renal arteries was again confirmed with contrast angiography and this was also deployed. All of this was then re-ballooned proximally.  The 5-French Omni-flush sheath was then advanced over the Amplatz wire from the left groin approach and a completion arteriogram showed patent left and right renal arteries.  The right accessory renal artery had been covered intentionally to get good apposition proximally.  There was no type 1 or type 2 endoleak at this point.  The right and left internal iliac arteries were also excluded.  At this point, the sheath was removed from the right groin.  The wire was left in place until the two ProGlide sutures could be laid down.  There was still some leaking from the right groin and the suture had broken on the 2 o'clock position ProGlide, so an additional ProGlide was brought up in the operative field and this was then again deployed in usual fashion.  Once the sutures had been cinched down on the right side, there was good hemostasis at this point with good pulse in the femoral artery.  A 4-0 Vicryl subcuticular stitch was placed in the right groin incision and Dermabond applied.  Attention was then turned to the left groin.  In similar fashion, the sheath was removed and the ProGlide sutures cinched down.  There was good hemostasis at this point on the other side and the guidewire was removed and the sutures finally cinched down and cut and again a 4-0 Vicryl subcuticular stitch placed in the skin with Dermabond.  The patient tolerated the procedure well and there were no complications. Instrument, sponge and needle counts were correct at the end of the case.  The patient was taken to recovery room in stable condition.     [name] Hora. Alondra Vandeven, MD     CEF/MEDQ  D:  01/31/2011  T:  01/31/2011  Job:  [id number]  Electronically Signed by Fabienne Bruns MD on 02/01/2011 09:24:40 AM

## 2011-02-01 NOTE — Consult Note (Signed)
[name], PETERSEN               ACCOUNT NO.:  [id number]  MEDICAL RECORD NO.:  [id number]           PATIENT TYPE:  I  LOCATION:  4506                         FACILITY:  [facility]  PHYSICIAN:  Kendre Sires C. Jazlen Ogarro, MD, FACCDATE OF BIRTH:  09/18/1935  DATE OF CONSULTATION: DATE OF DISCHARGE:                                CONSULTATION   I was asked by Dr. Fabienne Bruns of the [facility] for preoperative assessment of Mr. Miro Balderson for abdominal aortic aneurysm repair.  HISTORY OF PRESENT ILLNESS:  This is a 75 year old black male who was actually admitted on May 17 with weakness and progressive shortness of breath.  He was found to have a hemoglobin of 5.4.  He has a history of chronic GI bleeding.  During this admission, he underwent consultation with Gastroenterology who performed an abdominal CT scan.  This showed an abdominal aortic aneurysm measuring 5.5 x 5.8 infra-renally and also right and left common iliac aneurysms measuring 5.3 and 3.7 cm respectively.  A lot of atherosclerotic changes were noticed in the visceral vasculature.  During his hospitalization, he was transfused up with 4 units of packed red cells.  His current hemoglobin is 9.5.  He had an upper endoscopy and showed minimal ulceration of the duodenal bulb.  He also had a capsule endoscopy, which showed a few angioectasias of the small bowel.  He was seen by Dr. Fabienne Bruns of Vascular Surgery.  Dr. Darrick Penna asked [place] to see him preoperatively for clearance for repair.  Verbal communication suggests we may try to do it percutaneously.  PAST MEDICAL HISTORY: 1. He has a history of chronic atrial fibrillation, not on Coumadin     secondary to recurrent bleeding. 2. Chronic intestinal bleeding. 3. Right lower lobe spiculated nodule with a history of heavy tobacco     use. 4. Severe COPD with a heavy tobacco history. 5. History of heavy alcohol use. 6. History of an echocardiogram in  June 2011 showing a normal ejection     fraction of 55-60%, moderate LVH, moderate aortic regurgitation,     severe mitral regurgitation, severe left atrial enlargement,     moderately enlarged right ventricle, systolic pressure increased     with a peak pulmonary pressure of 43 mm and severely dilated right     atrium as well as severe tricuspid regurgitation.  MEDICATIONS ON ADMISSION: 1. Combivent 2 puffs every 8 hours. 2. Aspirin 325 mg a day. 3. Furosemide 40 mg a day. 4. Lisinopril 40 mg a day.  He has no known drug allergies.  FAMILY HISTORY:  No history of heart disease or cancer in the family.  SOCIAL HISTORY:  He lives with a friend.  He has cut way back on his alcohol use.  He apparently no longer smokes.  He denies any drug use.  REVIEW OF SYSTEMS:  Other than HPI is negative.  PHYSICAL EXAMINATION:  GENERAL:  He is a man who looks much older than stated age. VITAL SIGNS:  His blood pressure is 140/91, his pulse is 91 and irregular, respirations 16 and unlabored, temperature is 97, O2 sats  99% on 2 liters. HEENT:  He is graying.  He is bearded.  Dentition in poor condition. Sclerae muddy.  Facial symmetry is normal. NECK:  Supple.  Carotids upstrokes were equal bilaterally without obvious bruits.  Thyroid is not enlarged.  Trachea is midline. LUNGS:  Decreased breath sounds throughout with some rhonchi on the right base. CHEST:  Barrel-shaped from COPD. HEART:  His PMI is poorly appreciated, but not displaced.  He has a soft systolic murmur 2/6 at the apex that does not radiate.  I could not appreciate a right ventricular lift.  There is no gallop.  I could not hear aortic insufficiency. ABDOMEN:  Slightly distended.  I could not palpate abdominal aortic aneurysm.  I could not hear a bruit. EXTREMITIES:  A knee and leg brace on the left leg.  He has minimal pulses in both feet.  Feet are warm, however.  He has 1+ pitting edema. SKIN:  Unremarkable. NEURO:  He is  alert and oriented.  All laboratory data, chest x-ray, CT scan, previous echo has been reviewed.  His EKG demonstrates atrial fibrillation with a well- controlled ventricular rate with no acute changes.  ASSESSMENT AND RECOMMENDATIONS:  Mr. Capell looks much older than stated age from his multiple comorbidities and hard lifestyle.  I think he will be at very high risk for any intra-abdominal or open abdominal procedure.  If Dr. Darrick Penna feels he can do this percutaneously, I would support that.  However, I would otherwise treat him medically.  I discussed this with the patient.  I would make no recommendations in the change of his medications.  I have left my numbers for Dr. Darrick Penna to call me if he feels necessary.     Sherrey North C. Daleen Squibb, MD, [facility]     TCW/MEDQ  D:  01/15/2011  T:  01/16/2011  Job:  [id number]  Electronically Signed by Valera Castle MD [facility] on 02/01/2011 01:36:40 PM

## 2011-02-01 NOTE — Consult Note (Signed)
NAMEJUDGE, DUQUE               ACCOUNT NO.:  [id number]  MEDICAL RECORD NO.:  [id number]           PATIENT TYPE:  I  LOCATION:  4506                         FACILITY:  [facility]  PHYSICIAN:  Janetta Hora. Fields, MD  DATE OF BIRTH:  29-Dec-1935  DATE OF CONSULTATION:  01/15/2011 DATE OF DISCHARGE:                                CONSULTATION   DIAGNOSES: 1. Abdominal aortic aneurysm. 2. Bilateral iliac artery aneurysms.  HISTORY OF PRESENT ILLNESS:  This is a 75 year old black male with a known history of atrial fibrillation, COPD, chronic diastolic heart failure, anemia with known history of chronic GI bleeds and has had an EGD and colonoscopy in June 2011, which revealed a duodenal ulcer and colonic diverticular with small colonic polyp.  The Pap was negative for malignancy.  He presents this admission with dyspnea on exertion.  He denies any chest pain, cough, nausea, vomiting, abdominal pain, or dysuria.  He also denies any dizziness or loss of consciousness or any focal defects.  In the ER, he had a hemoglobin of 5.4.  He is admitted to the hospital and underwent EGD and no active bleeding was found.  He then underwent a capsule endoscopy as well as colonoscopy and both were negative.  A CTA was obtained and an incidental finding of a AAA of 5.8 cm and right common iliac artery aneurysm of 5.3 cm and a left common iliac artery aneurysm of 3.7 cm were found.  Of note, it also revealed a small spiculated density in the right lower lobe of the lung.  At that time, Vascular Surgery consult was obtained.  PAST MEDICAL/PAST SURGICAL HISTORY: 1. Abdominal aortic aneurysm and bilateral iliac artery aneurysms. 2. Chronic anemia requiring transfusion. 3. History of atrial fibrillation, but not a Coumadin candidate due to     his anemia and GI bleeding. 4. Probable medial tibial plateau fracture, nondisplaced. 5. COPD. 6. Right lower lobe spiculated mass. 7. Chronic kidney disease  stage II. 8. Diastolic heart failure. 9. Oxygen dependent. 10.History of severe MR. 11.Hypertension. 12.Osteoporosis. 13.History of EtOH hepatitis.  ALLERGIES:  No known drug allergies.  CURRENT MEDICATIONS: 1. Combivent 2 puffs q.8 h p.r.n. 2. Aspirin 325 mg p.o. daily. 3. Lasix 40 mg p.o. daily. 4. Lisinopril 40 mg p.o. daily. 5. Protonix 40 mg p.o. daily. 6. Lortab 5/325 p.o. q.4 h p.r.n. pain. 7. Iron 325 mg p.o. t.i.d.  FAMILY HISTORY:  Noncontributory.  SOCIAL HISTORY:  He has remote tobacco use.  He states he quit 4 months ago and smoked 1 pack every 2-3 days.  He uses EtOH weakly as a beer. States he rarely uses liquor, but has a heavy history of EtOH abuse in the past.  REVIEW OF SYSTEMS:  GENERAL:  Negative for fever, chills or any weight changes.  HEENT:  He denies any vision changes, no sinusitis and no dentures.  NEURO:  Denies CVA or CVA symptoms.  HEART:  He denies any chest pain, history of MI, but positive for irregular heart beat. LUNGS:  Positive for shortness of breath on home O2.  He states he has had some hemoptysis twice that  was bright red and does have some phlegm. ABDOMEN:  Positive for GI bleeding.  Denies any pain.  Some constipation with this admission.  GENITOURINARY:  Denies any dysuria or hematuria. RENAL:  Does have kidney disease, but is not on dialysis.  PHYSICAL EXAMINATION:  GENERAL:  He is in no acute distress. HEENT:  He is normocephalic.  No carotid bruits are heard. NEUROLOGIC:  No focal defects. HEART:  Irregularly irregular. LUNGS:  Clear to auscultation bilaterally. ABDOMEN:  Soft, nontender, nondistended.  He has hyperactive bowel sounds.  EXTREMITIES:  There is no edema.  The left leg is in a knee brace.  He has 2+ dorsalis pedal pulses bilaterally and 2+ radial pulses bilaterally.  ASSESSMENT:  This is a 75 year old black male with multiple medical comorbidities, now with abdominal aortic aneurysm that is 5.8 cm  and bilateral common iliac artery aneurysms on the right of 5.3 cm, and on the left 3.7 cm.  PLAN:  For open repair versus and stent graft.    ______________________________ Newton Pigg, PA   ______________________________ Janetta Hora. Fields, MD    SE/MEDQ  D:  01/15/2011  T:  01/16/2011  Job:  [id number]  Electronically Signed by Newton Pigg PA on 01/18/2011 02:23:37 PM Electronically Signed by Fabienne Bruns MD on 02/01/2011 09:23:47 AM

## 2011-02-06 NOTE — Discharge Summary (Signed)
  NAMEBENSEN, CHADDERDON               ACCOUNT NO.:  [id number]  MEDICAL RECORD NO.:  [id number]           PATIENT TYPE:  I  LOCATION:  4506                         FACILITY:  [facility]  PHYSICIAN:  Ramiro Harvest, MD    DATE OF BIRTH:  [birth year]  DATE OF ADMISSION:  01/09/2011 DATE OF DISCHARGE:  01/16/2011                        DISCHARGE SUMMARY - REFERRING   ADDENDUM:  Addendum to prior discharge summary dictated per Dr. Irene Limbo of job [phone].  PRIMARY CARE PHYSICIAN:  [name], M.D.  DISCHARGE MEDICATIONS: 1. Iron sulfate 325 mg p.o. t.i.d. with meals. 2. Lortab 5/325 one tablet p.o. q.4 h. p.r.n. pain. 3. Protonix 40 mg p.o. daily. 4. MiraLax 17 grams p.o. b.i.d., hold if develops diarrhea. 5. Combivent 2 puffs every 8 hours as needed for shortness of breath. 6. Lasix 40 mg p.o. daily. 7. Lisinopril 20 mg 2 tablets p.o. daily.  DISPOSITION AND FOLLOWUP:  As per prior discharge summary, the patient will also be called by Dr. Darrick Penna' office of Vascular Surgery to schedule an appointment for aneurysmal repair next week percutaneously. For the rest of the followup, please see prior discharge summary.  For the rest of the hospitalization, please see prior discharge summary.  After care of the patient on Jan 16, 2011, the patient was seen in followup by Dr. Darrick Penna of Vascular Surgery.  He was also seen in consultation by Dr. Valera Castle of Cardiology for preop assessment, and on Jan 15, 2011, it was recommended that the patient due to his age, multiple comorbidities, and hard lifestyle, was felt that the patient will be very high risk for any intra-abdominal or open abdominal procedures.  It was felt that the patient's repair may be done percutaneously per Vascular Surgery and as such the patient will be called as an outpatient and scheduled for outpatient aneurysmal repair next week per Vascular Surgery.  The patient also did have some complaints of constipation  and such has been placed on MiraLax 17 grams twice daily.  The patient will follow up with his PCP as an outpatient in terms of this.  For the rest of the hospitalization, please see prior discharge summary dictated per Dr. Irene Limbo of job [phone].  It has been a pleasure taking care of Mr. [name].     Ramiro Harvest, MD     DT/MEDQ  D:  01/16/2011  T:  01/16/2011  Job:  [id number]  cc:   Janetta Hora. Fields, MD Dr. Dorathy Daft R. Rankins, M.D.  Electronically Signed by Ramiro Harvest MD on 02/06/2011 02:34:29 PM

## 2011-02-28 ENCOUNTER — Ambulatory Visit: Payer: Medicare Other | Admitting: Vascular Surgery

## 2011-02-28 ENCOUNTER — Ambulatory Visit
Admission: RE | Admit: 2011-02-28 | Discharge: 2011-02-28 | Disposition: A | Discharge status: Home / Self Care | Payer: Medicare Other | Source: Ambulatory Visit | Attending: Vascular Surgery | Admitting: Vascular Surgery

## 2011-02-28 DIAGNOSIS — I714 Abdominal aortic aneurysm, without rupture: Secondary | ICD-10-CM

## 2011-02-28 MED ORDER — IOHEXOL 350 MG/ML SOLN
100.0000 mL | Freq: Once | INTRAVENOUS | Status: AC | PRN
  Administered 2011-02-28: 100 mL via INTRAVENOUS

## 2011-03-11 NOTE — Discharge Summary (Signed)
[name], [name]               ACCOUNT NO.:  [id number]  MEDICAL RECORD NO.:  [id number]  LOCATION:  3310                         FACILITY:  [facility]  PHYSICIAN:  Janetta Hora. Fields, MD  DATE OF BIRTH:  12-12-1935  DATE OF ADMISSION:  01/30/2011 DATE OF DISCHARGE:  01/31/2011                              DISCHARGE SUMMARY   ADMISSION DIAGNOSIS:  Abdominal aortic aneurysm.  HISTORY OF PRESENT ILLNESS:  This is a 75 year old black male who was actually admitted on Jan 10, 2011, with weakness and progressive shortness of breath.  He was found to have a hemoglobin of 5.4.  He has a history of chronic GI bleeding.  During that admission, he underwent consultation with GI who performed an abdominal CT scan which revealed abdominal aortic aneurysm measuring 5.5 x 5.8 infrarenally and also right and left common iliac aneurysm measuring 5.3 and 3.7 cm respectively.  There was a lot of atherosclerotic change in the visceral vasculature.  During the hospitalization, he was transfused 4 units of packed red blood cells.  He had an upper endoscopy and it showed minimal ulceration of the duodenal bulb.  He also had a capsule endoscopy which showed diffuse angiectasias of the small bowel.  Dr. Darrick Penna was consulted.  HOSPITAL COURSE:  The patient was admitted to the hospital and taken to the operating room where he underwent placement of Gore Excluder aneurysm stent graft for repair of abdominal aortic and iliac aneurysm as well as coil embolization of right internal iliac artery and ProGlide percutaneous closure of the groins.  He tolerated this well and was transferred to the recovery room in satisfactory condition.  By postoperative day #1, he was doing well.  He had a positive dorsalis pedal pulse with Doppler bilaterally.  Otherwise, his hospital course included increasing ambulation as well as increasing intake of solids without difficulty.  He also has acute surgical blood loss  anemia.  DISCHARGE INSTRUCTIONS:  He is discharged home with extensive instructions on wound care and progressive ambulation.  He is instructed not to drive or perform any heavy lifting for 1 month.  DISCHARGE DIAGNOSES: 1. Abdominal aortic aneurysm.     a.     Status post endovascular AAA repair on January 29, 2006. 2. History of chronic atrial fibrillation, not on Coumadin secondary     to recurrent bleeding. 3. Chronic gastrointestinal bleeding. 4. Right lower lobe with a spiculated lung nodule with history of     heavy tobacco use. 5. Severe chronic obstructive pulmonary disease. 6. History of heavy alcohol use. 7. History of echocardiogram in June 2011 revealing normal ejection     fraction of 55-60%, moderate left ventricular hypertrophy, moderate     aortic regurgitation, severe mitral regurgitation, severe left     atrial enlargement, moderately enlarged right ventricle systolic     pressure increased with a peak pulmonary pressure of 42 mm,     severely dilated right atrium as well as severe tricuspid     regurgitation.  DISCHARGE MEDICATIONS: 1. Lortab 5/325 one tablet p.o. q.4-6 h. p.r.n. pain, #30, no refill. 2. Combivent 1-2 puffs q.8 h. P.r.n. 3. Diltiazem CD/XT 240 mg p.o. daily. 4. Lasix 40  mg p.o. daily. 5. Fosinopril 20 mg 2 tablets p.o. daily. 6. Pravastatin 40 mg p.o. daily. 7. Protonix 40 mg p.o. daily.  FOLLOWUP:  The patient is to follow up with Dr. Darrick Penna in 4 weeks.     Newton Pigg, PA   ______________________________ Janetta Hora Fields, MD    SE/MEDQ  D:  02/15/2011  T:  02/16/2011  Job:  [id number]  Electronically Signed by Newton Pigg PA on 02/18/2011 12:37:49 PM Electronically Signed by Fabienne Bruns MD on 03/11/2011 09:47:17 AM

## 2011-04-09 ENCOUNTER — Encounter: Payer: Self-pay | Admitting: Vascular Surgery

## 2011-04-24 ENCOUNTER — Encounter: Payer: Self-pay | Admitting: Vascular Surgery

## 2011-04-25 ENCOUNTER — Ambulatory Visit: Payer: Medicare Other | Admitting: Vascular Surgery

## 2011-04-30 ENCOUNTER — Emergency Department (HOSPITAL_COMMUNITY): Payer: Medicare Other

## 2011-04-30 ENCOUNTER — Inpatient Hospital Stay (HOSPITAL_COMMUNITY)
Admission: EM | Admit: 2011-04-30 | Discharge: 2011-05-24 | DRG: 190 | Disposition: A | Discharge status: Home Health | Payer: Medicare Other | Attending: Internal Medicine | Admitting: Internal Medicine

## 2011-04-30 DIAGNOSIS — K559 Vascular disorder of intestine, unspecified: Secondary | ICD-10-CM | POA: Diagnosis not present

## 2011-04-30 DIAGNOSIS — E872 Acidosis, unspecified: Secondary | ICD-10-CM | POA: Diagnosis not present

## 2011-04-30 DIAGNOSIS — I5033 Acute on chronic diastolic (congestive) heart failure: Secondary | ICD-10-CM | POA: Diagnosis present

## 2011-04-30 DIAGNOSIS — J189 Pneumonia, unspecified organism: Secondary | ICD-10-CM | POA: Diagnosis present

## 2011-04-30 DIAGNOSIS — I129 Hypertensive chronic kidney disease with stage 1 through stage 4 chronic kidney disease, or unspecified chronic kidney disease: Secondary | ICD-10-CM | POA: Diagnosis present

## 2011-04-30 DIAGNOSIS — N179 Acute kidney failure, unspecified: Secondary | ICD-10-CM | POA: Diagnosis present

## 2011-04-30 DIAGNOSIS — I4891 Unspecified atrial fibrillation: Secondary | ICD-10-CM | POA: Diagnosis present

## 2011-04-30 DIAGNOSIS — IMO0002 Reserved for concepts with insufficient information to code with codable children: Secondary | ICD-10-CM

## 2011-04-30 DIAGNOSIS — D696 Thrombocytopenia, unspecified: Secondary | ICD-10-CM | POA: Diagnosis present

## 2011-04-30 DIAGNOSIS — J441 Chronic obstructive pulmonary disease with (acute) exacerbation: Principal | ICD-10-CM | POA: Diagnosis present

## 2011-04-30 DIAGNOSIS — K5900 Constipation, unspecified: Secondary | ICD-10-CM | POA: Diagnosis not present

## 2011-04-30 DIAGNOSIS — D638 Anemia in other chronic diseases classified elsewhere: Secondary | ICD-10-CM | POA: Diagnosis present

## 2011-04-30 DIAGNOSIS — R222 Localized swelling, mass and lump, trunk: Secondary | ICD-10-CM | POA: Diagnosis present

## 2011-04-30 DIAGNOSIS — J9620 Acute and chronic respiratory failure, unspecified whether with hypoxia or hypercapnia: Secondary | ICD-10-CM | POA: Diagnosis not present

## 2011-04-30 DIAGNOSIS — D72829 Elevated white blood cell count, unspecified: Secondary | ICD-10-CM | POA: Diagnosis present

## 2011-04-30 DIAGNOSIS — I498 Other specified cardiac arrhythmias: Secondary | ICD-10-CM | POA: Diagnosis not present

## 2011-04-30 DIAGNOSIS — Z9981 Dependence on supplemental oxygen: Secondary | ICD-10-CM

## 2011-04-30 DIAGNOSIS — I509 Heart failure, unspecified: Secondary | ICD-10-CM | POA: Diagnosis present

## 2011-04-30 DIAGNOSIS — I279 Pulmonary heart disease, unspecified: Secondary | ICD-10-CM | POA: Diagnosis present

## 2011-04-30 DIAGNOSIS — N2581 Secondary hyperparathyroidism of renal origin: Secondary | ICD-10-CM | POA: Diagnosis present

## 2011-04-30 DIAGNOSIS — E875 Hyperkalemia: Secondary | ICD-10-CM | POA: Diagnosis not present

## 2011-04-30 DIAGNOSIS — I959 Hypotension, unspecified: Secondary | ICD-10-CM | POA: Diagnosis not present

## 2011-04-30 DIAGNOSIS — N183 Chronic kidney disease, stage 3 unspecified: Secondary | ICD-10-CM | POA: Diagnosis present

## 2011-04-30 DIAGNOSIS — J9819 Other pulmonary collapse: Secondary | ICD-10-CM | POA: Diagnosis present

## 2011-04-30 DIAGNOSIS — F17200 Nicotine dependence, unspecified, uncomplicated: Secondary | ICD-10-CM | POA: Diagnosis present

## 2011-04-30 LAB — BASIC METABOLIC PANEL
CO2: 27 mEq/L (ref 19–32)
Chloride: 104 mEq/L (ref 96–112)
Creatinine, Ser: 1.37 mg/dL — ABNORMAL HIGH (ref 0.50–1.35)
Sodium: 141 mEq/L (ref 135–145)

## 2011-04-30 LAB — CBC
MCV: 82 fL (ref 78.0–100.0)
Platelets: 166 10*3/uL (ref 150–400)
RBC: 3.78 MIL/uL — ABNORMAL LOW (ref 4.22–5.81)
WBC: 5.8 10*3/uL (ref 4.0–10.5)

## 2011-04-30 LAB — DIFFERENTIAL
Eosinophils Absolute: 0.1 10*3/uL (ref 0.0–0.7)
Lymphs Abs: 1 10*3/uL (ref 0.7–4.0)
Neutrophils Relative %: 72 % (ref 43–77)

## 2011-04-30 LAB — POCT I-STAT TROPONIN I: Troponin i, poc: 0.05 ng/mL (ref 0.00–0.08)

## 2011-05-01 ENCOUNTER — Inpatient Hospital Stay (HOSPITAL_COMMUNITY): Payer: Medicare Other

## 2011-05-01 ENCOUNTER — Encounter (HOSPITAL_COMMUNITY): Payer: Self-pay | Admitting: Radiology

## 2011-05-01 DIAGNOSIS — I059 Rheumatic mitral valve disease, unspecified: Secondary | ICD-10-CM

## 2011-05-01 DIAGNOSIS — M79609 Pain in unspecified limb: Secondary | ICD-10-CM

## 2011-05-01 LAB — GLUCOSE, CAPILLARY: Glucose-Capillary: 96 mg/dL (ref 70–99)

## 2011-05-01 LAB — TYPE AND SCREEN
ABO/RH(D): B POS
Antibody Screen: NEGATIVE
Unit division: 0

## 2011-05-01 LAB — DRUGS OF ABUSE SCREEN W/O ALC, ROUTINE URINE
Cocaine Metabolites: NEGATIVE
Marijuana Metabolite: NEGATIVE
Opiate Screen, Urine: NEGATIVE
Propoxyphene: NEGATIVE

## 2011-05-01 LAB — LIPID PANEL
HDL: 37 mg/dL — ABNORMAL LOW (ref >39)
Triglycerides: 48 mg/dL (ref <150)

## 2011-05-01 LAB — COMPREHENSIVE METABOLIC PANEL
ALT: 18 U/L (ref 0–53)
Calcium: 9.2 mg/dL (ref 8.4–10.5)
Creatinine, Ser: 1.42 mg/dL — ABNORMAL HIGH (ref 0.50–1.35)
GFR calc Af Amer: 59 mL/min — ABNORMAL LOW (ref >60)
Glucose, Bld: 84 mg/dL (ref 70–99)
Sodium: 142 mEq/L (ref 135–145)
Total Protein: 6.7 g/dL (ref 6.0–8.3)

## 2011-05-01 LAB — CARDIAC PANEL(CRET KIN+CKTOT+MB+TROPI)
CK, MB: 4.5 ng/mL — ABNORMAL HIGH (ref 0.3–4.0)
Relative Index: INVALID (ref 0.0–2.5)
Relative Index: INVALID (ref 0.0–2.5)
Total CK: 75 U/L (ref 7–232)
Total CK: 81 U/L (ref 7–232)

## 2011-05-01 LAB — TSH: TSH: 1.608 u[IU]/mL (ref 0.350–4.500)

## 2011-05-01 LAB — POTASSIUM: Potassium: 4.3 mEq/L (ref 3.5–5.1)

## 2011-05-01 LAB — CBC
Hemoglobin: 8.7 g/dL — ABNORMAL LOW (ref 13.0–17.0)
RBC: 3.64 MIL/uL — ABNORMAL LOW (ref 4.22–5.81)

## 2011-05-01 LAB — PRO B NATRIURETIC PEPTIDE: Pro B Natriuretic peptide (BNP): 8829 pg/mL — ABNORMAL HIGH (ref 0–125)

## 2011-05-02 DIAGNOSIS — R0902 Hypoxemia: Secondary | ICD-10-CM

## 2011-05-02 DIAGNOSIS — D381 Neoplasm of uncertain behavior of trachea, bronchus and lung: Secondary | ICD-10-CM

## 2011-05-02 DIAGNOSIS — J441 Chronic obstructive pulmonary disease with (acute) exacerbation: Secondary | ICD-10-CM

## 2011-05-02 LAB — BASIC METABOLIC PANEL
BUN: 44 mg/dL — ABNORMAL HIGH (ref 6–23)
BUN: 48 mg/dL — ABNORMAL HIGH (ref 6–23)
CO2: 23 mEq/L (ref 19–32)
CO2: 22 mEq/L (ref 19–32)
Calcium: 9.4 mg/dL (ref 8.4–10.5)
GFR calc non Af Amer: 38 mL/min — ABNORMAL LOW (ref >60)
Glucose, Bld: 132 mg/dL — ABNORMAL HIGH (ref 70–99)
Glucose, Bld: 141 mg/dL — ABNORMAL HIGH (ref 70–99)
Potassium: 5 mEq/L (ref 3.5–5.1)
Potassium: 4.9 mEq/L (ref 3.5–5.1)
Sodium: 139 mEq/L (ref 135–145)
Sodium: 138 mEq/L (ref 135–145)

## 2011-05-02 LAB — CBC
HCT: 30.5 % — ABNORMAL LOW (ref 39.0–52.0)
Hemoglobin: 9 g/dL — ABNORMAL LOW (ref 13.0–17.0)
MCH: 24.3 pg — ABNORMAL LOW (ref 26.0–34.0)
MCHC: 29.5 g/dL — ABNORMAL LOW (ref 30.0–36.0)
RBC: 3.7 MIL/uL — ABNORMAL LOW (ref 4.22–5.81)

## 2011-05-02 NOTE — H&P (Signed)
NAMEDORN, HARTSHORNE               ACCOUNT NO.:  [id number]  MEDICAL RECORD NO.:  [id number]  LOCATION:  [facility]                         FACILITY:  [facility]  PHYSICIAN:  Eduard Clos, MDDATE OF BIRTH:  1936-02-17  DATE OF ADMISSION:  04/30/2011 DATE OF DISCHARGE:                             HISTORY & PHYSICAL   PRIMARY CARE PHYSICIAN:  [name], MD, at [facility].  CHIEF COMPLAINT:  Weakness.  HISTORY OF PRESENT ILLNESS:  A 75 year old male with known history of COPD, diastolic CHF, last EF measured was in June 2011, was showing EF of 55-60%, history of abdominal attic aneurysm status post repair on January 31, 2011, history of chronic anemia with recurrent GI bleed, previous history of cigarette smoking, quit almost 3 months now, history of atrial fibrillation, not a candidate for Coumadin secondary to recurrent GI bleed presented with complaints of pain and weak over the last 3-4 days.  The patient stated he is feeling weak and he gets exhausted on walking and minimal descent.  In the ER, the patient was found to be short of breath and having bilateral expiratory wheeze.  His chest x-ray at this time is showing possibility of atelectasis or consolidation in the right middle lung.  The patient has been admitted for COPD exacerbation with possible pneumonia.  The patient denies any cough or sputum.  Denies any fever, chills. Denies any chest pain.  He denies any dizziness, loss of consciousness, or any focal deficit.  Denies any nausea, vomiting, abdominal pain, dysuria, discharge, or diarrhea.  PAST MEDICAL HISTORY: 1. History of diastolic CHF, last EF measured in June 2011, EF of 55-     60%. 2. History of hypertension. 3. History of atrial fibrillation, not a candidate for Coumadin     secondary to the recurrent GI bleed. 4. History of COPD. 5. History of abdominal aortic aneurysm status post and Coreg for Gore     Excluder aneurysm stent graft repair of  abdominal aortic aneurysm,     and iliac aneurysms and coil embolization of right internal iliac     artery. 6. History of chronic kidney disease, stage II.  MEDICATIONS PRIOR TO ADMISSION:  The patient is on: 1. Lisinopril 20 mg 2 tablets daily. 2. Diltiazem CD XT 240 mg p.o. 1 capsule daily.  ALLERGIES:  No known drug allergies.  FAMILY HISTORY:  Nothing contributory.  SOCIAL HISTORY:  The patient quit smoking 3 months ago,  he also had quit drinking same time, denies any drug abuse.  REVIEW OF SYSTEMS:  Per history of present illness, nothing else significant.  PHYSICAL EXAMINATION:  GENERAL:  The patient examined at bedside not in acute distress. VITAL SIGNS:  Blood pressure is 180/115, it is decreased to 94 after labetalol IV, pulse is around 90-100 per minute, temperature 98.1, respirations 18 per minute, O2 sat 98%. HEENT: Anicteric.  No pallor.  No discharge from ears, eyes, nose, or mouth. CHEST:  Bilateral air entry present, bilaterally expiratory wheeze heard.  No crepitation. HEART:  S1 and S2 heard. ABDOMEN:  Soft, nontender.  Bowel sounds heard. CNS:  Patient alert, awake, and oriented to time, place, and person. Moves upper and lower  extremities 5/5. EXTREMITIES:  There is mild edema on the both ankles.  No acute ischemic changes, cyanosis, or clubbing.  LABS:  EKG shows atrial fibrillation with rate around 104 beats per minute.  Patient's monitor is showing beat around 90 beats per minute after the patient got labetalol with nonspecific ST-T changes.  His EKG when compared to the old EKG in June 2012, shows the same ST-T changes. Chest x-ray shows cardiac enlargement, pulmonary arterial hypertension changes, atelectasis, or consolidation in the right middle lung.  CBC, WBC is 5.8, hemoglobin is 9.2, hematocrit is 31, platelets 166.  Basic metabolic panel, sodium 141, potassium 3.4, chloride 104, carbon dioxide 27, glucose 112, BUN 27, creatinine 1.3, calcium  9, troponin 0.05.  ASSESSMENT: 1. Chronic obstructive pulmonary disease exacerbation with possible     congestive heart failure. 2. Possible pneumonia. 3. Atrial fibrillation.  At this time, rate is moderately controlled,     not a candidate for Coumadin secondary to recurrent     gastrointestinal bleed. 4. History of recurrent gastrointestinal bleed with chronic anemia. 5. Hypertension, uncontrolled. 6. History of abdominal aortic aneurysm status post repair.  PLAN: 1. At this time, I will admit patient to telemetry. 2. For his shortness of breath, the patient has already received     nebulizers, we are going to continue with Xopenex and Atrovent.  We     will place patient on Solu-Medrol IV antibiotics for possible     pneumonia as patient has history of cigarette smoking.  I am going     to get a CT chest to rule out there is no mass.  The patient     eventually will need Lasix, at this time, his blood pressure is     slightly decreased after giving labetalol IV.  We need to monitor     his blood pressure carefully and restart his Lasix when his blood     pressure is more stable.  We will keep patient on strict intake     output daily.  We will get a 2-D echo as patient does have mild     edema in the lower extremity and get Doppler of the lower     extremity. 3. Chronic anemia with recurrent GI bleed.  I am going to __________     units and hold.  We will recheck his CBC again in a.m. and if there     is significant drop, he may have to transfuse. 4. Hypertension uncontrolled.  At this time, I have to give him one     dose of labetalol, his blood pressure decreased from 180 to almost     95 systolic.  We closely monitored his blood pressure while he was     in the     hospital. 5. Atrial fibrillation.  At this time the rate is in moderate control.     We will closely follow.  He is on Cardizem XT which we will     continue.  Further recommendation as condition  evolves.     Eduard Clos, MD     ANK/MEDQ  D:  05/01/2011  T:  05/01/2011  Job:  [id number]  cc:   Fanny Dance. Rankins, M.D.  Electronically Signed by Midge Minium MD on 05/02/2011 06:52:22 AM

## 2011-05-03 ENCOUNTER — Inpatient Hospital Stay (HOSPITAL_COMMUNITY): Payer: Medicare Other

## 2011-05-03 LAB — BASIC METABOLIC PANEL
CO2: 24 mEq/L (ref 19–32)
Chloride: 106 mEq/L (ref 96–112)
GFR calc non Af Amer: 39 mL/min — ABNORMAL LOW (ref >60)
Glucose, Bld: 127 mg/dL — ABNORMAL HIGH (ref 70–99)
Potassium: 5.2 mEq/L — ABNORMAL HIGH (ref 3.5–5.1)
Sodium: 139 mEq/L (ref 135–145)

## 2011-05-03 LAB — GLUCOSE, CAPILLARY
Glucose-Capillary: 156 mg/dL — ABNORMAL HIGH (ref 70–99)
Glucose-Capillary: 137 mg/dL — ABNORMAL HIGH (ref 70–99)

## 2011-05-03 LAB — CBC
HCT: 29.4 % — ABNORMAL LOW (ref 39.0–52.0)
Hemoglobin: 8.5 g/dL — ABNORMAL LOW (ref 13.0–17.0)
MCH: 23.8 pg — ABNORMAL LOW (ref 26.0–34.0)
MCHC: 28.9 g/dL — ABNORMAL LOW (ref 30.0–36.0)
MCV: 82.4 fL (ref 78.0–100.0)
RBC: 3.57 MIL/uL — ABNORMAL LOW (ref 4.22–5.81)

## 2011-05-03 LAB — DIFFERENTIAL
Lymphs Abs: 0.2 10*3/uL — ABNORMAL LOW (ref 0.7–4.0)
Monocytes Absolute: 0.3 10*3/uL (ref 0.1–1.0)
Monocytes Relative: 2 % — ABNORMAL LOW (ref 3–12)
Neutro Abs: 11.8 10*3/uL — ABNORMAL HIGH (ref 1.7–7.7)
Neutrophils Relative %: 96 % — ABNORMAL HIGH (ref 43–77)

## 2011-05-03 LAB — ANA: Anti Nuclear Antibody(ANA): NEGATIVE

## 2011-05-03 LAB — HIV ANTIBODY (ROUTINE TESTING W REFLEX): HIV: NONREACTIVE

## 2011-05-04 LAB — GLUCOSE, CAPILLARY: Glucose-Capillary: 134 mg/dL — ABNORMAL HIGH (ref 70–99)

## 2011-05-04 LAB — BASIC METABOLIC PANEL
Calcium: 8.9 mg/dL (ref 8.4–10.5)
GFR calc Af Amer: 46 mL/min — ABNORMAL LOW (ref >60)
GFR calc non Af Amer: 38 mL/min — ABNORMAL LOW (ref >60)
Glucose, Bld: 124 mg/dL — ABNORMAL HIGH (ref 70–99)
Sodium: 138 mEq/L (ref 135–145)

## 2011-05-05 DIAGNOSIS — R0602 Shortness of breath: Secondary | ICD-10-CM

## 2011-05-05 LAB — GLUCOSE, CAPILLARY: Glucose-Capillary: 128 mg/dL — ABNORMAL HIGH (ref 70–99)

## 2011-05-05 LAB — URINALYSIS, ROUTINE W REFLEX MICROSCOPIC
Hgb urine dipstick: NEGATIVE
Protein, ur: NEGATIVE mg/dL
Urobilinogen, UA: 0.2 mg/dL (ref 0.0–1.0)

## 2011-05-05 LAB — D-DIMER, QUANTITATIVE: D-Dimer, Quant: 2.96 ug/mL-FEU — ABNORMAL HIGH (ref 0.00–0.48)

## 2011-05-05 LAB — URINE MICROSCOPIC-ADD ON

## 2011-05-06 ENCOUNTER — Inpatient Hospital Stay (HOSPITAL_COMMUNITY): Payer: Medicare Other

## 2011-05-06 ENCOUNTER — Other Ambulatory Visit: Payer: Self-pay | Admitting: Pulmonary Disease

## 2011-05-06 DIAGNOSIS — J441 Chronic obstructive pulmonary disease with (acute) exacerbation: Secondary | ICD-10-CM

## 2011-05-06 DIAGNOSIS — D381 Neoplasm of uncertain behavior of trachea, bronchus and lung: Secondary | ICD-10-CM

## 2011-05-06 DIAGNOSIS — R0902 Hypoxemia: Secondary | ICD-10-CM

## 2011-05-06 LAB — BASIC METABOLIC PANEL
GFR calc Af Amer: 43 mL/min — ABNORMAL LOW (ref >60)
GFR calc non Af Amer: 35 mL/min — ABNORMAL LOW (ref >60)
Glucose, Bld: 115 mg/dL — ABNORMAL HIGH (ref 70–99)
Potassium: 4.5 mEq/L (ref 3.5–5.1)
Sodium: 141 mEq/L (ref 135–145)

## 2011-05-06 LAB — CBC
Hemoglobin: 8.6 g/dL — ABNORMAL LOW (ref 13.0–17.0)
MCHC: 29.9 g/dL — ABNORMAL LOW (ref 30.0–36.0)
Platelets: 200 10*3/uL (ref 150–400)
RDW: 19 % — ABNORMAL HIGH (ref 11.5–15.5)

## 2011-05-06 LAB — GLUCOSE, CAPILLARY: Glucose-Capillary: 127 mg/dL — ABNORMAL HIGH (ref 70–99)

## 2011-05-06 LAB — IRON AND TIBC
Iron: 17 ug/dL — ABNORMAL LOW (ref 42–135)
TIBC: 348 ug/dL (ref 215–435)

## 2011-05-06 MED ORDER — XENON XE 133 GAS
10.0000 | GAS_FOR_INHALATION | Freq: Once | RESPIRATORY_TRACT | Status: AC | PRN
  Administered 2011-05-06: 10 via RESPIRATORY_TRACT

## 2011-05-06 MED ORDER — TECHNETIUM TO 99M ALBUMIN AGGREGATED
6.0000 | Freq: Once | INTRAVENOUS | Status: AC | PRN
  Administered 2011-05-06: 6 via INTRAVENOUS

## 2011-05-07 DIAGNOSIS — J441 Chronic obstructive pulmonary disease with (acute) exacerbation: Secondary | ICD-10-CM

## 2011-05-07 DIAGNOSIS — J449 Chronic obstructive pulmonary disease, unspecified: Secondary | ICD-10-CM

## 2011-05-07 DIAGNOSIS — D381 Neoplasm of uncertain behavior of trachea, bronchus and lung: Secondary | ICD-10-CM

## 2011-05-07 DIAGNOSIS — R0902 Hypoxemia: Secondary | ICD-10-CM

## 2011-05-07 LAB — CBC
Hemoglobin: 9 g/dL — ABNORMAL LOW (ref 13.0–17.0)
Platelets: 219 10*3/uL (ref 150–400)
RBC: 3.78 MIL/uL — ABNORMAL LOW (ref 4.22–5.81)
WBC: 10.8 10*3/uL — ABNORMAL HIGH (ref 4.0–10.5)

## 2011-05-07 LAB — GLUCOSE, CAPILLARY
Glucose-Capillary: 123 mg/dL — ABNORMAL HIGH (ref 70–99)
Glucose-Capillary: 128 mg/dL — ABNORMAL HIGH (ref 70–99)
Glucose-Capillary: 136 mg/dL — ABNORMAL HIGH (ref 70–99)

## 2011-05-07 LAB — BASIC METABOLIC PANEL
CO2: 25 mEq/L (ref 19–32)
Glucose, Bld: 118 mg/dL — ABNORMAL HIGH (ref 70–99)
Potassium: 4.3 mEq/L (ref 3.5–5.1)
Sodium: 142 mEq/L (ref 135–145)

## 2011-05-08 ENCOUNTER — Inpatient Hospital Stay (HOSPITAL_COMMUNITY): Payer: Medicare Other

## 2011-05-08 LAB — GLUCOSE, CAPILLARY
Glucose-Capillary: 121 mg/dL — ABNORMAL HIGH (ref 70–99)
Glucose-Capillary: 142 mg/dL — ABNORMAL HIGH (ref 70–99)

## 2011-05-09 ENCOUNTER — Inpatient Hospital Stay (HOSPITAL_COMMUNITY): Payer: Medicare Other

## 2011-05-09 DIAGNOSIS — R109 Unspecified abdominal pain: Secondary | ICD-10-CM

## 2011-05-09 DIAGNOSIS — K559 Vascular disorder of intestine, unspecified: Secondary | ICD-10-CM

## 2011-05-09 LAB — BLOOD GAS, ARTERIAL
Drawn by: 24486
O2 Content: 2 L/min
O2 Saturation: 99.3 %
pO2, Arterial: 126 mmHg — ABNORMAL HIGH (ref 80.0–100.0)

## 2011-05-09 LAB — CARDIAC PANEL(CRET KIN+CKTOT+MB+TROPI)
Relative Index: INVALID (ref 0.0–2.5)
Total CK: 68 U/L (ref 7–232)

## 2011-05-09 LAB — POCT I-STAT 3, ART BLOOD GAS (G3+)
Acid-base deficit: 5 mmol/L — ABNORMAL HIGH (ref 0.0–2.0)
Bicarbonate: 19.4 mEq/L — ABNORMAL LOW (ref 20.0–24.0)
pH, Arterial: 7.361 (ref 7.350–7.450)
pO2, Arterial: 126 mmHg — ABNORMAL HIGH (ref 80.0–100.0)

## 2011-05-09 LAB — BASIC METABOLIC PANEL
CO2: 21 mEq/L (ref 19–32)
Chloride: 103 mEq/L (ref 96–112)
Chloride: 103 mEq/L (ref 96–112)
Creatinine, Ser: 2.85 mg/dL — ABNORMAL HIGH (ref 0.50–1.35)
GFR calc Af Amer: 26 mL/min — ABNORMAL LOW (ref >60)
GFR calc Af Amer: 22 mL/min — ABNORMAL LOW (ref >60)
GFR calc non Af Amer: 22 mL/min — ABNORMAL LOW (ref >60)
Potassium: 5.5 mEq/L — ABNORMAL HIGH (ref 3.5–5.1)
Potassium: 5.9 mEq/L — ABNORMAL HIGH (ref 3.5–5.1)
Sodium: 136 mEq/L (ref 135–145)

## 2011-05-09 LAB — CULTURE, RESPIRATORY W GRAM STAIN

## 2011-05-09 LAB — URINALYSIS, MICROSCOPIC ONLY
Glucose, UA: 100 mg/dL — AB
Ketones, ur: 15 mg/dL — AB
Nitrite: POSITIVE — AB
Protein, ur: >300 mg/dL — AB
pH: 5 (ref 5.0–8.0)

## 2011-05-09 LAB — CBC
Hemoglobin: 9.1 g/dL — ABNORMAL LOW (ref 13.0–17.0)
Hemoglobin: 8.8 g/dL — ABNORMAL LOW (ref 13.0–17.0)
MCHC: 30 g/dL (ref 30.0–36.0)
Platelets: 213 10*3/uL (ref 150–400)
Platelets: 209 10*3/uL (ref 150–400)
RBC: 3.83 MIL/uL — ABNORMAL LOW (ref 4.22–5.81)
RBC: 3.69 MIL/uL — ABNORMAL LOW (ref 4.22–5.81)
WBC: 12.2 10*3/uL — ABNORMAL HIGH (ref 4.0–10.5)

## 2011-05-09 LAB — GLUCOSE, CAPILLARY
Glucose-Capillary: 145 mg/dL — ABNORMAL HIGH (ref 70–99)
Glucose-Capillary: 122 mg/dL — ABNORMAL HIGH (ref 70–99)

## 2011-05-09 LAB — MRSA PCR SCREENING: MRSA by PCR: NEGATIVE

## 2011-05-09 LAB — LACTIC ACID, PLASMA: Lactic Acid, Venous: 3.4 mmol/L — ABNORMAL HIGH (ref 0.5–2.2)

## 2011-05-10 ENCOUNTER — Inpatient Hospital Stay (HOSPITAL_COMMUNITY): Payer: Medicare Other

## 2011-05-10 DIAGNOSIS — R0902 Hypoxemia: Secondary | ICD-10-CM

## 2011-05-10 LAB — RENAL FUNCTION PANEL
Albumin: 2.8 g/dL — ABNORMAL LOW (ref 3.5–5.2)
BUN: 94 mg/dL — ABNORMAL HIGH (ref 6–23)
BUN: 106 mg/dL — ABNORMAL HIGH (ref 6–23)
CO2: 23 mEq/L (ref 19–32)
Calcium: 7.6 mg/dL — ABNORMAL LOW (ref 8.4–10.5)
Chloride: 99 mEq/L (ref 96–112)
Creatinine, Ser: 3.46 mg/dL — ABNORMAL HIGH (ref 0.50–1.35)
GFR calc Af Amer: 18 mL/min — ABNORMAL LOW (ref >60)
Glucose, Bld: 350 mg/dL — ABNORMAL HIGH (ref 70–99)
Glucose, Bld: 131 mg/dL — ABNORMAL HIGH (ref 70–99)
Phosphorus: 6.3 mg/dL — ABNORMAL HIGH (ref 2.3–4.6)
Phosphorus: 6.9 mg/dL — ABNORMAL HIGH (ref 2.3–4.6)
Potassium: 5.4 mEq/L — ABNORMAL HIGH (ref 3.5–5.1)
Potassium: 5.7 mEq/L — ABNORMAL HIGH (ref 3.5–5.1)
Sodium: 137 mEq/L (ref 135–145)

## 2011-05-10 LAB — CARDIAC PANEL(CRET KIN+CKTOT+MB+TROPI)
Relative Index: INVALID (ref 0.0–2.5)
Relative Index: 6.8 — ABNORMAL HIGH (ref 0.0–2.5)
Total CK: 68 U/L (ref 7–232)
Total CK: 124 U/L (ref 7–232)
Troponin I: <0.3 ng/mL (ref <0.30)
Troponin I: <0.3 ng/mL (ref <0.30)

## 2011-05-10 LAB — CBC
HCT: 26.5 % — ABNORMAL LOW (ref 39.0–52.0)
Hemoglobin: 8.2 g/dL — ABNORMAL LOW (ref 13.0–17.0)
MCH: 24 pg — ABNORMAL LOW (ref 26.0–34.0)
MCHC: 30.9 g/dL (ref 30.0–36.0)
RDW: 19.4 % — ABNORMAL HIGH (ref 11.5–15.5)

## 2011-05-10 LAB — LACTIC ACID, PLASMA: Lactic Acid, Venous: 1.8 mmol/L (ref 0.5–2.2)

## 2011-05-10 LAB — GLUCOSE, CAPILLARY
Glucose-Capillary: 125 mg/dL — ABNORMAL HIGH (ref 70–99)
Glucose-Capillary: 113 mg/dL — ABNORMAL HIGH (ref 70–99)
Glucose-Capillary: 124 mg/dL — ABNORMAL HIGH (ref 70–99)
Glucose-Capillary: 193 mg/dL — ABNORMAL HIGH (ref 70–99)

## 2011-05-11 ENCOUNTER — Inpatient Hospital Stay (HOSPITAL_COMMUNITY): Payer: Medicare Other

## 2011-05-11 DIAGNOSIS — N170 Acute kidney failure with tubular necrosis: Secondary | ICD-10-CM

## 2011-05-11 DIAGNOSIS — I509 Heart failure, unspecified: Secondary | ICD-10-CM

## 2011-05-11 DIAGNOSIS — J449 Chronic obstructive pulmonary disease, unspecified: Secondary | ICD-10-CM

## 2011-05-11 LAB — COMPREHENSIVE METABOLIC PANEL
Albumin: 3 g/dL — ABNORMAL LOW (ref 3.5–5.2)
BUN: 113 mg/dL — ABNORMAL HIGH (ref 6–23)
Calcium: 8.3 mg/dL — ABNORMAL LOW (ref 8.4–10.5)
GFR calc Af Amer: 18 mL/min — ABNORMAL LOW (ref >60)
Glucose, Bld: 115 mg/dL — ABNORMAL HIGH (ref 70–99)
Sodium: 140 mEq/L (ref 135–145)
Total Protein: 5.6 g/dL — ABNORMAL LOW (ref 6.0–8.3)

## 2011-05-11 LAB — RENAL FUNCTION PANEL
Chloride: 99 mEq/L (ref 96–112)
GFR calc Af Amer: 17 mL/min — ABNORMAL LOW (ref >60)
Glucose, Bld: 151 mg/dL — ABNORMAL HIGH (ref 70–99)
Phosphorus: 6.7 mg/dL — ABNORMAL HIGH (ref 2.3–4.6)
Potassium: 4.3 mEq/L (ref 3.5–5.1)
Sodium: 141 mEq/L (ref 135–145)

## 2011-05-11 LAB — GLUCOSE, CAPILLARY
Glucose-Capillary: 128 mg/dL — ABNORMAL HIGH (ref 70–99)
Glucose-Capillary: 174 mg/dL — ABNORMAL HIGH (ref 70–99)
Glucose-Capillary: 300 mg/dL — ABNORMAL HIGH (ref 70–99)

## 2011-05-11 LAB — CBC
Hemoglobin: 8.2 g/dL — ABNORMAL LOW (ref 13.0–17.0)
MCH: 23.6 pg — ABNORMAL LOW (ref 26.0–34.0)
MCHC: 30.7 g/dL (ref 30.0–36.0)
RDW: 19.3 % — ABNORMAL HIGH (ref 11.5–15.5)

## 2011-05-11 LAB — MAGNESIUM: Magnesium: 2.6 mg/dL — ABNORMAL HIGH (ref 1.5–2.5)

## 2011-05-12 ENCOUNTER — Inpatient Hospital Stay (HOSPITAL_COMMUNITY): Payer: Medicare Other

## 2011-05-12 DIAGNOSIS — J449 Chronic obstructive pulmonary disease, unspecified: Secondary | ICD-10-CM

## 2011-05-12 DIAGNOSIS — I509 Heart failure, unspecified: Secondary | ICD-10-CM

## 2011-05-12 DIAGNOSIS — N170 Acute kidney failure with tubular necrosis: Secondary | ICD-10-CM

## 2011-05-12 LAB — COMPREHENSIVE METABOLIC PANEL
BUN: 112 mg/dL — ABNORMAL HIGH (ref 6–23)
Calcium: 7.6 mg/dL — ABNORMAL LOW (ref 8.4–10.5)
GFR calc Af Amer: 17 mL/min — ABNORMAL LOW (ref >60)
Glucose, Bld: 159 mg/dL — ABNORMAL HIGH (ref 70–99)
Sodium: 143 mEq/L (ref 135–145)
Total Protein: 5.3 g/dL — ABNORMAL LOW (ref 6.0–8.3)

## 2011-05-12 LAB — PHOSPHORUS: Phosphorus: 5.9 mg/dL — ABNORMAL HIGH (ref 2.3–4.6)

## 2011-05-12 LAB — GLUCOSE, CAPILLARY
Glucose-Capillary: 157 mg/dL — ABNORMAL HIGH (ref 70–99)
Glucose-Capillary: 125 mg/dL — ABNORMAL HIGH (ref 70–99)
Glucose-Capillary: 155 mg/dL — ABNORMAL HIGH (ref 70–99)

## 2011-05-12 LAB — CBC
HCT: 25.5 % — ABNORMAL LOW (ref 39.0–52.0)
Hemoglobin: 7.9 g/dL — ABNORMAL LOW (ref 13.0–17.0)
MCH: 24.1 pg — ABNORMAL LOW (ref 26.0–34.0)
MCHC: 31 g/dL (ref 30.0–36.0)

## 2011-05-12 LAB — LEGIONELLA PROFILE(CULTURE+DFA/SMEAR): Legionella Antigen (DFA): NEGATIVE

## 2011-05-13 ENCOUNTER — Inpatient Hospital Stay (HOSPITAL_COMMUNITY): Payer: Medicare Other

## 2011-05-13 DIAGNOSIS — R0902 Hypoxemia: Secondary | ICD-10-CM

## 2011-05-13 LAB — CBC
HCT: 27.5 % — ABNORMAL LOW (ref 39.0–52.0)
MCHC: 30.2 g/dL (ref 30.0–36.0)
MCV: 79 fL (ref 78.0–100.0)
RDW: 19.7 % — ABNORMAL HIGH (ref 11.5–15.5)

## 2011-05-13 LAB — GLUCOSE, CAPILLARY
Glucose-Capillary: 168 mg/dL — ABNORMAL HIGH (ref 70–99)
Glucose-Capillary: 194 mg/dL — ABNORMAL HIGH (ref 70–99)

## 2011-05-13 LAB — RENAL FUNCTION PANEL
Albumin: 3 g/dL — ABNORMAL LOW (ref 3.5–5.2)
BUN: 98 mg/dL — ABNORMAL HIGH (ref 6–23)
Chloride: 101 mEq/L (ref 96–112)
Creatinine, Ser: 3.35 mg/dL — ABNORMAL HIGH (ref 0.50–1.35)
Phosphorus: 4.4 mg/dL (ref 2.3–4.6)

## 2011-05-13 LAB — URINE CULTURE
Colony Count: >=100000
Culture  Setup Time: 201209161750

## 2011-05-14 ENCOUNTER — Inpatient Hospital Stay (HOSPITAL_COMMUNITY): Payer: Medicare Other

## 2011-05-14 DIAGNOSIS — J449 Chronic obstructive pulmonary disease, unspecified: Secondary | ICD-10-CM

## 2011-05-14 DIAGNOSIS — I509 Heart failure, unspecified: Secondary | ICD-10-CM

## 2011-05-14 DIAGNOSIS — R0902 Hypoxemia: Secondary | ICD-10-CM

## 2011-05-14 DIAGNOSIS — N170 Acute kidney failure with tubular necrosis: Secondary | ICD-10-CM

## 2011-05-14 DIAGNOSIS — J4489 Other specified chronic obstructive pulmonary disease: Secondary | ICD-10-CM

## 2011-05-14 DIAGNOSIS — I4891 Unspecified atrial fibrillation: Secondary | ICD-10-CM

## 2011-05-14 LAB — RENAL FUNCTION PANEL
Albumin: 2.9 g/dL — ABNORMAL LOW (ref 3.5–5.2)
BUN: 86 mg/dL — ABNORMAL HIGH (ref 6–23)
Calcium: 8 mg/dL — ABNORMAL LOW (ref 8.4–10.5)
Calcium: 8.2 mg/dL — ABNORMAL LOW (ref 8.4–10.5)
Creatinine, Ser: 2.89 mg/dL — ABNORMAL HIGH (ref 0.50–1.35)
GFR calc Af Amer: 26 mL/min — ABNORMAL LOW (ref >60)
GFR calc Af Amer: 30 mL/min — ABNORMAL LOW (ref >60)
GFR calc non Af Amer: 25 mL/min — ABNORMAL LOW (ref >60)
Glucose, Bld: 157 mg/dL — ABNORMAL HIGH (ref 70–99)
Phosphorus: 3.8 mg/dL (ref 2.3–4.6)
Phosphorus: 3.6 mg/dL (ref 2.3–4.6)
Potassium: 3.7 mEq/L (ref 3.5–5.1)
Sodium: 143 mEq/L (ref 135–145)
Sodium: 142 mEq/L (ref 135–145)

## 2011-05-14 LAB — GLUCOSE, CAPILLARY
Glucose-Capillary: 141 mg/dL — ABNORMAL HIGH (ref 70–99)
Glucose-Capillary: 202 mg/dL — ABNORMAL HIGH (ref 70–99)

## 2011-05-14 LAB — CBC
Hemoglobin: 8 g/dL — ABNORMAL LOW (ref 13.0–17.0)
MCH: 24.2 pg — ABNORMAL LOW (ref 26.0–34.0)
MCHC: 30.4 g/dL (ref 30.0–36.0)
MCV: 79.5 fL (ref 78.0–100.0)

## 2011-05-14 LAB — PTH, INTACT AND CALCIUM: PTH: 635.9 pg/mL — ABNORMAL HIGH (ref 14.0–72.0)

## 2011-05-15 ENCOUNTER — Inpatient Hospital Stay (HOSPITAL_COMMUNITY): Payer: Medicare Other

## 2011-05-15 DIAGNOSIS — I5033 Acute on chronic diastolic (congestive) heart failure: Secondary | ICD-10-CM

## 2011-05-15 LAB — BASIC METABOLIC PANEL
BUN: 75 mg/dL — ABNORMAL HIGH (ref 6–23)
CO2: 32 mEq/L (ref 19–32)
Chloride: 101 mEq/L (ref 96–112)
Creatinine, Ser: 2.76 mg/dL — ABNORMAL HIGH (ref 0.50–1.35)
GFR calc Af Amer: 27 mL/min — ABNORMAL LOW (ref >60)
Glucose, Bld: 145 mg/dL — ABNORMAL HIGH (ref 70–99)
Potassium: 3.4 mEq/L — ABNORMAL LOW (ref 3.5–5.1)

## 2011-05-15 LAB — GLUCOSE, CAPILLARY
Glucose-Capillary: 119 mg/dL — ABNORMAL HIGH (ref 70–99)
Glucose-Capillary: 166 mg/dL — ABNORMAL HIGH (ref 70–99)

## 2011-05-15 LAB — CBC
HCT: 30.2 % — ABNORMAL LOW (ref 39.0–52.0)
RBC: 3.73 MIL/uL — ABNORMAL LOW (ref 4.22–5.81)
RDW: 20.5 % — ABNORMAL HIGH (ref 11.5–15.5)
WBC: 19.7 10*3/uL — ABNORMAL HIGH (ref 4.0–10.5)

## 2011-05-16 ENCOUNTER — Inpatient Hospital Stay (HOSPITAL_COMMUNITY): Payer: Medicare Other

## 2011-05-16 LAB — GLUCOSE, CAPILLARY
Glucose-Capillary: 106 mg/dL — ABNORMAL HIGH (ref 70–99)
Glucose-Capillary: 158 mg/dL — ABNORMAL HIGH (ref 70–99)
Glucose-Capillary: 164 mg/dL — ABNORMAL HIGH (ref 70–99)

## 2011-05-16 LAB — RENAL FUNCTION PANEL
Albumin: 3.3 g/dL — ABNORMAL LOW (ref 3.5–5.2)
BUN: 74 mg/dL — ABNORMAL HIGH (ref 6–23)
CO2: 35 mEq/L — ABNORMAL HIGH (ref 19–32)
Chloride: 98 mEq/L (ref 96–112)
GFR calc non Af Amer: 22 mL/min — ABNORMAL LOW (ref >60)
Potassium: 4.2 mEq/L (ref 3.5–5.1)

## 2011-05-16 LAB — CBC
HCT: 33.5 % — ABNORMAL LOW (ref 39.0–52.0)
Platelets: 130 10*3/uL — ABNORMAL LOW (ref 150–400)
RBC: 4.13 MIL/uL — ABNORMAL LOW (ref 4.22–5.81)
RDW: 21.1 % — ABNORMAL HIGH (ref 11.5–15.5)
WBC: 16.9 10*3/uL — ABNORMAL HIGH (ref 4.0–10.5)

## 2011-05-16 LAB — MAGNESIUM: Magnesium: 2.2 mg/dL (ref 1.5–2.5)

## 2011-05-16 NOTE — Op Note (Signed)
  NAMEPEARL, BERLINGER               ACCOUNT NO.:  [id number]  MEDICAL RECORD NO.:  [id number]  LOCATION:  4715                         FACILITY:  [facility]  PHYSICIAN:  Felipa Evener, MD  DATE OF BIRTH:  April 14, 1936  DATE OF PROCEDURE: DATE OF DISCHARGE:                              OPERATIVE REPORT   IDENTIFICATION:  The patient is a 75 year old male.  PROCEDURE PERFORMED:  Fiberoptic bronchoscopy and bronchioalveolar lavage.  INDICATIONS FOR PROCEDURE:  Right middle lobe __________ in a patient who has a 50 pack-year history of smoking.  PROCEDURE:  After explaining the risks and benefits of procedure with the patient including pneumothorax, pneumothorax of vocal cord from drug reaction.  Patient signed informed consent.  Procedure performed in the bronchoscopy lab after giving the patient 1.5 mg of IV Versed and 100 mcg of IV fentanyl as well as topical lidocaine, bronchoscope was passed through the patient's left nostril, through the cords.  The cords appeared to be functioning within normal limits except for very thick secretion.  All secretions were suctioned.  Bronchoscope was passed through the course of carina.  A 10 mL of 1% lidocaine was instilled in the carina.  The left mainstem bronchus was entered.  Left upper lobe lingula and lower lobe had no endobronchial lesion up to the third generation bronchi.  All secretions that were suctioned, bronchoscope was withdrawn and passed to the right mainstem bronchus.  Right upper lobe was within normal limits again to the third generation bronchi. The right lower lobe was similarly within normal limits again except for very thick secretion.  The right middle lobe was patent.  It was entered again up to third generation.  Subsegmental bronchi were examined. There were no evidence for any endobronchial lesion; however, the tissue appeared to be very friable.  There were no areas to brush.  A BAL was performed at that area and  is to be sent for cytology.  All secretions were suctioned.  Bronchoscope was withdrawn through the carina.  The patient tolerated the procedure well with no complications.     Felipa Evener, MD     WJY/MEDQ  D:  05/06/2011  T:  05/06/2011  Job:  [id number]  Electronically Signed by Koren Bound MD on 05/16/2011 03:23:53 PM

## 2011-05-17 ENCOUNTER — Inpatient Hospital Stay (HOSPITAL_COMMUNITY): Payer: Medicare Other

## 2011-05-17 LAB — CBC
Platelets: 106 10*3/uL — ABNORMAL LOW (ref 150–400)
RBC: 3.8 MIL/uL — ABNORMAL LOW (ref 4.22–5.81)
WBC: 16.6 10*3/uL — ABNORMAL HIGH (ref 4.0–10.5)

## 2011-05-17 LAB — COMPREHENSIVE METABOLIC PANEL
ALT: 30 U/L (ref 0–53)
AST: 22 U/L (ref 0–37)
Alkaline Phosphatase: 60 U/L (ref 39–117)
CO2: 36 mEq/L — ABNORMAL HIGH (ref 19–32)
Chloride: 98 mEq/L (ref 96–112)
GFR calc non Af Amer: 21 mL/min — ABNORMAL LOW (ref >60)
Glucose, Bld: 90 mg/dL (ref 70–99)
Sodium: 142 mEq/L (ref 135–145)
Total Bilirubin: 0.9 mg/dL (ref 0.3–1.2)

## 2011-05-17 LAB — GLUCOSE, CAPILLARY
Glucose-Capillary: 145 mg/dL — ABNORMAL HIGH (ref 70–99)
Glucose-Capillary: 116 mg/dL — ABNORMAL HIGH (ref 70–99)

## 2011-05-17 LAB — URIC ACID: Uric Acid, Serum: 10.9 mg/dL — ABNORMAL HIGH (ref 4.0–7.8)

## 2011-05-17 NOTE — Consult Note (Signed)
[name], [name]               ACCOUNT NO.:  [id number]  MEDICAL RECORD NO.:  [id number]  LOCATION:  2927                         FACILITY:  [facility]  PHYSICIAN:  Cecille Aver, M.D.DATE OF BIRTH:  11-04-1935  DATE OF CONSULTATION: DATE OF DISCHARGE:                                CONSULTATION   REQUESTING PHYSICIAN:  Richarda Overlie, MD  REASON FOR CONSULTATION:  Acute on chronic renal failure.  HISTORY OF PRESENT ILLNESS:  [name] is a 75 year old black male with past medical history significant for COPD, diastolic heart dysfunction, AAA status post repair in June 2012, atrial fibrillation, and chronic GI bleeding.  He was admitted on May 01, 2011, for COPD exacerbations/pneumonia.  He underwent a bronchoscopy for possible lung mass on May 06, 2011, that showed no obvious lesions.  Plans were actually being made for discharge.  Then last evening, the patient developed bradycardia and hypotension, and today creatinine was noted to be up to 2.85, which was worsened from a level of 1.8 and that is the reason for consult.  Renal function prior seem to be running around 1.3 for the most of 2012.  Early on in the hospitalization, it was in the 1.6-1.8 range.  Note was made of increased postvoid residuals, but ultrasound was done on May 03, 2011, did not reveal any hydro, but did show an enlarged prostate.  The patient was previously on lisinopril, which was stopped on May 03, 2011.  He also had a urinalysis earlier on the hospitalization, which was very bland.  Today, changes in labs aside from the elevated creatinine include a worsening white blood count and the lactate up to 3.9.  The patient looks slightly uncomfortable, but is arousable and conversive.  He states that he does feel better than this morning.  PAST MEDICAL HISTORY: 1. COPD, which is severe. 2. Diastolic heart dysfunction. 3. AAA, status post repair. 4. Atrial fibrillation. 5.  Chronic GI bleeding.  CURRENT MEDICATIONS: 1. Tiazac 360 mg daily. 2. Lovenox. 3. Iron. 4. Hydralazine 25 mg t.i.d. 5. Solu-Medrol. 6. Avelox 400 mg daily. 7. Protonix 40 b.i.d.  ALLERGIES:  No known drug allergies.  SOCIAL HISTORY:  The patient was a smoker and drinker, also states that he quit 3 months ago.  FAMILY HISTORY:  Noncontributory.  REVIEW OF SYSTEMS:  Positive for legs aching, abdominal pain which comes in waves.  He also has lower extremity edema.  He denies shortness of breath, chest pain.  Has good urine output and denies hematuria or dysuria.  Remainder review of systems is negative.  PHYSICAL EXAMINATION:  VITAL SIGNS:  Afebrile.  Blood pressure 99/52, heart rate 88, respirations 16, oxygen saturation is 98% on 2 liters. Yesterday, the patient has 1250 of urine output, today none was recorded. GENERAL:  The patient is in mild distress, but arousable and conversive. HEENT:  Pupils are equal, round and reactive to light.  Extraocular motions are intact.  Mucous membranes moist. NECK:  There is no jugular venous distention. LUNGS:  Poor air movement bilaterally plus wheezes. CARDIOVASCULAR:  Regular rate and rhythm. ABDOMEN:  Soft with slightly tender in all quadrants and decreased bowel sounds. EXTREMITIES:  Reveal 1+ edema.  NEUROLOGIC:  The patient is arousable and conversive.  LABORATORY DATA:  ABG; 7.3, 732 and 126.  Potassium 5.5, bicarb 78, BUN and creatinine are 80 and 2.85 which are worse from 64 and 1.89, calcium 8.6, lactate 3.9.  Hemoglobin of 9.1, white blood count 15.  ASSESSMENT:  A 75 year old black male with chronic obstructive pulmonary disease, vasculopath, initially presented with chronic obstructive pulmonary disease/pneumonia, now status post episode of hypotension, bradycardia with now acute on chronic renal failure. 1. Acute on chronic renal failure, likely ischemic acute tubular     necrosis secondary to last night's events.  Not  sure about urine     output.  The patient says yes, but no known was recorded.  We will     place Foley catheter and follow urine output.  There was no     absolute indication for dialysis.  Hopefully with improved     hemodynamics that will improve renal perfusion and function. 2. Hyperkalemia.  We will change IV fluids to low-dose bicarb.     Recheck potassium tonight and in a.m. and use p.r.n. Kayexalate. 3. Volume.  The patient does have edema.  He is currently receiving     normal saline at 75 an hour and we will decrease IV fluids, but     continue them, so that the patient can continue to receive bicarb.  Thank you for consultation.  We will follow with you.          ______________________________ Cecille Aver, M.D.     KAG/MEDQ  D:  05/09/2011  T:  05/10/2011  Job:  [id number]  Electronically Signed by Annie Sable M.D. on 05/17/2011 05:37:36 PM

## 2011-05-18 LAB — BASIC METABOLIC PANEL
BUN: 67 mg/dL — ABNORMAL HIGH (ref 6–23)
CO2: 35 mEq/L — ABNORMAL HIGH (ref 19–32)
Chloride: 100 mEq/L (ref 96–112)
Creatinine, Ser: 2.79 mg/dL — ABNORMAL HIGH (ref 0.50–1.35)
GFR calc Af Amer: 27 mL/min — ABNORMAL LOW (ref >60)

## 2011-05-18 LAB — CBC
HCT: 29.1 % — ABNORMAL LOW (ref 39.0–52.0)
MCV: 81.7 fL (ref 78.0–100.0)
RDW: 22.2 % — ABNORMAL HIGH (ref 11.5–15.5)
WBC: 14.5 10*3/uL — ABNORMAL HIGH (ref 4.0–10.5)

## 2011-05-18 NOTE — Consult Note (Signed)
NAMEAADITYA, LETIZIA               ACCOUNT NO.:  [id number]  MEDICAL RECORD NO.:  [id number]  LOCATION:  2904                         FACILITY:  [facility]  PHYSICIAN:  Jesse Sans. Ollie Esty, MD, FACCDATE OF BIRTH:  December 08, 1935  DATE OF CONSULTATION: DATE OF DISCHARGE:                                CONSULTATION   I was asked by the Critical Care Medicine Service to consult on [name], a 75 year old black male who I have seen previously for preoperative clearance on Jan 16, 2011.  I am being asked to see him for chronic atrial fibrillation with an increased ventricular rate.  HISTORY OF PRESENT ILLNESS:  [name] is a pleasant 75 year old black male, who looks much older than stated age, who has multiple medical issues.  This includes: 1. Chronic diastolic heart failure with moderate left ventricular     hypertrophy, moderate aortic insufficiency, severe mitral     regurgitation, biatrial enlargement, moderate pulmonary     hypertension with right atrial enlargement, and severe tricuspid     regurgitation. 2. Severe peripheral vascular disease status post stent graft     placement in the distal aorta and iliac aneurysm embolization by     Dr. Darrick Penna this past summer. 3. Chronic atrial fibrillation without anticoagulation secondary to     recurrent GI bleeding most likely from an upper GI source but also     from angiectasia of small bowel. 4. History of severe chronic anemia as low as 5.4 this past summer. 5. Severe COPD. 6. History of heavy tobacco use. 7. History of heavy alcohol use.  [name] was admitted with a COPD exacerbation.  Because of volume overload, worsening renal function, [name] became more acutely short of breath.  [name] was transferred back to the intensive care unit.  His rate has been increased up to 120-130 beats per minute.  [name] is now on IV amiodarone, IV diltiazem 20 mg per hour and was started on digoxin yesterday.  His rates are currently 90-100 and [name] is  stable.  [name] was started on intravenous heparin this afternoon.  PAST MEDICAL HISTORY:  Other than the above.  MEDICINES:  Outlined in the med profile.  I have reviewed these and their extensive list.  ALLERGIES:  [name] has no known drug allergies.  FAMILY HISTORY:  No history of heart disease or cancer in his family.  SOCIAL HISTORY:  [name] lives with a friend.  [name] has cut back on alcohol. [name] no longer smokes.  [name] denies any drug use.  REVIEW OF SYSTEMS:  [name] is extremely weak.  [name] denies any orthopnea, PND, has had some edema.  PHYSICAL EXAMINATION:  GENERAL:  [name] is chronically ill male in no acute distress.  [name] is somewhat disheveled.  [name] is alert and oriented x3. VITAL SIGNS:  His blood pressure currently 133/80, his pulse is 90-100 and irregular, respirations 16 and unlabored, sats are 98% on 2 liters. HEENT:  Dentition poor condition.  [name] has a graying beard.  Sclerae are muddy.  Facial symmetry is normal. NECK:  Supple.  Carotid upstrokes are equal bilaterally with no obvious bruit.  Thyroid is not enlarged.  Trachea is midline. LUNGS:  Decreased  breath sounds in both bases with no rales. CHEST:  It barrel-shaped from his COPD. HEART:  PMI is poorly appreciated but does not appear displaced.  [name] has soft systolic murmur 2/6 at the apex that does not radiate.  I could not appreciate a right ventricular lift.  There is no gallop.  I could not hear aortic insufficiency. ABDOMEN:  Slightly distended, good bowel sounds. EXTREMITIES:  1+ edema.  Pulses are present but difficult to feel.  His feet are warm. SKIN:  Unremarkable.  All laboratory data reviewed.  ASSESSMENT AND RECOMMENDATIONS:  At this point with his chronic atrial fib, rate control is the only option.  [name] is not a Coumadin or anticoagulation candidate because of recurrent GI bleeding and severe anemia.  His current hemoglobin is 8.  His rate has been elevated secondary to volume overload with acute-on-chronic  diastolic heart failure, worsening renal insufficiency, chronic obstructive pulmonary disease exacerbation, anemia and other comorbidities.  I have made no changes in his medications today.  It will be good to increase the dose of his diltiazem and consider a beta-blocker so as to eliminate digoxin down the road.  Amiodarone will not be a good long- term alternative either.  We will follow along with you.     Franklin Cl[place]addle C. Daleen Squibb, MD, [facility]     TCW/MEDQ  D:  05/14/2011  T:  05/15/2011  Job:  [id number]  Electronically Signed by Valera Castle MD [facility] on 05/18/2011 01:45:38 PM

## 2011-05-19 LAB — BASIC METABOLIC PANEL
CO2: 33 mEq/L — ABNORMAL HIGH (ref 19–32)
Calcium: 8.6 mg/dL (ref 8.4–10.5)
Creatinine, Ser: 2.54 mg/dL — ABNORMAL HIGH (ref 0.50–1.35)
Glucose, Bld: 89 mg/dL (ref 70–99)

## 2011-05-19 LAB — DIGOXIN LEVEL: Digoxin Level: 0.8 ng/mL (ref 0.8–2.0)

## 2011-05-20 ENCOUNTER — Inpatient Hospital Stay (HOSPITAL_COMMUNITY): Payer: Medicare Other

## 2011-05-20 LAB — UIFE/LIGHT CHAINS/TP QN, 24-HR UR
Albumin, U: DETECTED
Beta, Urine: DETECTED — AB
Free Kappa Lt Chains,Ur: 3.44 mg/dL — ABNORMAL HIGH (ref 0.14–2.42)
Free Lambda Lt Chains,Ur: 0.74 mg/dL — ABNORMAL HIGH (ref 0.02–0.67)
Gamma Globulin, Urine: DETECTED — AB

## 2011-05-20 LAB — CBC
HCT: 30.6 % — ABNORMAL LOW (ref 39.0–52.0)
Hemoglobin: 9.3 g/dL — ABNORMAL LOW (ref 13.0–17.0)
MCH: 25.8 pg — ABNORMAL LOW (ref 26.0–34.0)
MCHC: 30.4 g/dL (ref 30.0–36.0)
MCV: 84.8 fL (ref 78.0–100.0)

## 2011-05-20 LAB — PROTEIN ELECTROPH W RFLX QUANT IMMUNOGLOBULINS
Alpha-1-Globulin: 5.8 % — ABNORMAL HIGH (ref 2.9–4.9)
Alpha-2-Globulin: 12.5 % — ABNORMAL HIGH (ref 7.1–11.8)
Beta 2: 6.6 % — ABNORMAL HIGH (ref 3.2–6.5)
Gamma Globulin: 10.8 % — ABNORMAL LOW (ref 11.1–18.8)

## 2011-05-20 LAB — RENAL FUNCTION PANEL
BUN: 63 mg/dL — ABNORMAL HIGH (ref 6–23)
Calcium: 8.6 mg/dL (ref 8.4–10.5)
Glucose, Bld: 108 mg/dL — ABNORMAL HIGH (ref 70–99)
Phosphorus: 2.6 mg/dL (ref 2.3–4.6)

## 2011-05-20 MED ORDER — TECHNETIUM TC 99M MERTIATIDE: 15.0000 | Freq: Once | INTRAVENOUS | Status: AC | PRN

## 2011-05-21 LAB — BASIC METABOLIC PANEL
CO2: 33 mEq/L — ABNORMAL HIGH (ref 19–32)
Calcium: 8.5 mg/dL (ref 8.4–10.5)
Glucose, Bld: 79 mg/dL (ref 70–99)
Sodium: 143 mEq/L (ref 135–145)

## 2011-05-21 LAB — CBC
HCT: 30.4 % — ABNORMAL LOW (ref 39.0–52.0)
MCH: 26 pg (ref 26.0–34.0)
MCV: 84.9 fL (ref 78.0–100.0)
Platelets: 62 10*3/uL — ABNORMAL LOW (ref 150–400)
RBC: 3.58 MIL/uL — ABNORMAL LOW (ref 4.22–5.81)

## 2011-05-22 ENCOUNTER — Encounter: Payer: Self-pay | Admitting: Vascular Surgery

## 2011-05-22 LAB — CBC
HCT: 31.6 % — ABNORMAL LOW (ref 39.0–52.0)
Hemoglobin: 9.4 g/dL — ABNORMAL LOW (ref 13.0–17.0)
MCV: 87.1 fL (ref 78.0–100.0)
RBC: 3.63 MIL/uL — ABNORMAL LOW (ref 4.22–5.81)
RDW: 26 % — ABNORMAL HIGH (ref 11.5–15.5)
WBC: 10.2 10*3/uL (ref 4.0–10.5)

## 2011-05-22 LAB — BASIC METABOLIC PANEL
BUN: 49 mg/dL — ABNORMAL HIGH (ref 6–23)
CO2: 31 mEq/L (ref 19–32)
Chloride: 100 mEq/L (ref 96–112)
Creatinine, Ser: 2.23 mg/dL — ABNORMAL HIGH (ref 0.50–1.35)
Glucose, Bld: 84 mg/dL (ref 70–99)

## 2011-05-23 ENCOUNTER — Ambulatory Visit: Payer: Medicare Other | Admitting: Vascular Surgery

## 2011-05-23 DIAGNOSIS — D696 Thrombocytopenia, unspecified: Secondary | ICD-10-CM

## 2011-05-23 DIAGNOSIS — N289 Disorder of kidney and ureter, unspecified: Secondary | ICD-10-CM

## 2011-05-23 DIAGNOSIS — I509 Heart failure, unspecified: Secondary | ICD-10-CM

## 2011-05-23 DIAGNOSIS — J4489 Other specified chronic obstructive pulmonary disease: Secondary | ICD-10-CM

## 2011-05-23 DIAGNOSIS — J449 Chronic obstructive pulmonary disease, unspecified: Secondary | ICD-10-CM

## 2011-05-23 LAB — DIFFERENTIAL
Basophils Relative: 0 % (ref 0–1)
Lymphocytes Relative: 2 % — ABNORMAL LOW (ref 12–46)
Monocytes Absolute: 0.2 10*3/uL (ref 0.1–1.0)
Monocytes Relative: 2 % — ABNORMAL LOW (ref 3–12)
Neutro Abs: 9 10*3/uL — ABNORMAL HIGH (ref 1.7–7.7)
Neutrophils Relative %: 96 % — ABNORMAL HIGH (ref 43–77)

## 2011-05-23 LAB — DIC (DISSEMINATED INTRAVASCULAR COAGULATION)PANEL
D-Dimer, Quant: 5.04 ug/mL-FEU — ABNORMAL HIGH (ref 0.00–0.48)
Fibrinogen: 311 mg/dL (ref 204–475)
Prothrombin Time: 14.3 seconds (ref 11.6–15.2)
Smear Review: NONE SEEN
aPTT: 31 seconds (ref 24–37)

## 2011-05-23 LAB — CBC
HCT: 30.3 % — ABNORMAL LOW (ref 39.0–52.0)
Hemoglobin: 9.2 g/dL — ABNORMAL LOW (ref 13.0–17.0)
Hemoglobin: 10.1 g/dL — ABNORMAL LOW (ref 13.0–17.0)
MCH: 26.4 pg (ref 26.0–34.0)
MCH: 26.4 pg (ref 26.0–34.0)
MCHC: 30.4 g/dL (ref 30.0–36.0)
MCV: 87.1 fL (ref 78.0–100.0)
MCV: 87.2 fL (ref 78.0–100.0)
Platelets: 62 10*3/uL — ABNORMAL LOW (ref 150–400)
RBC: 3.83 MIL/uL — ABNORMAL LOW (ref 4.22–5.81)
RDW: 26.9 % — ABNORMAL HIGH (ref 11.5–15.5)
WBC: 9.4 10*3/uL (ref 4.0–10.5)

## 2011-05-23 LAB — BASIC METABOLIC PANEL
BUN: 52 mg/dL — ABNORMAL HIGH (ref 6–23)
Creatinine, Ser: 2.33 mg/dL — ABNORMAL HIGH (ref 0.50–1.35)
GFR calc Af Amer: 33 mL/min — ABNORMAL LOW (ref >60)
GFR calc non Af Amer: 28 mL/min — ABNORMAL LOW (ref >60)
Glucose, Bld: 130 mg/dL — ABNORMAL HIGH (ref 70–99)

## 2011-05-23 LAB — RETICULOCYTES
RBC.: 3.72 MIL/uL — ABNORMAL LOW (ref 4.22–5.81)
Retic Count, Absolute: 122.8 10*3/uL (ref 19.0–186.0)
Retic Ct Pct: 3.3 % — ABNORMAL HIGH (ref 0.4–3.1)

## 2011-05-23 LAB — HEPARIN INDUCED THROMBOCYTOPENIA PNL
UFH Low Dose 0.1 IU/mL: 0 % Release
UFH Low Dose 0.5 IU/mL: 0 % Release

## 2011-05-24 LAB — CBC
HCT: 30.9 % — ABNORMAL LOW (ref 39.0–52.0)
HCT: 32.8 % — ABNORMAL LOW (ref 39.0–52.0)
MCH: 27 pg (ref 26.0–34.0)
MCHC: 31.1 g/dL (ref 30.0–36.0)
MCHC: 30.5 g/dL (ref 30.0–36.0)
MCV: 87 fL (ref 78.0–100.0)
Platelets: 46 10*3/uL — ABNORMAL LOW (ref 150–400)
RDW: 27.8 % — ABNORMAL HIGH (ref 11.5–15.5)
RDW: 28.1 % — ABNORMAL HIGH (ref 11.5–15.5)

## 2011-05-24 LAB — DIFFERENTIAL
Basophils Relative: 0 % (ref 0–1)
Eosinophils Absolute: 0 10*3/uL (ref 0.0–0.7)
Eosinophils Relative: 0 % (ref 0–5)
Lymphs Abs: 0.5 10*3/uL — ABNORMAL LOW (ref 0.7–4.0)
Monocytes Absolute: 0.1 10*3/uL (ref 0.1–1.0)

## 2011-05-24 LAB — BASIC METABOLIC PANEL
BUN: 59 mg/dL — ABNORMAL HIGH (ref 6–23)
Calcium: 8.4 mg/dL (ref 8.4–10.5)
Chloride: 98 mEq/L (ref 96–112)
Creatinine, Ser: 2.55 mg/dL — ABNORMAL HIGH (ref 0.50–1.35)
GFR calc Af Amer: 30 mL/min — ABNORMAL LOW (ref >60)
GFR calc non Af Amer: 25 mL/min — ABNORMAL LOW (ref >60)

## 2011-05-30 ENCOUNTER — Emergency Department (HOSPITAL_COMMUNITY): Payer: Medicare Other

## 2011-05-30 ENCOUNTER — Inpatient Hospital Stay (HOSPITAL_COMMUNITY)
Admission: EM | Admit: 2011-05-30 | Discharge: 2011-06-02 | DRG: 309 | Disposition: A | Discharge status: Home / Self Care | Payer: Medicare Other | Attending: Cardiology | Admitting: Cardiology

## 2011-05-30 DIAGNOSIS — IMO0002 Reserved for concepts with insufficient information to code with codable children: Secondary | ICD-10-CM

## 2011-05-30 DIAGNOSIS — I4891 Unspecified atrial fibrillation: Secondary | ICD-10-CM | POA: Diagnosis present

## 2011-05-30 DIAGNOSIS — Y92009 Unspecified place in unspecified non-institutional (private) residence as the place of occurrence of the external cause: Secondary | ICD-10-CM

## 2011-05-30 DIAGNOSIS — F1010 Alcohol abuse, uncomplicated: Secondary | ICD-10-CM | POA: Diagnosis present

## 2011-05-30 DIAGNOSIS — J4489 Other specified chronic obstructive pulmonary disease: Secondary | ICD-10-CM | POA: Diagnosis present

## 2011-05-30 DIAGNOSIS — J449 Chronic obstructive pulmonary disease, unspecified: Secondary | ICD-10-CM | POA: Diagnosis present

## 2011-05-30 DIAGNOSIS — I5032 Chronic diastolic (congestive) heart failure: Secondary | ICD-10-CM | POA: Diagnosis present

## 2011-05-30 DIAGNOSIS — T460X5A Adverse effect of cardiac-stimulant glycosides and drugs of similar action, initial encounter: Secondary | ICD-10-CM | POA: Diagnosis present

## 2011-05-30 DIAGNOSIS — Z87891 Personal history of nicotine dependence: Secondary | ICD-10-CM

## 2011-05-30 DIAGNOSIS — T465X5A Adverse effect of other antihypertensive drugs, initial encounter: Secondary | ICD-10-CM | POA: Diagnosis present

## 2011-05-30 DIAGNOSIS — I129 Hypertensive chronic kidney disease with stage 1 through stage 4 chronic kidney disease, or unspecified chronic kidney disease: Secondary | ICD-10-CM | POA: Diagnosis present

## 2011-05-30 DIAGNOSIS — T463X5A Adverse effect of coronary vasodilators, initial encounter: Secondary | ICD-10-CM | POA: Diagnosis present

## 2011-05-30 DIAGNOSIS — I498 Other specified cardiac arrhythmias: Secondary | ICD-10-CM

## 2011-05-30 DIAGNOSIS — D649 Anemia, unspecified: Secondary | ICD-10-CM | POA: Diagnosis present

## 2011-05-30 DIAGNOSIS — T462X5A Adverse effect of other antidysrhythmic drugs, initial encounter: Secondary | ICD-10-CM | POA: Diagnosis present

## 2011-05-30 DIAGNOSIS — N183 Chronic kidney disease, stage 3 unspecified: Secondary | ICD-10-CM | POA: Diagnosis present

## 2011-05-30 DIAGNOSIS — I059 Rheumatic mitral valve disease, unspecified: Secondary | ICD-10-CM | POA: Diagnosis present

## 2011-05-30 DIAGNOSIS — E875 Hyperkalemia: Secondary | ICD-10-CM | POA: Diagnosis present

## 2011-05-30 LAB — COMPREHENSIVE METABOLIC PANEL
BUN: 55 mg/dL — ABNORMAL HIGH (ref 6–23)
CO2: 26 mEq/L (ref 19–32)
Chloride: 102 mEq/L (ref 96–112)
Creatinine, Ser: 2.88 mg/dL — ABNORMAL HIGH (ref 0.50–1.35)
GFR calc non Af Amer: 20 mL/min — ABNORMAL LOW (ref >90)
Total Bilirubin: 0.8 mg/dL (ref 0.3–1.2)

## 2011-05-30 LAB — POCT I-STAT TROPONIN I

## 2011-05-30 LAB — POCT I-STAT, CHEM 8
Creatinine, Ser: 2.8 mg/dL — ABNORMAL HIGH (ref 0.50–1.35)
Hemoglobin: 14.3 g/dL (ref 13.0–17.0)
Sodium: 138 mEq/L (ref 135–145)
TCO2: 25 mmol/L (ref 0–100)

## 2011-05-30 LAB — PROTIME-INR
INR: 1.14 (ref 0.00–1.49)
Prothrombin Time: 14.8 seconds (ref 11.6–15.2)

## 2011-05-30 LAB — DIFFERENTIAL
Basophils Absolute: 0 10*3/uL (ref 0.0–0.1)
Eosinophils Relative: 0 % (ref 0–5)
Lymphs Abs: 0.7 10*3/uL (ref 0.7–4.0)
Monocytes Relative: 10 % (ref 3–12)
Neutro Abs: 2.4 10*3/uL (ref 1.7–7.7)

## 2011-05-30 LAB — CBC
HCT: 38.7 % — ABNORMAL LOW (ref 39.0–52.0)
Hemoglobin: 12 g/dL — ABNORMAL LOW (ref 13.0–17.0)
MCH: 28.2 pg (ref 26.0–34.0)
MCHC: 31 g/dL (ref 30.0–36.0)

## 2011-05-31 ENCOUNTER — Telehealth: Payer: Self-pay | Admitting: Family Medicine

## 2011-05-31 DIAGNOSIS — I498 Other specified cardiac arrhythmias: Secondary | ICD-10-CM

## 2011-05-31 LAB — COMPREHENSIVE METABOLIC PANEL
ALT: 41 U/L (ref 0–53)
AST: 29 U/L (ref 0–37)
Albumin: 2.9 g/dL — ABNORMAL LOW (ref 3.5–5.2)
Alkaline Phosphatase: 73 U/L (ref 39–117)
CO2: 28 mEq/L (ref 19–32)
Chloride: 105 mEq/L (ref 96–112)
Creatinine, Ser: 2.73 mg/dL — ABNORMAL HIGH (ref 0.50–1.35)
GFR calc non Af Amer: 22 mL/min — ABNORMAL LOW (ref >90)
Potassium: 4.7 mEq/L (ref 3.5–5.1)
Sodium: 142 mEq/L (ref 135–145)
Total Bilirubin: 0.9 mg/dL (ref 0.3–1.2)

## 2011-05-31 LAB — BASIC METABOLIC PANEL
BUN: 55 mg/dL — ABNORMAL HIGH (ref 6–23)
Chloride: 103 mEq/L (ref 96–112)
Creatinine, Ser: 2.8 mg/dL — ABNORMAL HIGH (ref 0.50–1.35)
GFR calc Af Amer: 24 mL/min — ABNORMAL LOW (ref >90)
Glucose, Bld: 89 mg/dL (ref 70–99)

## 2011-05-31 LAB — GLUCOSE, CAPILLARY
Glucose-Capillary: 58 mg/dL — ABNORMAL LOW (ref 70–99)
Glucose-Capillary: 85 mg/dL (ref 70–99)

## 2011-05-31 LAB — CBC
HCT: 32.1 % — ABNORMAL LOW (ref 39.0–52.0)
MCH: 27.9 pg (ref 26.0–34.0)
MCV: 90.4 fL (ref 78.0–100.0)
Platelets: 128 10*3/uL — ABNORMAL LOW (ref 150–400)
RBC: 3.55 MIL/uL — ABNORMAL LOW (ref 4.22–5.81)

## 2011-05-31 LAB — CK TOTAL AND CKMB (NOT AT ARMC): Relative Index: INVALID (ref 0.0–2.5)

## 2011-05-31 LAB — TROPONIN I
Troponin I: <0.3 ng/mL (ref <0.30)
Troponin I: <0.3 ng/mL (ref <0.30)

## 2011-05-31 LAB — TSH: TSH: 2.025 u[IU]/mL (ref 0.350–4.500)

## 2011-05-31 NOTE — Telephone Encounter (Signed)
Send to dr. Sherene Sires with note attached that was seen only in hospital not office pt. kik

## 2011-05-31 NOTE — Telephone Encounter (Signed)
Send to office of PCP

## 2011-05-31 NOTE — Telephone Encounter (Signed)
Ok will do. Thanks.

## 2011-05-31 NOTE — Telephone Encounter (Signed)
Sent the [facility] [employer] an D/C summary to [place]. They denied the budesonide inhaler. Please advise - should I send this on to Dr. Thurston Hole office in Pulmonary? This PA came through in your name, but I don't see that he's your pt.

## 2011-06-01 LAB — BASIC METABOLIC PANEL
BUN: 46 mg/dL — ABNORMAL HIGH (ref 6–23)
Creatinine, Ser: 2.3 mg/dL — ABNORMAL HIGH (ref 0.50–1.35)
GFR calc Af Amer: 31 mL/min — ABNORMAL LOW (ref >90)
GFR calc non Af Amer: 27 mL/min — ABNORMAL LOW (ref >90)
Glucose, Bld: 72 mg/dL (ref 70–99)

## 2011-06-01 LAB — CBC
HCT: 35.3 % — ABNORMAL LOW (ref 39.0–52.0)
Hemoglobin: 10.7 g/dL — ABNORMAL LOW (ref 13.0–17.0)
MCHC: 30.3 g/dL (ref 30.0–36.0)
MCV: 91.5 fL (ref 78.0–100.0)
RDW: 30 % — ABNORMAL HIGH (ref 11.5–15.5)
WBC: 2.8 10*3/uL — ABNORMAL LOW (ref 4.0–10.5)

## 2011-06-02 LAB — CBC
HCT: 32.2 % — ABNORMAL LOW (ref 39.0–52.0)
MCH: 28.3 pg (ref 26.0–34.0)
MCV: 91.2 fL (ref 78.0–100.0)
Platelets: 174 10*3/uL (ref 150–400)
RBC: 3.53 MIL/uL — ABNORMAL LOW (ref 4.22–5.81)
WBC: 3.1 10*3/uL — ABNORMAL LOW (ref 4.0–10.5)

## 2011-06-03 NOTE — Consult Note (Signed)
[name], [name]               ACCOUNT NO.:  [id number]  MEDICAL RECORD NO.:  [id number]  LOCATION:  4712                         FACILITY:  [facility]  PHYSICIAN:  Samul Dada, M.D.DATE OF BIRTH:  Nov 27, 1935  DATE OF CONSULTATION:  05/23/2011 DATE OF DISCHARGE:  05/24/2011                                CONSULTATION   REASON FOR CONSULTATION:  Thrombocytopenia.  CONSULTING PHYSICIAN:  Samul Dada, MD  REQUESTING PHYSICIAN:  [facility].  HISTORY OF PRESENT ILLNESS:  [name] is a 75 year old African American male, who recently quit alcohol intake among other significant medical problems listed below.  The patient was admitted with COPD exacerbation and acute on chronic renal insufficiency on May 01, 2011, complicated with hypotension, bradycardia requiring prolonged hospitalization.  He was noted on admission to have a platelet count of 166,000, with range between 160s and 200s until May 13, 2011. Lovenox was received on September 10 for fiberoptic bronchoscopy, (which was negative for malignancy).  The patient continued to receive prophylactically Lovenox from September 10 through May 12, 2011, then substituting the drug for IV heparin on May 13, 2011, receiving it q.8 hours through May 14, 2011, due to onset of rapid ventricular rate in the patient with a history of atrial fibrillation. Once stabilized, no further heparin was given.  His platelets during this period of time had shown a value of 149,000 on September 17, dropping to 133, and then to 130 on September 20.  On September 22, they were 97,000, dropping to 78,000 on September 24 and today, 59,000.  The patient is not bleeding at this time.  No platelet transfusion has been received by him.  His abdominal CT is negative for abnormal spleen or masses in the liver.  Only enlarged prostate has been seen.  Inspection of the peripheral smear showed variable RBCs, size,  shapes, with microcytes and elliptocytes suggesting iron deficiency.  One NRBC has been seen.  His white count is normal.  His white blood cells are normal.  The platelets showed no clumping, the size is normal, with no significant she histiocytes.  The DIC panel shows elevated D-dimer, otherwise negative for DIC.  The HIT panel is negative for HIT antibodies and serotonin release assay.  ANA and HIV are negative. Serum folate and vitamin B12 levels are normal.  Urine and serum are negative for M protein.  No clinical signs of sepsis or active bleeding to account for the low platelet count is seen.  The patient has been on Zosyn, Lasix, Protonix, and amiodarone, all of which can cause the decreasing platelets, albeit in the 1-2% incident rate.  We were asked to see the patient in consultation, to help in the recommendations regarding his low platelet counts.  PAST MEDICAL HISTORY: 1. COPD, on home oxygen. 2. Alcohol history, with known elevated PTT and PT in December 2008. 3. Tobacco history. 4. Diastolic heart failure, ejection fraction 55-60%. 5. History of abdominal aortic aneurysm, requiring repair. 6. History of iliac aneurysm. 7. ACD - GI bleed history. 8. CAF, not a Coumadin candidate. 9. History of H. pylori in May 2012, with EGD negative for malignancy. 10.History of alcoholic hepatitis. 11.Hypertension. 12.Stage III  CKD, baseline 1.3 of creatinine on April 30, 2011. 13.History of right middle lobe collapse during this admission, which     per bronchoscopy, was negative for cancer. 14.History of left Baker's cyst per Doppler on May 06, 2011. 15.History of glandular stromal hyperplasia status post TURP on     July 14, 2007.  His prostate is moderately large per CT on     May 09, 2011. 16.Diabetes mellitus per chart report on May 11, 2011. 17.History of strep viridans in sputum per culture on May 06, 2011, right lower lobe  pneumonia. 18.Severe degenerative joint disease on the L4-L5, and L5-S1. 19.Severe mitral regurgitation.  SURGERIES: 1. Status post AAA repair and graft on January 31, 2011. 2. Status post fiberoptic bronchoscopy, with bronchoalveolar lavage on     May 06, 2011. 3. Status post TURP in November 2008 with glandular stromal     hyperplasia, Dr. Wanda Plump.  ALLERGIES:  NKDA.  MEDICATIONS:  Cordarone, Dulcolax, Pulmicort, Thorazine, Aranesp, last dose on September 17, Cardizem, Lasix, Atrovent, Xopenex, mag ox, Lopressor, Nepro, Protonix, prednisone 20 mg a day, Tylenol, calcium carbonate, Chloraseptic, Delsym, Atarax, Sarna, Reglan, Zofran, sorbitol, Ambien.  REVIEW OF SYSTEMS:  Remarkable for dyspnea on exertion, occasional palpitations, abdominal pain, which is intermittent, fatigue, decreased appetite due to difficulty breathing, but is better now, according to him.  No blood in the stools or in the urine.  Denies any gum bleed or nose bleeds.  No hemoptysis.  Rest of the review of systems is negative.  FAMILY HISTORY:  Mother alive with Alzheimer's.  Father died with leukemia.  One sister and 1 brother in good health.  No sickle cell history in the family.  SOCIAL HISTORY:  The patient is single.  Retired Air cabin crew.  No children.  Lives in [place].  Full code.  He smoked up to 1 pack a day of tobacco for at least 60 years, dropping to once pack a day of tobacco, quitting 3 months ago.  He quit 3 weeks prior to admission, the use of 1 pint of vodka a week.  No recreational drugs.  Last colonoscopy on February 19, 2010, negative.  His prior colonoscopy on July 18, 2003 was positive for adenomatous polyp, negative for malignancy.  PHYSICAL EXAMINATION:  GENERAL:  This is a well-developed 75 year old African American male in no acute distress, alert and oriented x3. VITAL SIGNS: Blood pressure 128/64, pulse 65, respirations 20, temperature 98.2, O sats 100% in 2 liters,  weight 72 kg. HEENT:  Normocephalic, atraumatic.  PERRLA.  Poor dentition.  Oral cavity without thrush or lesions. NECK:  Supple.  No cervical or supraclavicular masses. LUNGS:  With a trace of rhonchi and bibasilar rales.  No axillary masses. CARDIOVASCULAR:  Irregularly irregular rate and rhythm with a 2/6 systolic murmur.  No rubs or gallops. ABDOMEN:  Soft, nontender.  Bowel sounds x4.  No hepatosplenomegaly. GU AND RECTAL:  Deferred. EXTREMITIES:  No clubbing or cyanosis.  No edema.  No inguinal masses. SKIN:  Without lesions.  Minimal right arm bruising, no petechial rash. NEURO:  Nonfocal.  LABORATORY DATA:  Hemoglobin 9.2, hematocrit 30.3, white count 8.3, platelets 59,000, MCV 87.1, iron 130, TIBC 375, percentage saturation 35, ferritin 475, PTT 33, PT 16.5, INR 1.31.  Sodium 135, potassium 3.8, BUN 52, creatinine 2.33, glucose 130, total bilirubin 0.8, alkaline phosphatase 81, AST 31, ALT 43, calcium 8.1, PSA 8.11, TSH 1.608.  SPEP shows negative M spike.  UPEP is negative for Bence  Jones protein. Folate 13.3, ferritin 40, vitamin B12 708, PSA 8.11, ANA negative, HIV negative.  Serum uric acid 10.9.  DIC showed elevated D-dimer of 5.08 with fibrinogen 311, platelets 62,000.  ASSESSMENT/PLAN:  Dr. Arline Asp has seen and evaluated the patient, and the chart has been reviewed.  This is a 75 year old [place] American male, with a prior history of alcohol, who was admitted on May 01, 2011 with exacerbation of chronic obstructive pulmonary disease, undergoing a prolonged hospitalization.  The thrombocytopenia, etiology is presently unclear, most likely immune mediated either drug related or idiopathic.  There are no specific recommendations other than to follow closely while in the hospital, and after discharge.  If the platelet count decreased further, he may have to discontinue any versus all the suspect drugs.  Could give platelet transfusions if the platelets dropped to  less than or equal to 28,000, check 1 hour post transfusion the platelet count.  Please feel free to call if you have any questions or concerns.  Of note is that the PSA is elevated, may need urology followup after discharge.  In addition, follow up with Pulmonary on the abnormal findings per chest CT.  Thank you very much for the consultation.     Marlowe Kays, PA-C   ______________________________ Samul Dada, M.D.    SW/MEDQ  D:  05/27/2011  T:  05/27/2011  Job:  [id number]  cc:   Fanny Dance. Rankins, M.D.  Electronically Signed by Marlowe Kays P.A. on 05/29/2011 01:00:07 PM Electronically Signed by Kimberlee Nearing M.D. on 06/03/2011 10:00:43 PM

## 2011-06-04 LAB — FUNGUS CULTURE W SMEAR: Fungal Smear: NONE SEEN

## 2011-06-05 LAB — BASIC METABOLIC PANEL
BUN: 14
Chloride: 108
Creatinine, Ser: 1.09
Glucose, Bld: 89
Potassium: 4.2

## 2011-06-05 LAB — PROTIME-INR
INR: 1.2
Prothrombin Time: 15.4 — ABNORMAL HIGH

## 2011-06-05 LAB — APTT: aPTT: 38 — ABNORMAL HIGH

## 2011-06-05 LAB — HEMOGLOBIN AND HEMATOCRIT, BLOOD: Hemoglobin: 11.6 — ABNORMAL LOW

## 2011-06-06 NOTE — H&P (Signed)
[name], KLIPPEL NO.:  [id number]  MEDICAL RECORD NO.:  [id number]  LOCATION:  2902                         FACILITY:  [facility]  PHYSICIAN:  Rollene Rotunda, MD, FACCDATE OF BIRTH:  1935/12/20  DATE OF ADMISSION:  05/30/2011 DATE OF DISCHARGE:                             HISTORY & PHYSICAL   PRIMARY CARE PHYSICIAN:  [name], MD  CARDIOLOGIST:  Jesse Sans. Wall, MD, FACC  REASON FOR ADMISSION:  Bradycardia.  HISTORY OF PRESENT ILLNESS:  The patient is a pleasant 75 year old African American gentleman with a very complicated recent hospitalization with sepsis and probable pneumonia.  He was just discharged on 28.  We were consulted during that time because he had atrial fibrillation with a somewhat rapid rate.  He was discharged on the meds as listed below.  However, he did not have any of these filled until Sunday.  Home health nursing has been coming up and apparently he has been taking his medications.  He had been doing well until this morning when he felt weak.  He said walking 10 feet he felt fatigued. He had some presyncope but no syncope.  He did not have any chest discomfort, neck or arm discomfort.  There was no shortness of breath, PND, or orthopnea.  He had actually been slowly recovering from his pneumonia.  He has had some knee pain and has been walking with a walker somewhat because of this.  He was sent to the emergency room by his home health nurses.  Here, he has had blood pressures fluctuating from 120-90 systolic.  His heart rate has been a junctional bradycardia at a rate of about 30.  He is mentating well and has no acute complaints.  He was given atropine at one point in time without significant bump in his heart rate.  He has been given some IV fluid.  Of note during this hospitalization, the patient did have an echocardiogram demonstrating normal left ventricular function.  He did have severe mitral regurgitation and  moderately severe tricuspid regurgitation with moderately severe pulmonary pressures.  PAST MEDICAL HISTORY:  Atrial fibrillation (not a Coumadin candidate secondary to anemia and GI bleeding), thrombocytopenia, severe mitral regurgitation, moderately severe tricuspid regurgitation (PA pressure 79), chronic renal insufficiency, diabetes mellitus (the patient actually does not report any history of this, does not take any medications for it), diastolic heart failure (EF 55%), peripheral vascular disease with stent graft to his abdominal aorta and iliac aneurysm embolization, anemia, COPD, EtOH use, previous GI bleeding, previous tobacco.  PAST SURGICAL HISTORY:  TURP, hernia repair on the right, sinus surgery, AV graft abdominal aortic aneurysm repair.  SOCIAL HISTORY:  The patient has a 60 pack-year smoking history, but quit about 5 months ago.  He lives alone.  His sister lives across from him and checks on him.  FAMILY HISTORY:  Noncontributory for early coronary artery disease.  REVIEW OF SYSTEMS:  As stated in the HPI, otherwise negative for all other systems.  PHYSICAL EXAMINATION:  GENERAL:  The patient is pleasant and in no distress. HEENT:  Eyes are unremarkable.  Pupils equal, round, and reactive to light, fundi not visualized, oral mucosa unremarkable, poor  dentition. NECK:  No jugular venous distention at 45 degrees.  Carotid upstroke brisk and symmetrical.  No bruits.  No thyromegaly. LYMPHATICS:  No cervical, axillary, or inguinal adenopathy. LUNGS:  Clear to auscultation bilaterally. BACK:  No costovertebral angle tenderness. CHEST:  Unremarkable. HEART:  PMI not displaced or sustained.  S1 and S2 within normal limits. No S3, no clicks, no rubs, 3/6 holosystolic apical murmur radiating to the axilla, no diastolic murmurs. ABDOMEN:  Flat, positive bowel sounds normal in frequency and pitch.  No bruits, no rebound, no guarding, no midline pulsatile mass,  no hepatomegaly, no splenomegaly. SKIN:  No rashes, no nodules. EXTREMITIES:  Upper pulses 2+, diminished dorsalis pedis and posterior tibialis bilaterally.  No cyanosis, no clubbing, no edema. NEURO:  Oriented to person, place, and time.  Cranial nerves II through XII grossly intact, motor grossly intact.  LABORATORY DATA:  Sodium 139, potassium 5.7, BUN 55, creatinine 2.88, platelets 127,000, WBC 3.5.  Chest x-ray, improved airspace disease.  EKG, probable underlying atrial fibrillation with junctional bradycardia, rate 30s, narrow complex, poor anterior R-wave progression, left ventricular hypertrophy, nonspecific T-wave changes.  ASSESSMENT AND PLAN: 1. Bradycardia.  The patient has bradycardia with medications as     listed.  Digoxin level is pending, although he does not have a     Digoxin toxic rhythm.  His bradycardia could be related to anyone     of his medications, but I suspect it is the combination.  At this     point, he is mentating well and has a good pulse with his     bradycardia.  His blood pressure is stable.  He has no acute     complaints.  For this reason, I will hold off on a transcutaneous     pacing though the pads are in place.  If he needs transcutaneous     pacing because of hemodynamic instability, I will probably place a     temporary transvenous pacemaker.  Since there are multiple     medications on board, it is unlikely that glucagon would help,     though I will consider this if needed.  He will be watched very     carefully in the CCU. 2. Mitral regurgitation.  The patient has severe mitral regurgitation.     He is oxygenating well.  He did get bout a liter of fluid in the     ER.  We will watch this carefully and use fluid resuscitation     judiciously. 3. Diabetes mellitus.  He is listed as having this in his hospital     notes previously, but he does not recall this and does not take any     medications.  I will cover him with a very mild  sliding scale. 4. Renal insufficiency.  His creatinine is slightly higher than his     hospital discharge.  Watch this closely. 5. Hyperkalemia.  His potassium is very mildly elevated.  Once this is     well, I am repeating this in few hours. 6. Atrial fibrillation.  The patient has been determined not to be a     Coumadin candidate because of profound anemia and GI bleeding in     the past.     Rollene Rotunda, MD, [facility]     JH/MEDQ  D:  05/30/2011  T:  05/31/2011  Job:  [id number]  cc:   Fanny Dance. Rankins, M.D.  Electronically Signed by Rollene Rotunda MD [facility] on  06/06/2011 01:40:17 PM

## 2011-06-07 ENCOUNTER — Telehealth: Payer: Self-pay | Admitting: Cardiology

## 2011-06-07 NOTE — Telephone Encounter (Signed)
Britta Mccreedy from [facility] calls today b/c pt bp was 188/80 yesterday. He is asymptomatic. She called Dr. Luciana Axe (pcp) office yesterday. Discussed importance of restricting fluids and salt as well as taking medications. Pt will follow-up with Dr. Luciana Axe Monday.  If pt becomes symptomatic will activate EMS. Mylo Red RN

## 2011-06-07 NOTE — Telephone Encounter (Signed)
Per [facility] C/O blood pressure high yesterday 180/88. Primary care MD are aware.

## 2011-06-10 ENCOUNTER — Telehealth: Payer: Self-pay | Admitting: Cardiology

## 2011-06-10 NOTE — Telephone Encounter (Signed)
Dr. Luciana Axe requests assistance in managing patient's hypertension.  He was recently admitted to hospital for bradycardia while being treated with metoprolol, diltiazem and high-dose amiodarone.  With discontinuation of these medications, heart rate has stabilized in the 60s, but systolic blood pressure today was 182.  I suggested adding amlodipine 5 mg per day and assessing the patient's degree of proteinuria.  If significant, lisinopril can be resumed with close monitoring of renal function.  If additional medication is required, addition of clonidine, Hytrin or a similar agent would be appropriate.  A return visit with Dr. Daleen Squibb was scheduled at Dr. Ephriam Knuckles request.

## 2011-06-11 ENCOUNTER — Encounter: Payer: Self-pay | Admitting: Pulmonary Disease

## 2011-06-11 ENCOUNTER — Ambulatory Visit (INDEPENDENT_AMBULATORY_CARE_PROVIDER_SITE_OTHER): Payer: Medicare Other | Admitting: Pulmonary Disease

## 2011-06-11 VITALS — BP 150/82 | HR 71 | Temp 97.8°F | Ht 68.0 in | Wt 148.8 lb

## 2011-06-11 DIAGNOSIS — R0989 Other specified symptoms and signs involving the circulatory and respiratory systems: Secondary | ICD-10-CM

## 2011-06-11 DIAGNOSIS — R0609 Other forms of dyspnea: Secondary | ICD-10-CM

## 2011-06-11 DIAGNOSIS — R0600 Dyspnea, unspecified: Secondary | ICD-10-CM | POA: Insufficient documentation

## 2011-06-11 NOTE — Patient Instructions (Signed)
Will schedule for breathing studies in about 2 weeks, and see you back same day to review.

## 2011-06-11 NOTE — Assessment & Plan Note (Signed)
The patient has chronic dyspnea on exertion and feels that he is nearly back to his usual baseline after an episode of pneumonia followed by an episode of bradycardia and congestive heart failure.  The patient's chest x-ray this month showed clearing of his prior pneumonia, but did show cardiomegaly with prominent vascularity.  He has known severe mitral regurgitation,atrial fibrillation, and diastolic dysfunction.  The patient has a history of tobacco abuse, but has not smoked in one year.  He has never had pulmonary function studies, and therefore it is unclear whether he has obstructive lung disease.  I will go ahead and schedule for full PFTs, and if he does have air flow obstruction may benefit from a bronchodilator regimen.

## 2011-06-11 NOTE — Progress Notes (Signed)
  Subjective:    Patient ID: [name], male    DOB: Jan 16, 1936, 75 y.o.   MRN: [id number]  HPI The patient is a 75 year old male who I've been asked to see for dyspnea.  He was recently in the hospital for pneumonia, complicated by acute respiratory failure.  He has had a followup chest x-ray this month which shows clearing of his pulmonary infiltrates.  However, he was recently hospitalized with bradycardia and congestive heart failure.  He has known severe mitral regurgitation with atrial fibrillation and diastolic dysfunction.  The patient has a history of smoking, but has never had pulmonary function studies.  He feels that his breathing is almost back to baseline after his 2 hospitalizations.  He describes a one block dyspnea on exertion at a moderate pace, and will also get winded going up one flight of stairs.  He denies getting short of breath bringing groceries in from the car.  He has very little cough with only white mucus production.  He has had only mild swelling of his lower extremities.   Review of Systems  Constitutional: Positive for unexpected weight change. Negative for fever.  HENT: Negative for ear pain, nosebleeds, congestion, sore throat, rhinorrhea, sneezing, trouble swallowing, dental problem, postnasal drip and sinus pressure.   Eyes: Negative for redness and itching.  Respiratory: Positive for cough and shortness of breath. Negative for chest tightness and wheezing.   Cardiovascular: Positive for palpitations. Negative for leg swelling.  Gastrointestinal: Negative for nausea and vomiting.  Genitourinary: Negative for dysuria.  Musculoskeletal: Negative for joint swelling.  Skin: Negative for rash.  Neurological: Positive for headaches.  Hematological: Does not bruise/bleed easily.  Psychiatric/Behavioral: Negative for dysphoric mood. The patient is not nervous/anxious.        Objective:   Physical Exam Constitutional:  Well developed, no acute  distress  HENT:  Nares patent without discharge  Oropharynx without exudate, palate and uvula are normal  Eyes:  Perrla, eomi, no scleral icterus  Neck:  No JVD, no TMG  Cardiovascular:  Irregular rhythm, no rubs or gallops.  2/6 sem        Intact distal pulses  Pulmonary :  Normal breath sounds, no stridor or respiratory distress   No rales, rhonchi, or wheezing  Abdominal:  Soft, nondistended, bowel sounds present.  No tenderness noted.   Musculoskeletal:  mild lower extremity edema noted.  Lymph Nodes:  No cervical lymphadenopathy noted  Skin:  No cyanosis noted  Neurologic:  Alert, appropriate, moves all 4 extremities without obvious deficit.         Assessment & Plan:

## 2011-06-12 NOTE — Discharge Summary (Signed)
[name]               ACCOUNT NO.:  [id number]  MEDICAL RECORD NO.:  [id number]  LOCATION:  4712                         FACILITY:  [facility]  PHYSICIAN:  [name] Scott, MD     DATE OF BIRTH:  02-10-1936  DATE OF ADMISSION:  04/30/2011 DATE OF DISCHARGE:                        DISCHARGE SUMMARY - REFERRING   DATE OF DISCHARGE:  To be determined.  PRIMARY CARE PHYSICIAN:  [name] R. Rankins, MD  DISCHARGE DIAGNOSES: 1. Acute exacerbation of chronic obstructive pulmonary disease. 2. Right middle lobe collapse status post bronchoscopy.  Outpatient     followup. 3. Presumed pneumonia, treated. 4. Atrial fibrillation with controlled ventricular rate.  Not an     anticoagulation candidate secondary to gastrointestinal bleed. 5. Hypertension. 6. Chronic respiratory failure secondary to chronic obstructive     pulmonary disease. 7. Abdominal aortic aneurysm status post stent graft repair. 8. Acute-on-chronic kidney disease. 9. Elevated PSA.  For outpatient evaluation and followup. 10.Shock, unclear etiology, resolved. 11.Hyperkalemia and metabolic acidosis secondary to acute renal     failure, resolved. 12.Diabetes mellitus. 13.Secondary hyperparathyroidism. 14.Acute-on-chronic diastolic heart failure.  Compensated. 15.Pulmonary hypertension. 16.Thrombocytopenia.  Will need outpatient followup.  DISCHARGE MEDICATIONS:  To be dictated by discharging MD.  PROCEDURES:  Fiberoptic bronchoscopy by Pulmonary Critical Care.  IMAGING: 1. Nuclear medicine renal scan on May 20, 2011, impression,     symmetric renal function bilaterally.  Differential renal function     is 50% bilaterally.  Symmetric renal perfusion 46% on the left and     54% on the right. 2. Chest x-ray on May 17, 2011, impression, stable exam with     unchanged bibasilar airspace opacities.  The [name] had multiple     chest x-rays during this hospital admission and that was the latest  one. 3. Abdominal x-ray on May 16, 2011, impression, mild diffuse     bowel distention, most consistent with an ileus. 4. CT of the abdomen and pelvis without contrast on May 09, 2011, impression,     a.     Infiltrated the right lung base with small right effusion.      Suspicious for right lower lobe pneumonia.     b.     Moderate cardiomegaly with coronary artery calcifications.     c.     Some ascites scattered throughout the peritoneal cavity.      There maybe periportal edema.     d.     Contracted gallbladder surrounded by ascites.     e.     No edema of large or small bowel are seen.     f.     Abdominal aortic aneurysm stent present with decrease in      size of AAA and decrease in size of bilateral common iliac artery      aneurysms. 5. Ventilation-perfusion lung scan, impression, triple mass defect in     the right lower lobe.  Low probability for pulmonary embolism. 6. Renal ultrasound on May 03, 2011, impression, no     hydronephrosis.  Bilateral renal cyst.  Some are slightly complex.     Lobulated enlargement of the prostate gland measures up to 5.7  cm     with impression upon the bladder base.  Small amount of fluid in     Morison's pouch and possibly small right-sided pleural effusion. 7. CT of the chest without contrast on May 01, 2011, impression,     a.     Complete collapse of the right middle lobe.  The right      middle lobe bronchus is occluded 3 cm beyond its origin.  The      possibility of an endobronchial mass should be considered.     b.     Emphysema with extensive peribronchial thickening at both      lung bases, right greater than the left.     c.     Marked cardiomegaly.     d.     Tiny right effusion. 8. 2-D echocardiogram on May 01, 2011, mild left ventricular     hypertrophy, left ventricular ejection fraction 55% to 60%.  Severe     mitral regurgitation.  Moderate-to-severe tricuspid regurgitation     and pulmonary  artery peak pressure was 79 mmHg. 9. Bilateral lower extremity venous Dopplers.  No evidence of deep     vein or superficial thrombosis involving the right lower extremity     and left lower extremity.  Incidental findings are consistent with     Baker cyst on the left with mixed echoes and acoustic shadowing. 10.Bilateral lower extremity Dopplers again on May 01, 2011,     impression, no evidence of deep vein or superficial thrombosis     involving the right lower extremity and left lower extremity.     Incidental findings are consistent with Baker cyst on the left     measuring 3.6 x 2.5 x 1.2 cm.  No evidence of Baker cyst on the     right. 11.May 06, 2011, bronchial washings right middle lobe, no     malignant cells identified.  LABORATORY DATA:  Heparin-induced thrombocytopenia antibody screening is negative.  CBC shows hemoglobin 10.1, hematocrit 33, white blood cell 9.4, platelets 62.  DIC panel shows elevated D-dimer of 5.04 with fibrinogen 311 and platelet count 62.  Basic metabolic panel today shows BUN 52, creatinine 2.33.  A 24-hour urine protein electrophoresis immunofixation shows no monoclonal free light chains.  There was no M- spike detected.  Last digoxin level was 0.7.  Serum uric acid was 10.9. Hepatic panel was only significant for albumin of 3.1, ferritin 475, parathormone was 636.  Urine culture shows greater than 100,000 colonies per mL of yeast.  Cardiac enzymes were cycled and showed elevated CK-MB but negative troponins.  Bronchoalveolar lavage culture grew Viridans Streptococci.  The fungal culture is negative for yeast or fungal elements preliminary report and so also negative for AFB, negative for Pneumocystis jiroveci.  Anemia panel showed serum folate 13.3, ferritin 40, and vitamin B12 of 708.  PSA elevated at 8.11.  ANA negative.  HIV antibody was nonreactive.  Drugs of few abuse was negative.  TSH was 1.608.  CONSULTATIONS: 1. Pulmonary  Critical Care team. 2. [facility]. 3. Nephrology, Dr. Marina Gravel. 4. Hematology, Dr. Kimberlee Nearing. 5. [facility], Dr. Valera Castle.  DIET:  Heart healthy diet and diabetic diet.  ACTIVITY:  Out of bed with assist and increase activity slowly.  TODAY'S COMPLAINTS:  The [name] indicates that his breathing is back to baseline and [name] denies any complaints.  PHYSICAL EXAMINATION:  GENERAL:  The [name] is in no obvious distress. VITAL SIGNS:  Temperature 98.1 degrees Fahrenheit, pulse 58 per minute, respirations 20 per minute, blood pressure 106/55 mmHg and saturating at 97% on room air. RESPIRATORY SYSTEM:  Distant breath sounds but clear and no increased work of breathing. CARDIOVASCULAR SYSTEM:  First and second heart sounds heard irregular. No JVD or murmurs. ABDOMEN:  Nondistended, soft, normal bowel sounds heard. CENTRAL NERVOUS SYSTEM:  The [name] is awake, alert, oriented x3 with no focal neurological deficits.  HOSPITAL COURSE: 1. [name] is a pleasant African American male [name] with     extensive past medical history, who was admitted on April 30, 2011, for acute exacerbation of COPD.  Initially, [name] was suspected     to have a pneumonia.  However, [name] was found to have a right middle     lobe collapsed lung.  Pulmonary physicians were consulted.  They     performed fiberoptic bronchoscopy and did not find any lesions.     The lavage was negative for malignant cells.  They do recommend     follow up CT and follow up by the Pulmonary team as an outpatient     in 4-6 weeks from hospital discharge.  [name] has completed course of     antibiotic treatment.  His respiratory status has been stable for     days.  A few days ago when the [name] had reached stability for     discharge, but [name] became hypotensive and was in shock and was     transferred to the intensive care unit.  The etiology of shock was     not clearly determined, however, this  improved.  His blood     pressures have been stable for days. 2. Acute-on-chronic kidney disease.  This is possibly hemodynamically     mediated.  The renal physicians continued to see him.  His     creatinines have stablilized and possibly achieved a new baseline.     These will have to be followed as an outpatient closely. 3. Atrial fibrillation with controlled ventricular rate.  Cardiology     saw him and [name] is deemed not to be a candidate for anticoagulation     secondary to his history of GI bleed.  [name] will continue on the     digoxin, Cardizem, metoprolol, tapering Amiodarone. 4. Thrombocytopenia.  [name] has steadily dropped his platelet count from     160 to current numbers of 50s.  The etiology was not clear.  [name] did     receive multiple antibiotics and heparin products early on the     admission.  Hematology was consulted and Dr. Arline Asp kindly saw     the [name] and [name] indicates that the etiology of the     thrombocytopenia is presenting unclear, but most likely immune     mediated either drug related or idiopathic.  There is no specific     recommendations other than to follow closely while in hospital and     after discharge.  If the platelet counts continued to decrease     further, then may have to discontinue all suspect drugs.  Could     give platelet transfusion if counts are less than or equal to     20,000 and check 1 hour posttransfusion platelet count. 5. Elevated PSA with prostatomegaly.  The [name] has been advised     that [name] would need to follow up as an outpatient with a urologist  to consider biopsy and further evaluation for prostatic malignancy     and [name] verbalizes understanding. 6. Chronic respiratory failure.  This is at baseline at this time.     The [name] will continue oxygen. 7. Right middle lobe collapsed lung.  As indicated above, this will     need to be followed up as an outpatient by the [facility] Pulmonary     team. 8. Acute-on-chronic  diastolic congestive heart failure.  This is now     compensated.  DISPOSITION:  If the [name] continues to remain stable and his platelet counts remain stable, then the [name] could potentially be discharged tomorrow, May 24, 2011, for close outpatient followup.  FOLLOWUP RECOMMENDATIONS: 1. With Dr. Benetta Spar [name] in 5-7 days from hospital discharge with     repeat CBC and basic metabolic panel. 2. With [facility] physicians in 4-6 weeks with repeat CT of     the chest.  To try and arrange this appointment prior to hospital discharge. 3. Recommend outpatient consultation with Urology for further evaluation of elevated PSA and prostatomegaly.  Time taken in coordinating this discharge is 45 minutes.     [name] Scott, MD     AH/MEDQ  D:  05/23/2011  T:  05/23/2011  Job:  [id number]  cc:   Fanny Dance. [name], M.D.  Electronically Signed by [name] Scott MD on 06/12/2011 06:45:45 PM

## 2011-06-14 ENCOUNTER — Other Ambulatory Visit: Payer: Self-pay | Admitting: Internal Medicine

## 2011-06-14 NOTE — Discharge Summary (Signed)
[name], [name]               ACCOUNT NO.:  [id number]  MEDICAL RECORD NO.:  [id number]  LOCATION:  2036                         FACILITY:  [facility]  PHYSICIAN:  Gerrit Friends. Dietrich Pates, MD, FACCDATE OF BIRTH:  [birth year]  DATE OF ADMISSION:  05/30/2011 DATE OF DISCHARGE:  06/01/2011                              DISCHARGE SUMMARY   PRIMARY CARDIOLOGIST:  Maisie Fus C. Daleen Squibb, MD, [facility]  PRIMARY CARE PROVIDER:  Fanny Dance. Rankins, M.D.  DISCHARGE DIAGNOSES: 1. Bradycardia, felt to be secondary to medication used.     a.     Metoprolol, digoxin, diltiazem, and amiodarone discontinued. 2. Paroxysmal atrial fibrillation, not felt to be a Coumadin candidate     secondary to history of gastrointestinal bleed. 3. Chronic obstructive pulmonary disease, on prednisone. 4. Chronic renal insufficiency. 5. Anemia.  SECONDARY DIAGNOSIS: 1. History of alcohol abuse. 2. History of tobacco abuse. 3. Diastolic heart failure, ejection fraction of 55-60%. 4. History of abdominal aortic aneurysm, requiring repair. 5. History of iliac aneurysm. 6. History of gastrointestinal bleed. 7. History of alcoholic hepatitis. 8. Hypertension. 9. Chronic kidney disease, stage III. 10.Severe mitral regurgitation.  PROCEDURE/DIAGNOSTICS PERFORMED DURING HOSPITALIZATION: 1. Echocardiogram.  Left ventricle with normal systolic function,     ejection fraction 55-60%.  Indeterminate diastolic function     secondary to atrial fibrillation.  Wall motion was without     abnormalities.  Mild aortic regurgitation.  Severe mitral     regurgitation, mean gradient 10 mmHg.  Left atrium severely     dilated.  Right ventricular systolic function was normal.  Right     atrium moderately dilated.  Moderate-to-severe tricuspid     regurgitation.  Pulmonary artery peak pressure was 79 mmHg. 2. Chest x-ray showing significant improvement in bibasilar opacities     since previous exam, persistent cardiomegaly.  REASON FOR  HOSPITALIZATION:  This is a 75 year old gentleman with the above-stated problem list who was recently discharged from the hospital after treatment of sepsis, probable pneumonia, and atrial fibrillation with rapid ventricular response.  He was working with his home health nurse when he felt weak, fatigue and a presyncopal episode.  He denied chest pain, shortness of breath, orthopnea or PND.  In the emergency department, he was noted be in a junctional bradycardia at a rate of about 30 beats per minute.  The patient was given IV fluids and admitted for further observation.  HOSPITAL COURSE:  The patient's bradycardia was felt to be secondary to medications as he had started taking metoprolol, digoxin, diltiazem and amiodarone at last hospitalization.  The patient remained hemodynamically stable during this time and transcutaneous pacing was not necessary.  This bradycardia did improve off the medication with heart rate staying 49-low 50's .  The patient will be discharged without medications.  The patient has had a history of anemia secondary to GI loss in the past.  It was noted that his hemoglobin and hematocrit dropped from 14/42 to 9.9/32.  Currently follow up CBC is pending as it was felt that the patient's hemoglobin of 14 and was in radius.  This is pending discharge.  If this is stable, then the patient will be discharged with outpatient follow-up.  On day of discharge, Dr. Dietrich Pates evaluated the patient and noted him stable for home.  He was ambulating in the halls on his home dose of O2, remaining above 90%.  His renal function was stable with creatinine of 2.3. Further treatment will be in outpatient setting.  DISCHARGE LABS:  CBC pending.  DISCHARGE PLANS AND INSTRUCTIONS: 1. The patient will follow up with Dr. Daleen Squibb in 10-14 days, the office     will call to schedule this appointment. 2. The patient should follow up with Dr. Barbaraann Barthel in the next 2-3     weeks.  He is to  call to schedule this appointment. 3. The patient is to increase activity as tolerated. 4. The patient is to continue low-sodium heart-healthy diet. 5. The patient is to avoid strenuous activities that cause chest pain     or shortness of breath. 6. The patient is to call the office in the interim for any problems     or concerns.  DISCHARGE MEDICATIONS: 1. Lasix 80 mg 1 tablet daily. 2. Pantoprazole 40 mg 1 tablet twice daily. 3. Prednisone 2 mg 1 tablet daily. 4. Please stop taking metoprolol tartrate, amiodarone, diltiazem, and     digoxin.  DURATION OF DISCHARGE:  Greater than 30 minutes with physician and physician extender time.     Leonette Monarch, PA-C   ______________________________ Gerrit Friends. Dietrich Pates, MD, [facility]   NB/MEDQ  D:  06/01/2011  T:  06/02/2011  Job:  [id number]  cc:   Fanny Dance. Rankins, M.D.  Electronically Signed by Alen Blew P.A. on 06/11/2011 11:53:35 AM Electronically Signed by Laramie Bing MD [facility] on 06/14/2011 03:02:02 PM

## 2011-06-17 ENCOUNTER — Encounter: Payer: Self-pay | Admitting: Physician Assistant

## 2011-06-17 ENCOUNTER — Ambulatory Visit (INDEPENDENT_AMBULATORY_CARE_PROVIDER_SITE_OTHER): Payer: Medicare Other | Admitting: Physician Assistant

## 2011-06-17 DIAGNOSIS — I4891 Unspecified atrial fibrillation: Secondary | ICD-10-CM

## 2011-06-17 DIAGNOSIS — Z8679 Personal history of other diseases of the circulatory system: Secondary | ICD-10-CM | POA: Insufficient documentation

## 2011-06-17 DIAGNOSIS — I5032 Chronic diastolic (congestive) heart failure: Secondary | ICD-10-CM

## 2011-06-17 DIAGNOSIS — I059 Rheumatic mitral valve disease, unspecified: Secondary | ICD-10-CM

## 2011-06-17 DIAGNOSIS — R0602 Shortness of breath: Secondary | ICD-10-CM

## 2011-06-17 DIAGNOSIS — J449 Chronic obstructive pulmonary disease, unspecified: Secondary | ICD-10-CM

## 2011-06-17 DIAGNOSIS — I509 Heart failure, unspecified: Secondary | ICD-10-CM

## 2011-06-17 DIAGNOSIS — N189 Chronic kidney disease, unspecified: Secondary | ICD-10-CM | POA: Insufficient documentation

## 2011-06-17 DIAGNOSIS — I10 Essential (primary) hypertension: Secondary | ICD-10-CM

## 2011-06-17 MED ORDER — AMLODIPINE BESYLATE 10 MG PO TABS: 10.0000 mg | ORAL_TABLET | Freq: Every day | ORAL | Status: DC

## 2011-06-17 MED ORDER — HYDRALAZINE HCL 25 MG PO TABS: 25.0000 mg | ORAL_TABLET | Freq: Three times a day (TID) | ORAL | Status: DC

## 2011-06-17 NOTE — Assessment & Plan Note (Signed)
Management per pulmonology.  

## 2011-06-17 NOTE — Progress Notes (Signed)
History of Present Illness: Primary Cardiologist:  Dr. Valera Castle  [name] is a 75 y.o. male who presents for post hospital follow up.  He has a history of COPD, atrial fibrillation, severe mitral regurgitationprior GI bleeding (not on Coumadin therapy), abdominal aortic aneurysm, status post repair, hypertension, chronic kidney disease, diabetes, secondary hyperparathyroidism, diastolic heart failure and pulmonary hypertension.    He was initially seen in the hospital for AFib with RVR around the time of his stent graft repair for his abdominal aortic aneurysm in June 2012.  He was then admitted 9/12 with exacerbation of his COPD and probable pneumonia.  He had a collapsed right middle lobe and underwent bronchoscopy.  Biopsy was negative for malignancy.  That admission was complicated by acute on chronic renal failure, atrial fibrillation, thrombocytopenia, acute on chronic diastolic heart failure and an elevated PSA was noted.  His rate for his atrial fibrillation was controlled with diltiazem, metoprolol, digoxin and amiodarone.  Echocardiogram 05/01/11: Mild LVH, EF 55-60%, mild AI, severe MR, severe LAE, moderate RAE, normal RVSF, moderate-severe TR, PASP 79, severe pulmonary hypertension.    Of note, V/Q scan at that time was low probability for pulmonary embolism.  He was admitted 10/4-10/6 with a chief complaint of weakness and near syncope.  He was noted to be in junctional bradycardia with heart rate in the 30s upon admission.  Diltiazem, metoprolol, digoxin and amiodarone were all discontinued.  His heart rate improved.  His PCP was contacted the on-call physician recently and amlodipine was added to his medical regimen due to uncontrolled blood pressure.  Overall, he is doing well.  He denies chest pain, syncope, palpitations.  He has chronic Dyspnea.  He describes class IIb-3 symptoms.  He sleeps on 2 pillows.  He denies PND or lower extremity edema.  He denies any other weak spells  like he had with presentation to the hospital.  Past Medical History  Diagnosis Date  . Abdominal aortic aneurysm     s/p stent graft repair in 6/12  . Iliac artery aneurysm, bilateral   . Anemia   . Atrial fibrillation     no coumadin due to h/o GI bleeding  . Tibial plateau fracture     non displaced  . COPD (chronic obstructive pulmonary disease)   . Lung mass     right lower lobe spiculated mass  . Chronic kidney disease     Stage II  . Oxygen dependent   . CHF (congestive heart failure)     Diastolic Heart failure, Hx of severe MR;  Echocardiogram 05/01/11: Mild LVH, EF 55-60%, mild AI, severe MR, severe LAE, moderate RAE, normal RVSF, moderate-severe TR, PASP 79, severe pulmonary hypertension  . Hypertension   . Osteoporosis   . Alcoholic hepatitis   . Heart murmur   . Renal insufficiency     Current Outpatient Prescriptions  Medication Sig Dispense Refill  . pantoprazole (PROTONIX) 40 MG tablet Take 40 mg by mouth 2 (two) times daily.       . predniSONE (DELTASONE) 10 MG tablet Take 10 mg by mouth daily.        Marland Kitchen DISCONTD: amLODipine (NORVASC) 2.5 MG tablet Take 2.5 mg by mouth daily.        Marland Kitchen amLODipine (NORVASC) 10 MG tablet Take 1 tablet (10 mg total) by mouth daily.  30 tablet  11  . furosemide (LASIX) 80 MG tablet Take 1 tablet (80 mg total) by mouth daily.      Marland Kitchen  hydrALAZINE (APRESOLINE) 25 MG tablet Take 1 tablet (25 mg total) by mouth 3 (three) times daily.  90 tablet  11    Allergies: No Known Allergies  Social history:  Nonsmoker.  No alcohol.  ROS:  Please see the history of present illness.  All other systems reviewed and negative.   Vital Signs: BP 156/80  Pulse 62  Ht 5\' 7"  (1.702 m)  Wt 147 lb 6.4 oz (66.86 kg)  BMI 23.09 kg/m2 Repeat blood pressure by me on the right: 170/90  PHYSICAL EXAM: Well nourished, well developed, in no acute distress HEENT: normal Neck: no JVD At 45 degrees Cardiac:  normal S1, S2; RRR; 1/6 systolic murmur Along  the lower left sternal border Lungs:  clear to auscultation bilaterally, no wheezing, rhonchi or rales Abd: soft, nontender, no hepatomegaly Ext: no edema Skin: warm and dry Neuro:  CNs 2-12 intact, no focal abnormalities noted Psych: Normal affect  EKG:  Normal sinus rhythm and, heart rate 62 and, normal axis, LVH, nonspecific ST-T wave changes  ASSESSMENT AND PLAN:

## 2011-06-17 NOTE — Assessment & Plan Note (Signed)
In the setting of severe mitral regurgitation.  He has had some symptoms that sound consistent with orthopnea.  These are chronic.  He is chronic dyspnea which is likely related to COPD.  I will check a basic metabolic panel and BNP today.  If his BNP is significantly elevated, I will adjust his Lasix.

## 2011-06-17 NOTE — Assessment & Plan Note (Signed)
Check basic metabolic panel today. 

## 2011-06-17 NOTE — Assessment & Plan Note (Addendum)
Review of the chart indicates he missed follow up with Dr. Darrick Penna in 04/2011.  AAA appeared stable at time of CT of Abd and Pelvis in 9/12.

## 2011-06-17 NOTE — Patient Instructions (Signed)
Your physician recommends that you schedule a follow-up appointment in: 07/23/11 @ 3:45 TO SEE DR. WALL PER SCOTT WEAVER, PA-C  Your physician recommends that you return for lab work in: TODAY BMET, BNP 427.31 AFIB, 428.32 HF, 786.05 SOB  Your physician has recommended you make the following change in your medication: START HYDRALAZINE 25 MG 1 TABLET 3 TIMES DAILY;

## 2011-06-17 NOTE — Assessment & Plan Note (Signed)
He is currently maintaining sinus rhythm.  Amiodarone and all of his rate controlling medications had to be discontinued secondary to significant bradycardia. He is not on aspirin or Coumadin secondary to significant history of GI bleeding.

## 2011-06-17 NOTE — Assessment & Plan Note (Signed)
As noted, metoprolol, diltiazem were discontinued recently.  He is now on amlodipine 10 mg a day.  I will add hydralazine 25 mg 3 times a day.

## 2011-06-18 LAB — BASIC METABOLIC PANEL
BUN: 30 mg/dL — ABNORMAL HIGH (ref 6–23)
GFR: 45.37 mL/min — ABNORMAL LOW (ref >60.00)
Potassium: 4.7 mEq/L (ref 3.5–5.1)

## 2011-06-18 LAB — BRAIN NATRIURETIC PEPTIDE: Pro B Natriuretic peptide (BNP): 615 pg/mL — ABNORMAL HIGH (ref 0.0–100.0)

## 2011-06-19 ENCOUNTER — Other Ambulatory Visit: Payer: Self-pay | Admitting: Internal Medicine

## 2011-06-19 LAB — AFB CULTURE WITH SMEAR (NOT AT ARMC)

## 2011-06-22 NOTE — Progress Notes (Signed)
[name], [name]               ACCOUNT NO.:  [id number]  MEDICAL RECORD NO.:  [id number]  LOCATION:  4715                         FACILITY:  [facility]  PHYSICIAN:  Richarda Overlie, MD       DATE OF BIRTH:  [birth year]                                PROGRESS NOTE   DATE OF DISCHARGE: To be determined.  CURRENT DIAGNOSES: 1. Chronic obstructive pulmonary disease exacerbation. 2. Elevated D-dimer, rule out pulmonary embolism. 3. History of hypertension. 4. Atrial fibrillation, not a candidate for anticoagulation due to     recurrent gastrointestinal bleed. 5. History of chronic obstructive pulmonary disease, home oxygen     dependent. 6. Abdominal aortic aneurysm status post stent graft repair of the     abdominal aortic aneurysm. 7. History of chronic kidney disease stage II.  DISCHARGE MEDICATIONS: To be determined by the discharge attending.  PHYSICAL EXAMINATION: VITAL SIGNS:  Blood pressure 124/85, respirations 18, pulse 87, temperature 97.1, and 100% on 2 liters. GENERAL:  Currently still quite uncomfortable with mild-to-moderate accessory muscle use. NECK:  Supple.  No JVD. LUNGS:  Bibasilar wheezing at the bases. CARDIOVASCULAR:  Irregularly irregular. ABDOMEN:  Soft, nontender, nondistended. EXTREMITIES:  Without cyanosis, clubbing, or edema. NEUROLOGIC:  Cranial nerves II-XII grossly intact.  CONSULTATIONS: Pulmonary Critical Care because of right middle lobe collapse status post bronchoscopy.  SUBJECTIVE: This is a 75 year old male with a history of COPD and several weeks history of wheezing, dyspnea, fatigue on exertion who presents with productive phlegm as well as profound hypoxia upon admission.  HOSPITAL COURSE: 1. COPD exacerbation.  The patient has been started on IV steroids as     well as empiric antibiotics.  The patient was evaluated by PCCM and     was switched from albuterol to Xopenex because of his CT scan     showing complete collapse of the  right middle lobe.  The patient     had an bronchoscopy to rule out an endobronchial mass and the     patient's bronchoscopy was negative.  The patient is still quite     symptomatic from a COPD and therefore will need to be continued on     IV Solu-Medrol at least for another couple of days.  The patient     also had an elevated D-dimer and has a V/Q scan pending. 2. Acute kidney injury.  The patient's baseline creatinine is around     1.3.  His creatinine has been elevated at around 1.75-1.8.  The     patient was started on IV hydration, which had to be discontinued     because of worsening shortness of breath.  The patient did receive     2 consecutive doses of 40 mg of IV Lasix x2 with symptomatic     relief.  His IV fluids have been since discontinued and the patient     has had good urine output.  His renal ultrasound has shown     bilateral renal cysts, lobulated enlargement of the prostate gland     up to 5.7 cm.  His PSA is elevated and the patient will need a     prostate biopsy  upon discharge.  We have also been checking for     postvoid residuals and the patient's postvoid residuals have been     around 172-200 mL.  The patient is currently not obstructed and     therefore does not have a Foley.  DISPOSITION: The patient has been quite symptomatic because of his COPD.  A PT/OT evaluation will be obtained to see if the patient needs a skilled nursing facility.  He would have minimal need home health RN for his advanced COPD.     Richarda Overlie, MD     NA/MEDQ  D:  05/06/2011  T:  05/06/2011  Job:  [id number]  Electronically Signed by Richarda Overlie MD on 06/22/2011 07:39:11 AM

## 2011-06-26 ENCOUNTER — Telehealth: Payer: Self-pay | Admitting: Pulmonary Disease

## 2011-06-26 ENCOUNTER — Encounter: Payer: Self-pay | Admitting: Vascular Surgery

## 2011-06-26 ENCOUNTER — Other Ambulatory Visit: Payer: Self-pay | Admitting: Internal Medicine

## 2011-06-26 NOTE — Telephone Encounter (Signed)
ATC Dr to find out more specific of what her concerns are prior to having KC speak with her. I held for approx 6 minutes with phone messages repeating themselves and no answer from anyone-will need to try later today.

## 2011-06-27 ENCOUNTER — Ambulatory Visit: Payer: Medicare Other | Admitting: Vascular Surgery

## 2011-06-27 ENCOUNTER — Other Ambulatory Visit: Payer: Self-pay | Admitting: *Deleted

## 2011-06-27 DIAGNOSIS — I714 Abdominal aortic aneurysm, without rupture: Secondary | ICD-10-CM

## 2011-06-27 NOTE — Telephone Encounter (Signed)
ATC Dr Rankin's office and she is out of the office until 07/01/11 and so will try to call back then.

## 2011-06-28 NOTE — Telephone Encounter (Signed)
Noted  

## 2011-07-04 ENCOUNTER — Ambulatory Visit: Payer: Medicare Other | Admitting: Vascular Surgery

## 2011-07-05 ENCOUNTER — Ambulatory Visit (INDEPENDENT_AMBULATORY_CARE_PROVIDER_SITE_OTHER): Payer: Medicare Other | Admitting: Pulmonary Disease

## 2011-07-05 ENCOUNTER — Encounter: Payer: Self-pay | Admitting: Pulmonary Disease

## 2011-07-05 VITALS — BP 132/72 | HR 73 | Temp 97.6°F | Ht 66.0 in | Wt 149.0 lb

## 2011-07-05 DIAGNOSIS — R0600 Dyspnea, unspecified: Secondary | ICD-10-CM

## 2011-07-05 DIAGNOSIS — J449 Chronic obstructive pulmonary disease, unspecified: Secondary | ICD-10-CM

## 2011-07-05 DIAGNOSIS — R0989 Other specified symptoms and signs involving the circulatory and respiratory systems: Secondary | ICD-10-CM

## 2011-07-05 DIAGNOSIS — R0609 Other forms of dyspnea: Secondary | ICD-10-CM

## 2011-07-05 LAB — PULMONARY FUNCTION TEST

## 2011-07-05 MED ORDER — BUDESONIDE-FORMOTEROL FUMARATE 160-4.5 MCG/ACT IN AERO: 2.0000 | INHALATION_SPRAY | Freq: Two times a day (BID) | RESPIRATORY_TRACT | Status: DC

## 2011-07-05 MED ORDER — ALBUTEROL SULFATE HFA 108 (90 BASE) MCG/ACT IN AERS: 2.0000 | INHALATION_SPRAY | Freq: Four times a day (QID) | RESPIRATORY_TRACT | Status: DC | PRN

## 2011-07-05 MED ORDER — TIOTROPIUM BROMIDE MONOHYDRATE 18 MCG IN CAPS: 18.0000 ug | ORAL_CAPSULE | Freq: Every day | RESPIRATORY_TRACT | Status: DC

## 2011-07-05 MED ORDER — ALBUTEROL SULFATE 2 MG PO TABS: 2.0000 mg | ORAL_TABLET | Freq: Three times a day (TID) | ORAL | Status: DC

## 2011-07-05 NOTE — Progress Notes (Signed)
PFT done today. 

## 2011-07-05 NOTE — Assessment & Plan Note (Signed)
The patient has severe airflow obstruction on his PFTs today that is most likely due to emphysema.  I would like to start him on an aggressive bronchodilator regimen, and see how he does.  He is to continue his oxygen nocturnally.  I think he would also benefit from some type of conditioning program, but perhaps cardiac rehabilitation may be the best location given his significant heart disease.

## 2011-07-05 NOTE — Patient Instructions (Signed)
Start spiriva one inhalation each am Start symbicort 160/4.5  2 inhalations am and pm everyday.  Rinse mouth well Use albuterol 2 inhalations up to every 6 hrs if needed for emergencies.  Stay as active as possible. followup with me in 2mos.

## 2011-07-05 NOTE — Assessment & Plan Note (Signed)
The patient has multifactorial dyspnea on exertion, related to his underlying cardiac disease, anorexia and weakness, as well as his new diagnosis of severe COPD.

## 2011-07-05 NOTE — Progress Notes (Signed)
  Subjective:    Patient ID: [name], male    DOB: [birth year], 75 y.o.   MRN: [id number]  HPI The patient comes in today for followup after his recent PFTs.  He was found to have severe airflow obstruction, and this is most likely due to emphysema.  I have reviewed the study with him in detail, and answered all of his questions.   Review of Systems  Constitutional: Negative for fever and unexpected weight change.  HENT: Negative for ear pain, nosebleeds, congestion, sore throat, rhinorrhea, sneezing, trouble swallowing, dental problem, postnasal drip and sinus pressure.   Eyes: Negative for redness and itching.  Respiratory: Negative for cough, chest tightness, shortness of breath and wheezing.   Cardiovascular: Negative for palpitations and leg swelling.  Gastrointestinal: Negative for nausea and vomiting.  Genitourinary: Negative for dysuria.  Musculoskeletal: Negative for joint swelling.  Skin: Negative for rash.  Neurological: Negative for headaches.  Hematological: Does not bruise/bleed easily.  Psychiatric/Behavioral: Negative for dysphoric mood. The patient is not nervous/anxious.        Objective:   Physical Exam Thin male in no acute distress Nose without purulence or discharge noted Chest with decreased breath sounds throughout Cardiac exam with a controlled ventricular response Lower extremities without significant edema       Assessment & Plan:

## 2011-07-20 ENCOUNTER — Other Ambulatory Visit: Payer: Self-pay | Admitting: Internal Medicine

## 2011-07-22 NOTE — Telephone Encounter (Signed)
Not a pt of dr. Amador Cunas - never seen in office

## 2011-07-23 ENCOUNTER — Ambulatory Visit: Payer: Medicare Other | Admitting: Cardiology

## 2011-07-24 LAB — PSA: PSA: 13.05

## 2011-07-31 ENCOUNTER — Ambulatory Visit
Admission: RE | Admit: 2011-07-31 | Discharge: 2011-07-31 | Disposition: A | Discharge status: Home / Self Care | Payer: Medicare Other | Source: Ambulatory Visit | Attending: Vascular Surgery | Admitting: Vascular Surgery

## 2011-07-31 ENCOUNTER — Encounter: Payer: Self-pay | Admitting: Vascular Surgery

## 2011-07-31 DIAGNOSIS — I714 Abdominal aortic aneurysm, without rupture: Secondary | ICD-10-CM

## 2011-07-31 MED ORDER — IOHEXOL 350 MG/ML SOLN
60.0000 mL | Freq: Once | INTRAVENOUS | Status: AC | PRN
  Administered 2011-07-31: 60 mL via INTRAVENOUS

## 2011-08-01 ENCOUNTER — Ambulatory Visit (INDEPENDENT_AMBULATORY_CARE_PROVIDER_SITE_OTHER): Payer: Medicare Other | Admitting: Vascular Surgery

## 2011-08-01 ENCOUNTER — Encounter: Payer: Self-pay | Admitting: Vascular Surgery

## 2011-08-01 ENCOUNTER — Inpatient Hospital Stay
Admission: RE | Admit: 2011-08-01 | Discharge: 2011-08-01 | Payer: Medicare Other | Source: Ambulatory Visit | Attending: Vascular Surgery | Admitting: Vascular Surgery

## 2011-08-01 VITALS — BP 173/85 | HR 76 | Resp 16 | Ht 69.0 in | Wt 155.0 lb

## 2011-08-01 DIAGNOSIS — I714 Abdominal aortic aneurysm, without rupture: Secondary | ICD-10-CM

## 2011-08-01 NOTE — Progress Notes (Addendum)
[facility] HISTORY AND PHYSICAL    History of Present Illness:  Patient is a 75 y.o.75 y.o. year old male who presents for follow-up evaluation of AAA. He underwent Gore Excluder aneurysm stent graft repair in 01/30/11. The patient denies new abdominal or back pain.  The patient's atherosclerotic risk factors remain a fib, chf, hypertension.  These are all currently stable and followed by his primary care physician. He denies any hip or buttock claudication. He does have some occasional left knee pain from arthritis. His stent graft repair require coil embolization of the right internal iliac artery. He had subtotal occlusion of the left internal iliac artery. The stent graft was extended into the external iliac artery bilaterally the treat common iliac aneurysms as well.  Past Medical History  Diagnosis Date  . Abdominal aortic aneurysm     s/p stent graft repair in 6/12  . Iliac artery aneurysm, bilateral   . Anemia   . Atrial fibrillation     no coumadin due to h/o GI bleeding  . Tibial plateau fracture     non displaced  . COPD (chronic obstructive pulmonary disease)   . Lung mass     right lower lobe spiculated mass  . Chronic kidney disease     Stage II  . Oxygen dependent   . CHF (congestive heart failure)     Diastolic Heart failure, Hx of severe MR;  Echocardiogram 05/01/11: Mild LVH, EF 55-60%, mild AI, severe MR, severe LAE, moderate RAE, normal RVSF, moderate-severe TR, PASP 79, severe pulmonary hypertension  . Hypertension   . Osteoporosis   . Alcoholic hepatitis   . Heart murmur   . Renal insufficiency      Past Surgical History  Procedure Date  . Arteriovenous graft placement   . Abdominal aortic aneurysm repair   . Bladder surgery   . Neck/back surgery        Review of Systems:  Neurologic: denies symptoms of TIA, amaurosis, or stroke Cardiac:denies shortness of breath or chest pain Pulmonary: denies cough or wheeze Abdomen: denies  abdominal pain nausea or vomiting  History   Social History  . Marital Status: Single    Spouse Name: N/A    Number of Children: 0  . Years of Education: N/A   Occupational History  . retired    Social History Main Topics  . Smoking status: Former Smoker -- 0.5 packs/day for 20 years    Types: Cigarettes    Quit date: 08/26/2009  . Smokeless tobacco: Never Used  . Alcohol Use: No     history of ETOH abuse in the past  . Drug Use: No  . Sexually Active: Not on file   Other Topics Concern  . Not on file   Social History Narrative   Lives alone.     No Known Allergies  Current Outpatient Prescriptions on File Prior to Visit  Medication Sig Dispense Refill  . albuterol (PROAIR HFA) 108 (90 BASE) MCG/ACT inhaler Inhale 2 puffs into the lungs every 6 (six) hours as needed for wheezing.  1 Inhaler  6  . albuterol (PROVENTIL) 2 MG tablet Take 1 tablet (2 mg total) by mouth 3 (three) times daily.  1 tablet  6  . budesonide-formoterol (SYMBICORT) 160-4.5 MCG/ACT inhaler Inhale 2 puffs into the lungs 2 (two) times daily.  1 Inhaler  12  . furosemide (LASIX) 80 MG tablet Take 1 tablet (80 mg total) by mouth daily.      Marland Kitchen  amLODipine (NORVASC) 10 MG tablet Take 1 tablet (10 mg total) by mouth daily.  30 tablet  11  . hydrALAZINE (APRESOLINE) 25 MG tablet Take 1 tablet (25 mg total) by mouth 3 (three) times daily.  90 tablet  11  . pantoprazole (PROTONIX) 40 MG tablet Take 40 mg by mouth 2 (two) times daily.       . predniSONE (DELTASONE) 10 MG tablet Take 10 mg by mouth daily.        Marland Kitchen tiotropium (SPIRIVA HANDIHALER) 18 MCG inhalation capsule Place 1 capsule (18 mcg total) into inhaler and inhale daily.  30 capsule  2       Physical Examination    Filed Vitals:   08/01/11 1308  BP: 173/85  Pulse: 76  Resp: 16  Height: 5\' 9"  (1.753 m)  Weight: 155 lb (70.308 kg)  SpO2: 96%     General:  Alert and oriented, no acute distress HEENT: Normal Neck: No bruit or JVD Pulmonary:  Clear to auscultation bilaterally Cardiac: Regular Rate and Rhythm without murmur Abdomen: Soft, non-tender, non-distended, normal bowel sounds, no pulsatile mass Extremities: 2+ femoral pulses   DATA:  CT angiogram the abdomen and pelvis images were reviewed today. Recent CT scan shows aneurysm diameter of stroke from 5.1-4.5 cm in diameter. There is no evidence of type I or type II endoleak. The top portion of the stent graft is adjacent to the renal arteries and there is no evidence of migration.   ASSESSMENT: Doing well status post Gore Excluder stent graft repair. Doing well status post Gore Excluder stent graft repair aneurysm   PLAN: Patient will return in 6 months to review his stent graft with a repeat CT angiogram.  Fabienne Bruns, MD [facility] Office: [phone] Pager: [phone]

## 2011-08-06 ENCOUNTER — Telehealth: Payer: Self-pay | Admitting: Pulmonary Disease

## 2011-08-06 NOTE — Telephone Encounter (Signed)
[name], please call Dr. Beverley Fiedler nurse and let her know that I have reviewed ct chest from 04/2011 and there is no spiculated mass in RLL.  He did have RML atelectasis and had a negative bronchoscopy for endobronchial lesion He has had f/u chest xrays with no RLL mass noted.   I do not know where Dr. Darrick Penna got the idea for a RLL mass??? Have her pass this on to Dr. Barbaraann Barthel.

## 2011-08-07 NOTE — Telephone Encounter (Signed)
Called Dr. Barbaraann Barthel office at [phone] and LM with receptionist for Dr. Barbaraann Barthel nurse to call back.

## 2011-08-09 NOTE — Telephone Encounter (Signed)
Called and spoke with Vernona Rieger at Dr. Ephriam Knuckles office and informed her of [name]'s response.  She typed down [name]'s response and sent it to Dr. Ephriam Knuckles for her review.

## 2011-10-12 ENCOUNTER — Emergency Department (HOSPITAL_COMMUNITY): Payer: Medicare Other

## 2011-10-12 ENCOUNTER — Encounter (HOSPITAL_COMMUNITY): Payer: Self-pay | Admitting: Emergency Medicine

## 2011-10-12 ENCOUNTER — Emergency Department (HOSPITAL_COMMUNITY)
Admission: EM | Admit: 2011-10-12 | Discharge: 2011-10-13 | Disposition: A | Discharge status: Home / Self Care | Payer: Medicare Other | Attending: Emergency Medicine | Admitting: Emergency Medicine

## 2011-10-12 DIAGNOSIS — M1710 Unilateral primary osteoarthritis, unspecified knee: Secondary | ICD-10-CM | POA: Insufficient documentation

## 2011-10-12 DIAGNOSIS — I4891 Unspecified atrial fibrillation: Secondary | ICD-10-CM | POA: Insufficient documentation

## 2011-10-12 DIAGNOSIS — J449 Chronic obstructive pulmonary disease, unspecified: Secondary | ICD-10-CM

## 2011-10-12 DIAGNOSIS — N182 Chronic kidney disease, stage 2 (mild): Secondary | ICD-10-CM | POA: Insufficient documentation

## 2011-10-12 DIAGNOSIS — I129 Hypertensive chronic kidney disease with stage 1 through stage 4 chronic kidney disease, or unspecified chronic kidney disease: Secondary | ICD-10-CM | POA: Insufficient documentation

## 2011-10-12 DIAGNOSIS — R05 Cough: Secondary | ICD-10-CM | POA: Insufficient documentation

## 2011-10-12 DIAGNOSIS — I509 Heart failure, unspecified: Secondary | ICD-10-CM | POA: Insufficient documentation

## 2011-10-12 DIAGNOSIS — M25569 Pain in unspecified knee: Secondary | ICD-10-CM | POA: Insufficient documentation

## 2011-10-12 DIAGNOSIS — M1990 Unspecified osteoarthritis, unspecified site: Secondary | ICD-10-CM

## 2011-10-12 DIAGNOSIS — R011 Cardiac murmur, unspecified: Secondary | ICD-10-CM | POA: Insufficient documentation

## 2011-10-12 DIAGNOSIS — M810 Age-related osteoporosis without current pathological fracture: Secondary | ICD-10-CM | POA: Insufficient documentation

## 2011-10-12 DIAGNOSIS — R059 Cough, unspecified: Secondary | ICD-10-CM | POA: Insufficient documentation

## 2011-10-12 DIAGNOSIS — R0602 Shortness of breath: Secondary | ICD-10-CM | POA: Insufficient documentation

## 2011-10-12 DIAGNOSIS — J4489 Other specified chronic obstructive pulmonary disease: Secondary | ICD-10-CM | POA: Insufficient documentation

## 2011-10-12 LAB — CBC
Hemoglobin: 12 g/dL — ABNORMAL LOW (ref 13.0–17.0)
MCH: 31.3 pg (ref 26.0–34.0)
MCV: 94 fL (ref 78.0–100.0)
Platelets: 126 10*3/uL — ABNORMAL LOW (ref 150–400)
RBC: 3.84 MIL/uL — ABNORMAL LOW (ref 4.22–5.81)

## 2011-10-12 LAB — BASIC METABOLIC PANEL
CO2: 22 mEq/L (ref 19–32)
Calcium: 9.8 mg/dL (ref 8.4–10.5)
Creatinine, Ser: 1.84 mg/dL — ABNORMAL HIGH (ref 0.50–1.35)
Glucose, Bld: 83 mg/dL (ref 70–99)

## 2011-10-12 MED ORDER — ALBUTEROL SULFATE (5 MG/ML) 0.5% IN NEBU
5.0000 mg | INHALATION_SOLUTION | Freq: Once | RESPIRATORY_TRACT | Status: AC
  Administered 2011-10-12: 5 mg via RESPIRATORY_TRACT
  Filled 2011-10-12: qty 1

## 2011-10-12 MED ORDER — OXYCODONE-ACETAMINOPHEN 5-325 MG PO TABS
1.0000 | ORAL_TABLET | Freq: Once | ORAL | Status: AC
  Administered 2011-10-12: 1 via ORAL
  Filled 2011-10-12: qty 1

## 2011-10-12 MED ORDER — HYDROCODONE-ACETAMINOPHEN 5-325 MG PO TABS
1.0000 | ORAL_TABLET | Freq: Once | ORAL | Status: AC
  Administered 2011-10-12: 1 via ORAL
  Filled 2011-10-12: qty 1

## 2011-10-12 MED ORDER — PREDNISONE 20 MG PO TABS: 60.0000 mg | ORAL_TABLET | Freq: Every day | ORAL | Status: AC

## 2011-10-12 MED ORDER — PREDNISONE 20 MG PO TABS: 60.0000 mg | ORAL_TABLET | Freq: Once | ORAL | Status: DC

## 2011-10-12 MED ORDER — ALBUTEROL SULFATE HFA 108 (90 BASE) MCG/ACT IN AERS
2.0000 | INHALATION_SPRAY | RESPIRATORY_TRACT | Status: DC | PRN
  Administered 2011-10-12: 2 via RESPIRATORY_TRACT
  Filled 2011-10-12: qty 6.7

## 2011-10-12 MED ORDER — HYDROCODONE-ACETAMINOPHEN 5-325 MG PO TABS: 1.0000 | ORAL_TABLET | Freq: Four times a day (QID) | ORAL | Status: AC | PRN

## 2011-10-12 MED ORDER — PREDNISONE 20 MG PO TABS
60.0000 mg | ORAL_TABLET | Freq: Once | ORAL | Status: AC
  Administered 2011-10-12: 60 mg via ORAL
  Filled 2011-10-12: qty 3

## 2011-10-12 MED ORDER — HYDROCODONE-ACETAMINOPHEN 5-325 MG PO TABS: 1.0000 | ORAL_TABLET | Freq: Once | ORAL | Status: DC

## 2011-10-12 MED ORDER — IPRATROPIUM BROMIDE 0.02 % IN SOLN
0.5000 mg | Freq: Once | RESPIRATORY_TRACT | Status: AC
  Administered 2011-10-12: 0.5 mg via RESPIRATORY_TRACT
  Filled 2011-10-12: qty 2.5

## 2011-10-12 NOTE — ED Notes (Signed)
Pt c/o bil knee pain, st's has had pain for a while.  Painful to walk.  Also c/o shortness of breath. Pt is currently on 02 at home. Speaking in full sentences

## 2011-10-12 NOTE — ED Provider Notes (Signed)
History     CSN: [id number]  Arrival date & time 10/12/11  1623   First MD Initiated Contact with Patient 10/12/11 1710      Chief Complaint  Patient presents with  . Knee Pain   HPI The patient reports several days of cough and shortness of breath.  He reports he has a history of COPD/emphysema and is on 2 L home O2.  He ran out of his inhalers approximately one week ago.  He has a pulmonologist.  He denies fevers and chills.  He denies chest pain.  He denies lower extremity edema.  He has no prior history of PE or DVT.  He also reports pain in his bilateral knees.  He reports his been present for several months.  He reports he has a diagnosis of arthritis but his pain medicine at home is not improving.  Nothing worsens the symptoms.  Nothing improves his symptoms.  Symptoms are constant   Past Medical History  Diagnosis Date  . Abdominal aortic aneurysm     s/p stent graft repair in 6/12  . Iliac artery aneurysm, bilateral   . Anemia   . Atrial fibrillation     no coumadin due to h/o GI bleeding  . Tibial plateau fracture     non displaced  . COPD (chronic obstructive pulmonary disease)   . Lung mass     right lower lobe spiculated mass  . Chronic kidney disease     Stage II  . Oxygen dependent   . CHF (congestive heart failure)     Diastolic Heart failure, Hx of severe MR;  Echocardiogram 05/01/11: Mild LVH, EF 55-60%, mild AI, severe MR, severe LAE, moderate RAE, normal RVSF, moderate-severe TR, PASP 79, severe pulmonary hypertension  . Hypertension   . Osteoporosis   . Alcoholic hepatitis   . Heart murmur   . Renal insufficiency     Past Surgical History  Procedure Date  . Arteriovenous graft placement   . Abdominal aortic aneurysm repair   . Bladder surgery   . Neck/back surgery     Family History  Problem Relation Age of Onset  . Emphysema Father   . Heart disease Mother   . Rheum arthritis Mother   . Leukemia Father     History  Substance Use Topics  .  Smoking status: Former Smoker -- 0.5 packs/day for 20 years    Types: Cigarettes    Quit date: 08/26/2009  . Smokeless tobacco: Never Used  . Alcohol Use: No     history of ETOH abuse in the past      Review of Systems  All other systems reviewed and are negative.    Allergies  Review of patient's allergies indicates no known allergies.  Home Medications   Current Outpatient Rx  Name Route Sig Dispense Refill  . ALBUTEROL SULFATE HFA 108 (90 BASE) MCG/ACT IN AERS Inhalation Inhale 2 puffs into the lungs every 6 (six) hours as needed. For shortness of breath    . ALBUTEROL SULFATE 2 MG PO TABS Oral Take 2 mg by mouth 3 (three) times daily.    Marland Kitchen AMLODIPINE BESYLATE 10 MG PO TABS Oral Take 10 mg by mouth daily.    . BUDESONIDE-FORMOTEROL FUMARATE 160-4.5 MCG/ACT IN AERO Inhalation Inhale 2 puffs into the lungs 2 (two) times daily.    . FUROSEMIDE 80 MG PO TABS Oral Take 1 tablet (80 mg total) by mouth daily.    Marland Kitchen HYDRALAZINE HCL 25 MG PO  TABS Oral Take 25 mg by mouth 3 (three) times daily.    Marland Kitchen PANTOPRAZOLE SODIUM 40 MG PO TBEC Oral Take 40 mg by mouth 2 (two) times daily.     Marland Kitchen TIOTROPIUM BROMIDE MONOHYDRATE 18 MCG IN CAPS Inhalation Place 18 mcg into inhaler and inhale daily.      BP 142/106  Pulse 82  Temp(Src) 98.7 F (37.1 C) (Oral)  Resp 20  SpO2 96%  Physical Exam  Nursing note and vitals reviewed. Constitutional: He is oriented to person, place, and time. He appears well-developed and well-nourished.  HENT:  Head: Normocephalic and atraumatic.  Eyes: EOM are normal.  Neck: Normal range of motion.  Cardiovascular: Normal rate, regular rhythm, normal heart sounds and intact distal pulses.   Pulmonary/Chest: Effort normal and breath sounds normal.       Mild wheezing and decreased breath sounds in the bases.  Prolonged expiration  Abdominal: Soft. He exhibits no distension. There is no tenderness.  Musculoskeletal: Normal range of motion.       Bilateral knees  without evidence of effusion.  Full range of motion bilateral knees.  Lower extremities are neurovascularly intact with normal PT and DP pulses bilaterally.  His feet are perfused.  Neurological: He is alert and oriented to person, place, and time.  Skin: Skin is warm and dry.  Psychiatric: He has a normal mood and affect. Judgment normal.    ED Course  Procedures (including critical care time)  Labs Reviewed  CBC - Abnormal; Notable for the following:    RBC 3.84 (*)    Hemoglobin 12.0 (*)    HCT 36.1 (*)    Platelets 126 (*)    All other components within normal limits  BASIC METABOLIC PANEL - Abnormal; Notable for the following:    Creatinine, Ser 1.84 (*)    GFR calc non Af Amer 34 (*)    GFR calc Af Amer 40 (*)    All other components within normal limits  LAB REPORT - SCANNED   Dg Chest 2 View  10/12/2011  *RADIOLOGY REPORT*  Clinical Data: Cough.  Congestion.  Short of breath.  CHEST - 2 VIEW  Comparison: CT 05/01/2011.  Chest radiograph 04/30/2011.  Findings: Tortuous thoracic aorta.  Enlarged pulmonary arteries compatible with pulmonary arterial hypertension.  Emphysema. Bilateral basilar atelectasis.  No airspace disease.  No effusion. Surgical clips are present in the upper abdomen.  Vascular stent is also partially visualized, likely any tortuous abdominal aorta.  IMPRESSION: Stable appearance of the chest.  No acute cardiopulmonary disease. Pulmonary arterial hypertension and tortuous thoracic aorta.  Original Report Authenticated By: Andreas Newport, M.D.   Dg Knee Complete 4 Views Left  10/12/2011  *RADIOLOGY REPORT*  Clinical Data: Left knee pain and swelling.  LEFT KNEE - COMPLETE 4+ VIEW  Comparison: Left knee radiographs performed 01/13/2011  Findings: There is no evidence of fracture or dislocation.  There is now complete loss of the joint space at the medial compartment, with associated sclerotic change; this appears mildly worsened from the prior study.  Additional  mild marginal osteophytes are noted arising all three compartments.  No significant joint effusion is seen; the previously noted joint effusion has resolved. New calcification posterior to the knee joint may reflect loose bodies within a Baker's cyst.  Diffuse vascular calcifications are seen.  IMPRESSION:  1.  No evidence of fracture or dislocation. 2.  Mildly worsened degenerative osteoarthritis at the left knee, with complete loss of the joint space at  the medial compartment. 3.  Previously noted joint effusion has resolved; new calcification posterior to the knee joint may reflect loose bodies within a Baker's cyst. 4.  Diffuse vascular calcifications seen.  Original Report Authenticated By: Tonia Ghent, M.D.   I personally reviewed the x-ray  1. COPD (chronic obstructive pulmonary disease)   2. Arthritis       MDM  Patient is on adult wheeze protocol.  This appears to be a superior exacerbation.  Labs and chest x-ray pending.  He is currently getting respiratory treatments.  He'll be placed on prednisone.  Care continued in CDU        Lyanne Co, MD 10/13/11 1258

## 2011-10-12 NOTE — ED Provider Notes (Signed)
Patient still wheezing. Complains of knee pain present for 2 months, worse in left with intermittent swelling. Patient requests x-ray. Second treatment ordered. He appearsNAD.  Re-eval.: patient feels better after 2nd breathing treatment. Knee x-ray shows arthritis without acute sign of injury. He can be discharged home.   Rodena Medin, PA-C 10/14/11 1535

## 2011-10-12 NOTE — Discharge Instructions (Signed)
YOU CAN BE DISCHARGED HOME AND SHOULD FOLLOW UP WITH YOUR DOCTOR IN 2-3 DAYS FOR RECHECK OF SHORTNESS OF BREATH. tAKE PREDNISONE AS PRESCRIBED AND CONTINUE OTHER MEDICATIONS REGULARLY. RETURN HERE WITH ANY CHEST PAIN, WORSENING SHORTNESS OF BREATH OR NEW CONCERN.  Chronic Obstructive Pulmonary Disease Chronic obstructive pulmonary disease (COPD) is a condition in which airflow from the lungs is restricted. The lungs can never return to normal, but there are measures you can take which will improve them and make you feel better. CAUSES   Smoking.   Exposure to secondhand smoke.   Breathing in irritants (pollution, cigarette smoke, strong smells, aerosol sprays, paint fumes).   History of lung infections.  TREATMENT  Treatment focuses on making you comfortable (supportive care). Your caregiver may prescribe medications (inhaled or pills) to help improve your breathing. HOME CARE INSTRUCTIONS   If you smoke, stop smoking.   Avoid exposure to smoke, chemicals, and fumes that aggravate your breathing.   Take antibiotic medicines as directed by your caregiver.   Avoid medicines that dry up your system and slow down the elimination of secretions (antihistamines and cough syrups). This decreases respiratory capacity and may lead to infections.   Drink enough water and fluids to keep your urine clear or pale yellow. This loosens secretions.   Use humidifiers at home and at your bedside if they do not make breathing difficult.   Receive all protective vaccines your caregiver suggests, especially pneumococcal and influenza.   Use home oxygen as suggested.   Stay active. Exercise and physical activity will help maintain your ability to do things you want to do.   Eat a healthy diet.  SEEK MEDICAL CARE IF:   You develop pus-like mucus (sputum).   Breathing is more labored or exercise becomes difficult to do.   You are running out of the medicine you take for your breathing.  SEEK  IMMEDIATE MEDICAL CARE IF:   You have a rapid heart rate.   You have agitation, confusion, tremors, or are in a stupor (family members may need to observe this).   It becomes difficult to breathe.   You develop chest pain.   You have a fever.  MAKE SURE YOU:   Understand these instructions.   Will watch your condition.   Will get help right away if you are not doing well or get worse.  Document Released: 05/22/2005 Document Revised: 04/24/2011 Document Reviewed: 10/12/2010 [facility] Patient Information 2012 [place], [place].  Arthritis, Nonspecific Arthritis is pain, redness, warmth, or puffiness (swelling) of a joint. The joint may be stiff or hurt when you move it. One or more joints may be affected. There are many types of arthritis. Your doctor may not know what type you have right away. The most common cause of arthritis is wear and tear on the joint (osteoarthritis). HOME CARE   Only take medicine as told by your doctor.   Rest the joint as much as possible.   Raise (elevate) your joint if it is puffy.   Use crutches if the painful joint is in your leg.   Drink enough water and fluids to keep your pee (urine) clear or pale yellow.   Follow your doctor's instructions for diet.   Use cold packs for very bad joint pain for 10 to 15 minutes every hour. Ask your doctor if it is okay for you to use hot packs.   Exercise as told by your doctor.   Take a warm shower if you have stiffness  in the morning.   Move your sore joints throughout the day.  GET HELP RIGHT AWAY IF:   You do not feel better in 24 hours or are getting worse.   You are having side effects from your medicine.   You are not getting better with treatment.   You have a fever.   You have very bad joint pain, puffiness, or redness.   Many joints become painful and puffy.   You have very bad back pain or leg weakness.   You cannot control when you poop (bowel movement) or pee (urinate).  MAKE SURE  YOU:   Understand these instructions.   Will watch your condition.   Will get help right away if you are not doing well or get worse.  Document Released: 11/06/2009 Document Revised: 04/24/2011 Document Reviewed: 11/06/2009 [facility] Patient Information 2012 [place], [place].

## 2011-10-12 NOTE — ED Notes (Signed)
Pt states understanding of discharge instructions 

## 2011-10-15 NOTE — ED Provider Notes (Signed)
I was the primary provider of this patient during this ER visit. The patients care was continued in the CDU and managed in conjunction with my midlevel providers   Lyanne Co, MD 10/15/11 [phone]

## 2011-11-01 ENCOUNTER — Encounter: Payer: Self-pay | Admitting: Cardiology

## 2011-11-16 ENCOUNTER — Encounter (HOSPITAL_COMMUNITY): Payer: Self-pay | Admitting: Emergency Medicine

## 2011-11-16 ENCOUNTER — Inpatient Hospital Stay (HOSPITAL_COMMUNITY)
Admission: EM | Admit: 2011-11-16 | Discharge: 2011-11-22 | DRG: 309 | Disposition: A | Discharge status: Home / Self Care | Payer: Medicare Other | Attending: Internal Medicine | Admitting: Internal Medicine

## 2011-11-16 ENCOUNTER — Other Ambulatory Visit: Payer: Self-pay

## 2011-11-16 DIAGNOSIS — N183 Chronic kidney disease, stage 3 unspecified: Secondary | ICD-10-CM | POA: Diagnosis present

## 2011-11-16 DIAGNOSIS — I4729 Other ventricular tachycardia: Secondary | ICD-10-CM

## 2011-11-16 DIAGNOSIS — J4489 Other specified chronic obstructive pulmonary disease: Secondary | ICD-10-CM | POA: Diagnosis present

## 2011-11-16 DIAGNOSIS — I472 Ventricular tachycardia, unspecified: Secondary | ICD-10-CM | POA: Diagnosis present

## 2011-11-16 DIAGNOSIS — J449 Chronic obstructive pulmonary disease, unspecified: Secondary | ICD-10-CM

## 2011-11-16 DIAGNOSIS — I10 Essential (primary) hypertension: Secondary | ICD-10-CM

## 2011-11-16 DIAGNOSIS — R0600 Dyspnea, unspecified: Secondary | ICD-10-CM

## 2011-11-16 DIAGNOSIS — I4891 Unspecified atrial fibrillation: Principal | ICD-10-CM

## 2011-11-16 DIAGNOSIS — N390 Urinary tract infection, site not specified: Secondary | ICD-10-CM | POA: Diagnosis present

## 2011-11-16 DIAGNOSIS — D649 Anemia, unspecified: Secondary | ICD-10-CM

## 2011-11-16 DIAGNOSIS — I2789 Other specified pulmonary heart diseases: Secondary | ICD-10-CM | POA: Diagnosis present

## 2011-11-16 DIAGNOSIS — I059 Rheumatic mitral valve disease, unspecified: Secondary | ICD-10-CM | POA: Diagnosis present

## 2011-11-16 DIAGNOSIS — D72829 Elevated white blood cell count, unspecified: Secondary | ICD-10-CM

## 2011-11-16 DIAGNOSIS — I079 Rheumatic tricuspid valve disease, unspecified: Secondary | ICD-10-CM | POA: Diagnosis present

## 2011-11-16 DIAGNOSIS — I340 Nonrheumatic mitral (valve) insufficiency: Secondary | ICD-10-CM

## 2011-11-16 DIAGNOSIS — M1710 Unilateral primary osteoarthritis, unspecified knee: Secondary | ICD-10-CM | POA: Diagnosis present

## 2011-11-16 DIAGNOSIS — N189 Chronic kidney disease, unspecified: Secondary | ICD-10-CM

## 2011-11-16 DIAGNOSIS — Z8679 Personal history of other diseases of the circulatory system: Secondary | ICD-10-CM

## 2011-11-16 DIAGNOSIS — A498 Other bacterial infections of unspecified site: Secondary | ICD-10-CM | POA: Diagnosis present

## 2011-11-16 DIAGNOSIS — I509 Heart failure, unspecified: Secondary | ICD-10-CM

## 2011-11-16 DIAGNOSIS — K2211 Ulcer of esophagus with bleeding: Secondary | ICD-10-CM

## 2011-11-16 DIAGNOSIS — I5032 Chronic diastolic (congestive) heart failure: Secondary | ICD-10-CM

## 2011-11-16 DIAGNOSIS — Z9981 Dependence on supplemental oxygen: Secondary | ICD-10-CM

## 2011-11-16 DIAGNOSIS — I129 Hypertensive chronic kidney disease with stage 1 through stage 4 chronic kidney disease, or unspecified chronic kidney disease: Secondary | ICD-10-CM | POA: Diagnosis present

## 2011-11-16 DIAGNOSIS — R0609 Other forms of dyspnea: Secondary | ICD-10-CM

## 2011-11-16 DIAGNOSIS — J441 Chronic obstructive pulmonary disease with (acute) exacerbation: Secondary | ICD-10-CM

## 2011-11-16 NOTE — ED Notes (Signed)
Pt c/o leg pain that started months ago. Pt received shots in knees 2 or 3 weeks for chronic knee pain. Pt is SOB on exertion and lives with oxygen at 2L/M. Hx COPD

## 2011-11-17 ENCOUNTER — Encounter (HOSPITAL_COMMUNITY): Payer: Self-pay | Admitting: Internal Medicine

## 2011-11-17 ENCOUNTER — Emergency Department (HOSPITAL_COMMUNITY): Payer: Medicare Other

## 2011-11-17 DIAGNOSIS — D72829 Elevated white blood cell count, unspecified: Secondary | ICD-10-CM | POA: Diagnosis present

## 2011-11-17 DIAGNOSIS — I4891 Unspecified atrial fibrillation: Secondary | ICD-10-CM | POA: Diagnosis present

## 2011-11-17 LAB — DIFFERENTIAL
Basophils Relative: 0 % (ref 0–1)
Lymphs Abs: 0.9 10*3/uL (ref 0.7–4.0)
Monocytes Absolute: 0.8 10*3/uL (ref 0.1–1.0)
Monocytes Relative: 4 % (ref 3–12)
Neutro Abs: 17.7 10*3/uL — ABNORMAL HIGH (ref 1.7–7.7)

## 2011-11-17 LAB — URINALYSIS, ROUTINE W REFLEX MICROSCOPIC
Glucose, UA: NEGATIVE mg/dL
Hgb urine dipstick: NEGATIVE
Leukocytes, UA: NEGATIVE
Protein, ur: 100 mg/dL — AB
Specific Gravity, Urine: 1.045 — ABNORMAL HIGH (ref 1.005–1.030)
pH: 5.5 (ref 5.0–8.0)

## 2011-11-17 LAB — BLOOD GAS, ARTERIAL
Acid-base deficit: 1.9 mmol/L (ref 0.0–2.0)
Drawn by: 336861
O2 Content: 2 L/min
O2 Saturation: 94.1 %
Patient temperature: 98.6

## 2011-11-17 LAB — BASIC METABOLIC PANEL
BUN: 26 mg/dL — ABNORMAL HIGH (ref 6–23)
Chloride: 98 mEq/L (ref 96–112)
Creatinine, Ser: 1.75 mg/dL — ABNORMAL HIGH (ref 0.50–1.35)
GFR calc Af Amer: 42 mL/min — ABNORMAL LOW (ref >90)
Glucose, Bld: 90 mg/dL (ref 70–99)

## 2011-11-17 LAB — D-DIMER, QUANTITATIVE: D-Dimer, Quant: 1.93 ug/mL-FEU — ABNORMAL HIGH (ref 0.00–0.48)

## 2011-11-17 LAB — URINE MICROSCOPIC-ADD ON

## 2011-11-17 LAB — TROPONIN I: Troponin I: <0.3 ng/mL (ref <0.30)

## 2011-11-17 LAB — CBC
HCT: 35.1 % — ABNORMAL LOW (ref 39.0–52.0)
Hemoglobin: 11.2 g/dL — ABNORMAL LOW (ref 13.0–17.0)
MCH: 30.4 pg (ref 26.0–34.0)
MCHC: 31.9 g/dL (ref 30.0–36.0)
RBC: 3.68 MIL/uL — ABNORMAL LOW (ref 4.22–5.81)

## 2011-11-17 LAB — PRO B NATRIURETIC PEPTIDE: Pro B Natriuretic peptide (BNP): 4938 pg/mL — ABNORMAL HIGH (ref 0–125)

## 2011-11-17 MED ORDER — TIOTROPIUM BROMIDE MONOHYDRATE 18 MCG IN CAPS
18.0000 ug | ORAL_CAPSULE | Freq: Every day | RESPIRATORY_TRACT | Status: DC
  Administered 2011-11-17 – 2011-11-22 (×6): 18 ug via RESPIRATORY_TRACT
  Filled 2011-11-17 (×2): qty 5

## 2011-11-17 MED ORDER — ALBUTEROL SULFATE (5 MG/ML) 0.5% IN NEBU
2.5000 mg | INHALATION_SOLUTION | Freq: Once | RESPIRATORY_TRACT | Status: AC
  Administered 2011-11-17: 2.5 mg via RESPIRATORY_TRACT
  Filled 2011-11-17: qty 0.5

## 2011-11-17 MED ORDER — SODIUM CHLORIDE 0.9 % IJ SOLN
3.0000 mL | Freq: Two times a day (BID) | INTRAMUSCULAR | Status: DC
  Administered 2011-11-17 – 2011-11-21 (×4): 3 mL via INTRAVENOUS

## 2011-11-17 MED ORDER — GUAIFENESIN ER 600 MG PO TB12
600.0000 mg | ORAL_TABLET | Freq: Two times a day (BID) | ORAL | Status: DC
  Administered 2011-11-17 – 2011-11-22 (×11): 600 mg via ORAL
  Filled 2011-11-17 (×12): qty 1

## 2011-11-17 MED ORDER — ASPIRIN EC 325 MG PO TBEC
325.0000 mg | DELAYED_RELEASE_TABLET | Freq: Every day | ORAL | Status: DC
  Administered 2011-11-17 – 2011-11-22 (×6): 325 mg via ORAL
  Filled 2011-11-17 (×6): qty 1

## 2011-11-17 MED ORDER — SODIUM CHLORIDE 0.9 % IV SOLN
250.0000 mL | INTRAVENOUS | Status: DC | PRN
  Administered 2011-11-21: 250 mL via INTRAVENOUS

## 2011-11-17 MED ORDER — ONDANSETRON HCL 4 MG PO TABS: 4.0000 mg | ORAL_TABLET | Freq: Four times a day (QID) | ORAL | Status: DC | PRN

## 2011-11-17 MED ORDER — SODIUM CHLORIDE 0.9 % IV SOLN
Freq: Once | INTRAVENOUS | Status: AC
  Administered 2011-11-17: 01:00:00 via INTRAVENOUS

## 2011-11-17 MED ORDER — ACETAMINOPHEN 650 MG RE SUPP: 650.0000 mg | Freq: Four times a day (QID) | RECTAL | Status: DC | PRN

## 2011-11-17 MED ORDER — FUROSEMIDE 80 MG PO TABS
80.0000 mg | ORAL_TABLET | Freq: Every day | ORAL | Status: DC
  Administered 2011-11-17 – 2011-11-22 (×6): 80 mg via ORAL
  Filled 2011-11-17 (×6): qty 1

## 2011-11-17 MED ORDER — DILTIAZEM HCL 25 MG/5ML IV SOLN
10.0000 mg | Freq: Once | INTRAVENOUS | Status: AC
  Administered 2011-11-17: 10 mg via INTRAVENOUS
  Filled 2011-11-17: qty 5

## 2011-11-17 MED ORDER — SENNA 8.6 MG PO TABS
1.0000 | ORAL_TABLET | Freq: Two times a day (BID) | ORAL | Status: DC
  Administered 2011-11-17 – 2011-11-21 (×8): 8.6 mg via ORAL
  Filled 2011-11-17 (×8): qty 1

## 2011-11-17 MED ORDER — DILTIAZEM HCL 100 MG IV SOLR
5.0000 mg/h | INTRAVENOUS | Status: DC
  Administered 2011-11-17: 15 mg/h via INTRAVENOUS
  Administered 2011-11-17: 10 mg/h via INTRAVENOUS
  Administered 2011-11-17: 5 mg/h via INTRAVENOUS
  Administered 2011-11-17 – 2011-11-19 (×5): 15 mg/h via INTRAVENOUS
  Filled 2011-11-17 (×8): qty 100

## 2011-11-17 MED ORDER — AMLODIPINE BESYLATE 10 MG PO TABS
10.0000 mg | ORAL_TABLET | Freq: Every day | ORAL | Status: DC
  Administered 2011-11-17 – 2011-11-18 (×2): 10 mg via ORAL
  Filled 2011-11-17 (×2): qty 1

## 2011-11-17 MED ORDER — IOHEXOL 300 MG/ML  SOLN
80.0000 mL | Freq: Once | INTRAMUSCULAR | Status: AC | PRN
  Administered 2011-11-17: 80 mL via INTRAVENOUS

## 2011-11-17 MED ORDER — SODIUM CHLORIDE 0.9 % IJ SOLN
3.0000 mL | Freq: Two times a day (BID) | INTRAMUSCULAR | Status: DC
  Administered 2011-11-20: 3 mL via INTRAVENOUS

## 2011-11-17 MED ORDER — METHYLPREDNISOLONE SODIUM SUCC 125 MG IJ SOLR
125.0000 mg | Freq: Once | INTRAMUSCULAR | Status: AC
  Administered 2011-11-17: 125 mg via INTRAVENOUS
  Filled 2011-11-17: qty 2

## 2011-11-17 MED ORDER — IPRATROPIUM BROMIDE 0.02 % IN SOLN
0.5000 mg | Freq: Four times a day (QID) | RESPIRATORY_TRACT | Status: DC
  Administered 2011-11-17 (×2): 0.5 mg via RESPIRATORY_TRACT
  Filled 2011-11-17 (×2): qty 2.5

## 2011-11-17 MED ORDER — ONDANSETRON HCL 4 MG/2ML IJ SOLN: 4.0000 mg | Freq: Four times a day (QID) | INTRAMUSCULAR | Status: DC | PRN

## 2011-11-17 MED ORDER — MORPHINE SULFATE 4 MG/ML IJ SOLN
4.0000 mg | Freq: Once | INTRAMUSCULAR | Status: AC
  Administered 2011-11-17: 4 mg via INTRAVENOUS
  Filled 2011-11-17: qty 1

## 2011-11-17 MED ORDER — ACETAMINOPHEN 325 MG PO TABS
650.0000 mg | ORAL_TABLET | Freq: Four times a day (QID) | ORAL | Status: DC | PRN
  Administered 2011-11-17: 650 mg via ORAL
  Filled 2011-11-17: qty 2

## 2011-11-17 MED ORDER — HYDRALAZINE HCL 25 MG PO TABS
25.0000 mg | ORAL_TABLET | Freq: Three times a day (TID) | ORAL | Status: DC
  Administered 2011-11-17 – 2011-11-20 (×10): 25 mg via ORAL
  Filled 2011-11-17 (×15): qty 1

## 2011-11-17 MED ORDER — HEPARIN SODIUM (PORCINE) 5000 UNIT/ML IJ SOLN
5000.0000 [IU] | Freq: Three times a day (TID) | INTRAMUSCULAR | Status: DC
  Administered 2011-11-17 – 2011-11-22 (×16): 5000 [IU] via SUBCUTANEOUS
  Filled 2011-11-17 (×19): qty 1

## 2011-11-17 MED ORDER — SODIUM CHLORIDE 0.9 % IJ SOLN: 3.0000 mL | INTRAMUSCULAR | Status: DC | PRN

## 2011-11-17 MED ORDER — BUDESONIDE-FORMOTEROL FUMARATE 160-4.5 MCG/ACT IN AERO
2.0000 | INHALATION_SPRAY | Freq: Two times a day (BID) | RESPIRATORY_TRACT | Status: DC
  Administered 2011-11-17 – 2011-11-22 (×11): 2 via RESPIRATORY_TRACT
  Filled 2011-11-17: qty 6

## 2011-11-17 MED ORDER — DOCUSATE SODIUM 100 MG PO CAPS
100.0000 mg | ORAL_CAPSULE | Freq: Two times a day (BID) | ORAL | Status: DC
  Administered 2011-11-17 – 2011-11-22 (×11): 100 mg via ORAL
  Filled 2011-11-17 (×12): qty 1

## 2011-11-17 MED ORDER — OXYCODONE HCL 5 MG PO TABS
5.0000 mg | ORAL_TABLET | Freq: Four times a day (QID) | ORAL | Status: DC | PRN
  Administered 2011-11-20: 5 mg via ORAL
  Filled 2011-11-17: qty 1

## 2011-11-17 MED ORDER — ALBUTEROL SULFATE (5 MG/ML) 0.5% IN NEBU: 2.5000 mg | INHALATION_SOLUTION | RESPIRATORY_TRACT | Status: DC | PRN

## 2011-11-17 MED ORDER — ALBUTEROL SULFATE (5 MG/ML) 0.5% IN NEBU
2.5000 mg | INHALATION_SOLUTION | Freq: Four times a day (QID) | RESPIRATORY_TRACT | Status: DC
  Administered 2011-11-17 – 2011-11-18 (×5): 2.5 mg via RESPIRATORY_TRACT
  Filled 2011-11-17 (×5): qty 0.5

## 2011-11-17 MED ORDER — DILTIAZEM HCL 60 MG PO TABS
60.0000 mg | ORAL_TABLET | Freq: Four times a day (QID) | ORAL | Status: DC
  Administered 2011-11-17: 60 mg via ORAL
  Filled 2011-11-17 (×5): qty 1

## 2011-11-17 MED ORDER — IPRATROPIUM BROMIDE 0.02 % IN SOLN
0.5000 mg | Freq: Once | RESPIRATORY_TRACT | Status: AC
  Administered 2011-11-17: 0.5 mg via RESPIRATORY_TRACT
  Filled 2011-11-17: qty 2.5

## 2011-11-17 NOTE — ED Notes (Signed)
Patient transported to CT 

## 2011-11-17 NOTE — ED Provider Notes (Signed)
History     CSN: [id number]  Arrival date & time 11/16/11  2339   First MD Initiated Contact with Patient 11/17/11 0001      Chief Complaint  Patient presents with  . Leg Pain  . Shortness of Breath    (Consider location/radiation/quality/duration/timing/severity/associated sxs/prior treatment) HPI Comments: Patient has a history of COPD on home oxygen presenting with bilateral knee pain has been chronic in nature. He is a history of arthritis in his knees has had cortisone injections in the past. He came in tonight because the pain in his knee is associated really could not walk. He also endorses gradually worsening problems with shortness of breath. He said dyspnea on exertion a nonproductive cough. He denies any chest pain, fever, abdominal pain or back pain. Has a history of AAA repair in June 2012. He arrives in atrial fibrillation with RVR.  The history is provided by the patient.    Past Medical History  Diagnosis Date  . Abdominal aortic aneurysm     s/p stent graft repair in 6/12  . Iliac artery aneurysm, bilateral   . Anemia   . Atrial fibrillation     no coumadin due to h/o GI bleeding. 05/2011 stopped  metoprolol, amiodarone, digoxin, diltiazem due to bradycardia   . Tibial plateau fracture     non displaced  . COPD (chronic obstructive pulmonary disease)   . Lung mass     right lower lobe spiculated mass  . Chronic kidney disease     Stage II  . Oxygen dependent   . CHF (congestive heart failure)     Diastolic Heart failure, Hx of severe MR;  Echocardiogram 05/01/11: Mild LVH, EF 55-60%, mild AI, severe MR, severe LAE, moderate RAE, normal RVSF, moderate-severe TR, PASP 79, severe pulmonary hypertension  . Hypertension   . Osteoporosis   . Alcoholic hepatitis   . Heart murmur   . Renal insufficiency     Past Surgical History  Procedure Date  . Arteriovenous graft placement   . Abdominal aortic aneurysm repair   . Bladder surgery   . Neck/back surgery      Family History  Problem Relation Age of Onset  . Emphysema Father   . Heart disease Mother   . Rheum arthritis Mother   . Leukemia Father     History  Substance Use Topics  . Smoking status: Former Smoker -- 0.5 packs/day for 20 years    Types: Cigarettes    Quit date: 08/26/2009  . Smokeless tobacco: Never Used  . Alcohol Use: No     history of ETOH abuse in the past      Review of Systems  Constitutional: Positive for activity change and appetite change. Negative for fever.  HENT: Negative for congestion and rhinorrhea.   Respiratory: Positive for cough and shortness of breath.   Cardiovascular: Negative for chest pain.  Gastrointestinal: Negative for nausea, vomiting and abdominal pain.  Genitourinary: Negative for dysuria and hematuria.  Musculoskeletal: Positive for myalgias, arthralgias and gait problem. Negative for back pain.  Neurological: Negative for dizziness and headaches.    Allergies  Review of patient's allergies indicates no known allergies.  Home Medications   Current Outpatient Rx  Name Route Sig Dispense Refill  . ALBUTEROL SULFATE HFA 108 (90 BASE) MCG/ACT IN AERS Inhalation Inhale 2 puffs into the lungs every 6 (six) hours as needed. For shortness of breath    . AMLODIPINE BESYLATE 10 MG PO TABS Oral Take 10 mg by  mouth daily.    . BUDESONIDE-FORMOTEROL FUMARATE 160-4.5 MCG/ACT IN AERO Inhalation Inhale 2 puffs into the lungs 2 (two) times daily.    . FUROSEMIDE 80 MG PO TABS Oral Take 1 tablet (80 mg total) by mouth daily.    Marland Kitchen HYDRALAZINE HCL 25 MG PO TABS Oral Take 25 mg by mouth 3 (three) times daily.    Marland Kitchen PANTOPRAZOLE SODIUM 40 MG PO TBEC Oral Take 40 mg by mouth 2 (two) times daily.     Marland Kitchen TIOTROPIUM BROMIDE MONOHYDRATE 18 MCG IN CAPS Inhalation Place 18 mcg into inhaler and inhale daily.    . ALBUTEROL SULFATE 2 MG PO TABS Oral Take 2 mg by mouth 3 (three) times daily.      BP 141/93  Pulse 110  Temp(Src) 97.8 F (36.6 C) (Oral)   Resp 25  Wt 150 lb (68.04 kg)  SpO2 96%  Physical Exam  Constitutional: He is oriented to person, place, and time. He appears well-developed and well-nourished. No distress.  HENT:  Head: Normocephalic and atraumatic.  Mouth/Throat: Oropharynx is clear and moist. No oropharyngeal exudate.  Eyes: Conjunctivae are normal. Pupils are equal, round, and reactive to light.  Neck: Normal range of motion. Neck supple.  Cardiovascular: Normal rate and normal heart sounds.        Irregularly irregular rhythm  Pulmonary/Chest: Effort normal. No respiratory distress. He has wheezes.       Decreased breath sounds throughout with scattered wheezing  Abdominal: Soft. There is no tenderness. There is no rebound and no guarding.  Musculoskeletal: He exhibits tenderness.       Tenderness palpation of bilateral knees diffusely. Small joint effusions, no overlying skin change, no warmth, full range of motion. Distal pulses intact  Neurological: He is alert and oriented to person, place, and time. No cranial nerve deficit.  Skin: Skin is warm.    ED Course  Procedures (including critical care time)  Labs Reviewed  CBC - Abnormal; Notable for the following:    WBC 19.4 (*)    RBC 3.68 (*)    Hemoglobin 11.2 (*)    HCT 35.1 (*)    RDW 16.7 (*)    All other components within normal limits  DIFFERENTIAL - Abnormal; Notable for the following:    Neutrophils Relative 91 (*)    Neutro Abs 17.7 (*)    Lymphocytes Relative 5 (*)    All other components within normal limits  BASIC METABOLIC PANEL - Abnormal; Notable for the following:    Sodium 134 (*)    BUN 26 (*)    Creatinine, Ser 1.75 (*)    GFR calc non Af Amer 37 (*)    GFR calc Af Amer 42 (*)    All other components within normal limits  PROTIME-INR - Abnormal; Notable for the following:    Prothrombin Time 17.0 (*)    All other components within normal limits  PRO B NATRIURETIC PEPTIDE - Abnormal; Notable for the following:    Pro B  Natriuretic peptide (BNP) 4938.0 (*)    All other components within normal limits  D-DIMER, QUANTITATIVE - Abnormal; Notable for the following:    D-Dimer, Quant 1.93 (*)    All other components within normal limits  BLOOD GAS, ARTERIAL - Abnormal; Notable for the following:    pH, Arterial 7.469 (*)    pCO2 arterial 28.7 (*)    pO2, Arterial 68.4 (*)    All other components within normal limits  TROPONIN I  URINALYSIS, ROUTINE W REFLEX MICROSCOPIC   Dg Chest 1 View  11/17/2011  *RADIOLOGY REPORT*  Clinical Data: Short of breath.  Hypoxia.  CHEST - 1 VIEW  Comparison: 10/12/2011  Findings: Moderate cardiomegaly and ectasia of the thoracic aorta are stable.  Mild bibasilar scarring is unchanged.  No evidence of acute infiltrate or edema.  No evidence of pleural effusion.  IMPRESSION: Stable cardiomegaly and bibasilar scarring.  No acute findings.  Original Report Authenticated By: Danae Orleans, M.D.   Ct Angio Chest W/cm &/or Wo Cm  11/17/2011  *RADIOLOGY REPORT*  Clinical Data: Back pain, shortness of breath, leg pain,, question pulmonary embolism  CT ANGIOGRAPHY CHEST  Technique:  Multidetector CT imaging of the chest using the standard protocol during bolus administration of intravenous contrast. Multiplanar reconstructed images including MIPs were obtained and reviewed to evaluate the vascular anatomy.  Contrast:  80 ml Omnipaque 300 IV.  Comparison: CT chest 05/01/2011  Findings: Extensive atherosclerotic calcifications aorta and coronary arteries. Cardiac chambers are enlarged particularly the left atrium. No aortic dissection identified. Aneurysmal dilatation of ascending thoracic aorta 4.3 x 4.0 cm image 54. No thoracic adenopathy. Visualized portion of upper abdomen unremarkable. Dilated central pulmonary arteries question pulmonary arterial hypertension.  Pulmonary arteries patent. No evidence of pulmonary embolism. Emphysematous changes with scattered peribronchial thickening. Bibasilar  atelectasis and more focal areas of opacity likely representing infiltrate. Upper lungs clear. Bones demineralized.  IMPRESSION: No evidence of pulmonary embolism. Enlarged central pulmonary arteries question pulmonary arterial hypertension. Aneurysmal dilatation of the ascending thoracic aorta with extensive atherosclerotic calcification of the aorta and coronary arteries. Changes of COPD with bibasilar atelectasis and questionable areas of infiltrate. Enlargement of cardiac chambers.  Original Report Authenticated By: Lollie Marrow, M.D.   Dg Knee Complete 4 Views Left  11/17/2011  *RADIOLOGY REPORT*  Clinical Data: Worsening knee pain.  LEFT KNEE - COMPLETE 4+ VIEW  Comparison: 10/12/2011  Findings: Severe medial compartment osteoarthritis is seen with tibia varus.  Generalized osteopenia is noted.  Small knee joint effusion also demonstrated.  No evidence of fracture or dislocation.  Peripheral vascular calcification again noted.  IMPRESSION:  1.  No acute findings. 2.  Severe medial compartment osteoarthritis and small knee joint effusion. 3.  Osteopenia and peripheral vascular calcification.  Original Report Authenticated By: Danae Orleans, M.D.   Dg Knee Complete 4 Views Right  11/17/2011  *RADIOLOGY REPORT*  Clinical Data: Worsening knee pain.  RIGHT KNEE - COMPLETE 4+ VIEW  Comparison: None.  Findings: No evidence of fracture or dislocation.  Small knee joint effusion noted.  Severe medial compartment osteoarthritis is seen with tibia varus.  Peripheral vascular calcification also noted. Osteopenia also demonstrated.  IMPRESSION:  1.  No acute findings. 2.  Severe medial compartment osteoarthritis and small knee joint effusion. 3.  Osteopenia and peripheral vascular calcification.  Original Report Authenticated By: Danae Orleans, M.D.     1. Atrial fibrillation with rapid ventricular response   2. COPD exacerbation       MDM  Chronic knee pain without evidence of septic joint. COPD  exacerbation with atrial fibrillation and RVR.  Patient given IV Cardizem bolus and drip for atrial fibrillation. Heart rate has decreased to the 100s. Blood pressure remained stable. Knee xrays show chronic osteoarthritis changes. No evidence of septic joint on exam. Also treat patient for COPD exacerbation with nebulizers and steroids. He'll need admission for control of his rapid heart rate.   Date: 11/17/2011  Rate: 123  Rhythm:  atrial fibrillation  QRS Axis: normal  Intervals: normal  ST/T Wave abnormalities: nonspecific ST/T changes  Conduction Disutrbances:none  Narrative Interpretation: LVH, Afib with RVR  Old EKG Reviewed: changes noted  CRITICAL CARE Performed by: Glynn Octave   Total critical care time: 30  Critical care time was exclusive of separately billable procedures and treating other patients.  Critical care was necessary to treat or prevent imminent or life-threatening deterioration.  Critical care was time spent personally by me on the following activities: development of treatment plan with patient and/or surrogate as well as nursing, discussions with consultants, evaluation of patient's response to treatment, examination of patient, obtaining history from patient or surrogate, ordering and performing treatments and interventions, ordering and review of laboratory studies, ordering and review of radiographic studies, pulse oximetry and re-evaluation of patient's condition.       Glynn Octave, MD 11/17/11 [phone]

## 2011-11-17 NOTE — Progress Notes (Signed)
PROGRESS NOTE  [name] JXB:[id number] DOB: 23-Sep-1935 DOA: 11/16/2011 PCP: Beverley Fiedler, MD, MD Pulmonologist: Marcelyn Bruins, M.D. Vascular surgeon: Fabienne Bruns, M.D. Cardiologist: Valera Castle, M.D.  Brief narrative: 76 year old man presented to the emergency department with bilateral knee pain, chronic. Found to have atrial fibrillation with rapid ventricular response and referred for admission.  Chart review:  05/30/2011-05/02/2011 hospitalization: Bradycardia secondary to medications (metoprolol, digoxin, diltiazem, amiodarone), proximal atrial fibrillation (not a Coumadin candidate secondary to history of GI bleed). Discharge medications included now rate control agents.  04/2011 hospitalization: Acute COPD exacerbation, atrial fibrillation rate controlled  Past medical history: Paroxysmal atrial fibrillation (not a warfarin candidate secondary to history of GI bleed), bradycardia requiring the discontinuation of rate control agents, oxygen-dependent COPD, chronic kidney disease stage III, anemia, alcohol abuse, smoker, diastolic congestive heart failure, or mitral rotation, severe tricuspid regurgitation, pulmonary hypertension, alcoholic hepatitis, GI bleed  Consultants:    Procedures:  None  Interim History: Chart reviewed in detail and summarized as above. Discussed with RN: no concerns.  Subjective: Knees feel better today. Complains of chronic cough. Breathing at baseline. No palpitations.  Objective: Filed Vitals:   11/17/11 0245 11/17/11 0355 11/17/11 0427 11/17/11 0806  BP:  117/85    Pulse: 110 103    Temp:  97.6 F (36.4 C)    TempSrc:  Oral    Resp:  20    Height:  5\' 8"  (1.727 m)    Weight:  65.7 kg (144 lb 13.5 oz)    SpO2: 96% 94% 94% 92%    Intake/Output Summary (Last 24 hours) at 11/17/11 0932 Last data filed at 11/17/11 0646  Gross per 24 hour  Intake   51.5 ml  Output      0 ml  Net   51.5 ml    Exam:   General:  Appears calm  and comfortable. Watching TV.  Cardiovascular: Tachycardic, regular rhythm. No murmur, rub, gallop. No lower extremity edema.  Telemetry: Atrial fibrillation, ventricular rate 90-110s.  Respiratory: Decreased breath sounds bilaterally but no wheezes, rales, rhonchi. Mild increased work of breathing.  Musculoskeletal: Bilateral knees: No erythema or significant edema. Able to bend both knees without difficulty. No evidence of significant effusion.  Psychiatric: Grossly normal mood and affect. Speech fluent and appropriate.  Data Reviewed: Basic Metabolic Panel:  Lab 11/17/11 1308  NA 134*  K 3.9  CL 98  CO2 23  GLUCOSE 90  BUN 26*  CREATININE 1.75*  CALCIUM 9.3  MG --  PHOS --   CBC:  Lab 11/17/11 0009  WBC 19.4*  NEUTROABS 17.7*  HGB 11.2*  HCT 35.1*  MCV 95.4  PLT 161   Cardiac Enzymes:  Lab 11/17/11 0010  CKTOTAL --  CKMB --  CKMBINDEX --  TROPONINI <0.30   CBG:  Lab 11/17/11 0743  GLUCAP 145*   Studies: Dg Chest 1 View  11/17/2011  *RADIOLOGY REPORT*  Clinical Data: Short of breath.  Hypoxia.  CHEST - 1 VIEW  Comparison: 10/12/2011  Findings: Moderate cardiomegaly and ectasia of the thoracic aorta are stable.  Mild bibasilar scarring is unchanged.  No evidence of acute infiltrate or edema.  No evidence of pleural effusion.  IMPRESSION: Stable cardiomegaly and bibasilar scarring.  No acute findings.  Original Report Authenticated By: Danae Orleans, M.D.   Ct Angio Chest W/cm &/or Wo Cm  11/17/2011  *RADIOLOGY REPORT*  Clinical Data: Back pain, shortness of breath, leg pain,, question pulmonary embolism  CT ANGIOGRAPHY CHEST  Technique:  Multidetector  CT imaging of the chest using the standard protocol during bolus administration of intravenous contrast. Multiplanar reconstructed images including MIPs were obtained and reviewed to evaluate the vascular anatomy.  Contrast:  80 ml Omnipaque 300 IV.  Comparison: CT chest 05/01/2011  Findings: Extensive  atherosclerotic calcifications aorta and coronary arteries. Cardiac chambers are enlarged particularly the left atrium. No aortic dissection identified. Aneurysmal dilatation of ascending thoracic aorta 4.3 x 4.0 cm image 54. No thoracic adenopathy. Visualized portion of upper abdomen unremarkable. Dilated central pulmonary arteries question pulmonary arterial hypertension.  Pulmonary arteries patent. No evidence of pulmonary embolism. Emphysematous changes with scattered peribronchial thickening. Bibasilar atelectasis and more focal areas of opacity likely representing infiltrate. Upper lungs clear. Bones demineralized.  IMPRESSION: No evidence of pulmonary embolism. Enlarged central pulmonary arteries question pulmonary arterial hypertension. Aneurysmal dilatation of the ascending thoracic aorta with extensive atherosclerotic calcification of the aorta and coronary arteries. Changes of COPD with bibasilar atelectasis and questionable areas of infiltrate. Enlargement of cardiac chambers.  Original Report Authenticated By: Lollie Marrow, M.D.   Dg Knee Complete 4 Views Left  11/17/2011  *RADIOLOGY REPORT*  Clinical Data: Worsening knee pain.  LEFT KNEE - COMPLETE 4+ VIEW  Comparison: 10/12/2011  Findings: Severe medial compartment osteoarthritis is seen with tibia varus.  Generalized osteopenia is noted.  Small knee joint effusion also demonstrated.  No evidence of fracture or dislocation.  Peripheral vascular calcification again noted.  IMPRESSION:  1.  No acute findings. 2.  Severe medial compartment osteoarthritis and small knee joint effusion. 3.  Osteopenia and peripheral vascular calcification.  Original Report Authenticated By: Danae Orleans, M.D.   Dg Knee Complete 4 Views Right  11/17/2011  *RADIOLOGY REPORT*  Clinical Data: Worsening knee pain.  RIGHT KNEE - COMPLETE 4+ VIEW  Comparison: None.  Findings: No evidence of fracture or dislocation.  Small knee joint effusion noted.  Severe medial  compartment osteoarthritis is seen with tibia varus.  Peripheral vascular calcification also noted. Osteopenia also demonstrated.  IMPRESSION:  1.  No acute findings. 2.  Severe medial compartment osteoarthritis and small knee joint effusion. 3.  Osteopenia and peripheral vascular calcification.  Original Report Authenticated By: Danae Orleans, M.D.    Scheduled Meds:   . sodium chloride   Intravenous Once  . albuterol  2.5 mg Nebulization Once  . albuterol  2.5 mg Nebulization Q6H  . amLODipine  10 mg Oral Daily  . aspirin EC  325 mg Oral Daily  . budesonide-formoterol  2 puff Inhalation BID  . diltiazem  10 mg Intravenous Once  . diltiazem  60 mg Oral Q6H  . docusate sodium  100 mg Oral BID  . furosemide  80 mg Oral Daily  . guaiFENesin  600 mg Oral BID  . heparin  5,000 Units Subcutaneous Q8H  . hydrALAZINE  25 mg Oral TID  . ipratropium  0.5 mg Nebulization Once  . ipratropium  0.5 mg Nebulization Q6H  . methylPREDNISolone (SOLU-MEDROL) injection  125 mg Intravenous Once  .  morphine injection  4 mg Intravenous Once  . senna  1 tablet Oral BID  . sodium chloride  3 mL Intravenous Q12H  . sodium chloride  3 mL Intravenous Q12H   Continuous Infusions:   . diltiazem (CARDIZEM) infusion 10 mg/hr (11/17/11 0629)   EKG independently reviewed: Atrial fibrillation/flutter, ventricular rate 123, no acute changes.   Assessment/Plan: 1. Atrial fibrillation with rapid ventricular response: Increase diltiazem infusion to 15 mg/hour. Transition to oral diltiazem when  able. Will notify cardiology 03/25. Not a warfarin candidate secondary to GI bleed. TSH within normal limits 05/31/2011. 2. Bilateral knee pain: Improved today. Secondary to osteoarthritis. Physical therapy consultation. Followup with orthopedics as an outpatient. 3. Elevated d-dimer: CT chest was negative. No complaints of lower extremity pain. Patient denied shortness of breath to the admitting physician and was noted to be  breathing comfortably. D-dimer elevation is chronic in nature (elevated 05/23/2011, 05/05/2011). Would not reorder d-dimer in the future as its clinical utility is negligible in this patient. 4. Chronic diastolic heart failure, severe mitral regurgitation, severe tricuspid regurgitation, severe pulmonary hypertension: Compensated. Continue home Lasix and hydralazine. 5. Oxygen-dependent COPD: Stable. Continue oxygen, albuterol, Spiriva, Symbicort. 6. Chronic kidney disease stage III: Appears stable. 7. Ascending aortic aneurysm: Followup as an outpatient.   Code Status: Full code Family Communication: None at bedside Disposition Plan: Home when improved.   Brendia Sacks, MD  [facility] Pager [phone] 11/17/2011, 9:32 AM    LOS: 1 day

## 2011-11-17 NOTE — H&P (Signed)
PCP:  Beverley Fiedler, MD, MD  Marcelyn Bruins pulmonary Fabienne Bruns vascular  PRIMARY CARDIOLOGIST:  Jesse Sans. Wall, MD, [facility]   Chief Complaint:  Bilateral knee pain  HPI: 76yoM with h/o severe emphysema on home O2, diastolic HF/severe MR/severe TR, EF 55-60%  and pulmonary HTN, AFib not on coumadin due to GIB, h/o alcoholic hepatitis, CKD stage  III, and iliac aneurysm and AAA s/p graft repair 01/2011 presented to ED with bilateral  knee pain and found to be in AFib with RVR, leukocytosis.   Of note, pt has been recently seen by Dr. Shelle Iron and found on PFT's to have severe  airflow obsturction, likely emphysema. He states he also was sent to an orthopedist for  bilateral knee pain and was given a steroid shot.   He comes in tonight for bilateral knee pain that is so severe he's having a hard time  walking. It's worse with movement and gets better if he lays down and rests. He doesn't  take anything but tylenol for pain. It was previously more swollen on the left but not so  much now, no erythema or warmth. For worsening pain, he presented to ED.   In the ED, pt was in RVR up to 140, given 10 mg Dilt started Dilt drip, down to 100's.  AFebrile, BP preserved. Chem panel significant for stable renal 26/1.75, BNP 4938,  negative Trop. ABG 7.47 / 29 / 68 / 21. WBC was 19.4 with 91% neutros. INR 1.36. CXR with  stable cardiomegaly, bibasilar scarring, nothing acute. CTA chest with ? pulmonary  arterial HTN, ascending thoracic aorta dilation and aortic/coronary calcifications; COPD  and ? infiltrate; cardiac enlargement. Bilateral knee plain films with chronic changes  but nothing acute. In ED he was also given 125 solumedrol, morphine, given nebs.   He denies chest pain or palpitations. He has chronic dyspnea, but this isn't really worse  than baseline. He has a cough for 3-4 wks productive of whitish sputum, but denies  fevers, chills, sweats, or feeling really ill. He has been  wearing his home O2 No GI  issues, no dysuria, no new rashes. He does feel his face is more swollen recently as his  friends have noted.   Past Medical History  Diagnosis Date  . Abdominal aortic aneurysm     s/p stent graft repair in 6/12  . Iliac artery aneurysm, bilateral   . Anemia   . Atrial fibrillation     no coumadin due to h/o GI bleeding. 05/2011 stopped  metoprolol, amiodarone, digoxin, diltiazem due to bradycardia   . Tibial plateau fracture     non displaced  . COPD (chronic obstructive pulmonary disease)     emphysema and severe outflow obstruction, on home O2  . Lung mass     right lower lobe spiculated mass  . Chronic kidney disease     Stage II  . Oxygen dependent   . CHF (congestive heart failure)     Diastolic Heart failure, Hx of severe MR;  Echocardiogram 05/01/11: Mild LVH, EF 55-60%, mild AI, severe MR, severe LAE, moderate RAE, normal RVSF, moderate-severe TR, PASP 79, severe pulmonary hypertension  . Hypertension   . Osteoporosis   . Alcoholic hepatitis   . Heart murmur   . Renal insufficiency     Past Surgical History  Procedure Date  . Arteriovenous graft placement   . Abdominal aortic aneurysm repair   . Bladder surgery   . Neck/back surgery     Medications:  HOME MEDS:  Prior to Admission medications   Medication Sig Start Date End Date Taking? Authorizing Provider  albuterol (PROVENTIL HFA;VENTOLIN HFA) 108 (90 BASE) MCG/ACT inhaler Inhale 2 puffs into the lungs every 6 (six) hours as needed. For shortness of breath 07/05/11 07/04/12 Yes Barbaraann Share, MD  amLODipine (NORVASC) 10 MG tablet Take 10 mg by mouth daily. 06/17/11 06/16/12 Yes Beatrice Lecher, PA  budesonide-formoterol (SYMBICORT) 160-4.5 MCG/ACT inhaler Inhale 2 puffs into the lungs 2 (two) times daily. 07/05/11 07/04/12 Yes Barbaraann Share, MD  furosemide (LASIX) 80 MG tablet Take 1 tablet (80 mg total) by mouth daily. 06/17/11  Yes Beatrice Lecher, PA  hydrALAZINE (APRESOLINE) 25 MG  tablet Take 25 mg by mouth 3 (three) times daily. 06/17/11 06/16/12 Yes Beatrice Lecher, PA  pantoprazole (PROTONIX) 40 MG tablet Take 40 mg by mouth 2 (two) times daily.    Yes Historical Provider, MD  tiotropium (SPIRIVA) 18 MCG inhalation capsule Place 18 mcg into inhaler and inhale daily. 07/05/11 07/04/12 Yes Barbaraann Share, MD  albuterol (PROVENTIL) 2 MG tablet Take 2 mg by mouth 3 (three) times daily. 07/05/11 07/04/12  Barbaraann Share, MD    Allergies:  No Known Allergies  Social History:   reports that he quit smoking about 2 years ago. His smoking use included Cigarettes. He has a 10 pack-year smoking history. He has never used smokeless tobacco. He reports that he does not drink alcohol or use illicit drugs. Lives alone, but has a sister across the street and a brother also. Never married, no kids. Ambulatory with a cane and walker. Denies smoking, drinking, drugs at presents. Previously had visiting nurse but not now.   Family History: Family History  Problem Relation Age of Onset  . Emphysema Father   . Heart disease Mother   . Rheum arthritis Mother   . Leukemia Father     Physical Exam: Filed Vitals:   11/17/11 0200 11/17/11 0215 11/17/11 0230 11/17/11 0245  BP:  134/88 141/93   Pulse: 109 105 54 110  Temp:      TempSrc:      Resp: 17 19 25    Weight:      SpO2: 95% 95% 92% 96%   Blood pressure 141/93, pulse 110, temperature 97.8 F (36.6 C), temperature source Oral, resp. rate 25, weight 68.04 kg (150 lb), SpO2 96.00%.  Gen: Elderly, pleasant M in no distress, resting comfortably and awoken easily, able to  relate history fairly well, breathing comfortably  HEENT: Pupils round, sclera have muddying, some facial periorbital swelling noted, mouth  moist and normal appearing Lungs: Some coarse inspiratory breath sounds throughout, air movement is fairly good Heart: Regular and tachycardic, but don't much appreciate any abnormal heart sounds or  murmurs Abd: Soft, non  tender, non distended, no facial grimacing Extrem: Warm and perfusing normally, no gross abnormalities, no BLE edema noted Neuro: Alert, attentive, conversant, CN 2-12 intact, moves extremities on his own,  grossly nonfocal     Labs & Imaging Results for orders placed during the hospital encounter of 11/16/11 (from the past 48 hour(s))  CBC     Status: Abnormal   Collection Time   11/17/11 12:09 AM      Component Value Range Comment   WBC 19.4 (*) 4.0 - 10.5 (K/uL)    RBC 3.68 (*) 4.22 - 5.81 (MIL/uL)    Hemoglobin 11.2 (*) 13.0 - 17.0 (g/dL)    HCT 16.1 (*) 09.6 - 52.0 (%)  MCV 95.4  78.0 - 100.0 (fL)    MCH 30.4  26.0 - 34.0 (pg)    MCHC 31.9  30.0 - 36.0 (g/dL)    RDW 16.1 (*) 09.6 - 15.5 (%)    Platelets 161  150 - 400 (K/uL)   DIFFERENTIAL     Status: Abnormal   Collection Time   11/17/11 12:09 AM      Component Value Range Comment   Neutrophils Relative 91 (*) 43 - 77 (%)    Neutro Abs 17.7 (*) 1.7 - 7.7 (K/uL)    Lymphocytes Relative 5 (*) 12 - 46 (%)    Lymphs Abs 0.9  0.7 - 4.0 (K/uL)    Monocytes Relative 4  3 - 12 (%)    Monocytes Absolute 0.8  0.1 - 1.0 (K/uL)    Eosinophils Relative 0  0 - 5 (%)    Eosinophils Absolute 0.0  0.0 - 0.7 (K/uL)    Basophils Relative 0  0 - 1 (%)    Basophils Absolute 0.0  0.0 - 0.1 (K/uL)   BASIC METABOLIC PANEL     Status: Abnormal   Collection Time   11/17/11 12:09 AM      Component Value Range Comment   Sodium 134 (*) 135 - 145 (mEq/L)    Potassium 3.9  3.5 - 5.1 (mEq/L)    Chloride 98  96 - 112 (mEq/L)    CO2 23  19 - 32 (mEq/L)    Glucose, Bld 90  70 - 99 (mg/dL)    BUN 26 (*) 6 - 23 (mg/dL)    Creatinine, Ser 0.45 (*) 0.50 - 1.35 (mg/dL)    Calcium 9.3  8.4 - 10.5 (mg/dL)    GFR calc non Af Amer 37 (*) >90 (mL/min)    GFR calc Af Amer 42 (*) >90 (mL/min)   PROTIME-INR     Status: Abnormal   Collection Time   11/17/11 12:09 AM      Component Value Range Comment   Prothrombin Time 17.0 (*) 11.6 - 15.2 (seconds)    INR  1.36  0.00 - 1.49    D-DIMER, QUANTITATIVE     Status: Abnormal   Collection Time   11/17/11 12:09 AM      Component Value Range Comment   D-Dimer, Quant 1.93 (*) 0.00 - 0.48 (ug/mL-FEU)   TROPONIN I     Status: Normal   Collection Time   11/17/11 12:10 AM      Component Value Range Comment   Troponin I <0.30  <0.30 (ng/mL)   PRO B NATRIURETIC PEPTIDE     Status: Abnormal   Collection Time   11/17/11 12:10 AM      Component Value Range Comment   Pro B Natriuretic peptide (BNP) 4938.0 (*) 0 - 125 (pg/mL)   BLOOD GAS, ARTERIAL     Status: Abnormal   Collection Time   11/17/11 12:53 AM      Component Value Range Comment   O2 Content 2.0      Delivery systems NASAL CANNULA      pH, Arterial 7.469 (*) 7.350 - 7.450     pCO2 arterial 28.7 (*) 35.0 - 45.0 (mmHg)    pO2, Arterial 68.4 (*) 80.0 - 100.0 (mmHg)    Bicarbonate 20.6  20.0 - 24.0 (mEq/L)    TCO2 18.7  0 - 100 (mmol/L)    Acid-base deficit 1.9  0.0 - 2.0 (mmol/L)    O2 Saturation 94.1  Patient temperature 98.6      Collection site RIGHT RADIAL      Drawn by [id number]      Sample type ARTERIAL DRAW      Allens test (pass/fail) PASS  PASS     Dg Chest 1 View  11/17/2011  *RADIOLOGY REPORT*  Clinical Data: Short of breath.  Hypoxia.  CHEST - 1 VIEW  Comparison: 10/12/2011  Findings: Moderate cardiomegaly and ectasia of the thoracic aorta are stable.  Mild bibasilar scarring is unchanged.  No evidence of acute infiltrate or edema.  No evidence of pleural effusion.  IMPRESSION: Stable cardiomegaly and bibasilar scarring.  No acute findings.  Original Report Authenticated By: Danae Orleans, M.D.   Ct Angio Chest W/cm &/or Wo Cm  11/17/2011  *RADIOLOGY REPORT*  Clinical Data: Back pain, shortness of breath, leg pain,, question pulmonary embolism  CT ANGIOGRAPHY CHEST  Technique:  Multidetector CT imaging of the chest using the standard protocol during bolus administration of intravenous contrast. Multiplanar reconstructed images  including MIPs were obtained and reviewed to evaluate the vascular anatomy.  Contrast:  80 ml Omnipaque 300 IV.  Comparison: CT chest 05/01/2011  Findings: Extensive atherosclerotic calcifications aorta and coronary arteries. Cardiac chambers are enlarged particularly the left atrium. No aortic dissection identified. Aneurysmal dilatation of ascending thoracic aorta 4.3 x 4.0 cm image 54. No thoracic adenopathy. Visualized portion of upper abdomen unremarkable. Dilated central pulmonary arteries question pulmonary arterial hypertension.  Pulmonary arteries patent. No evidence of pulmonary embolism. Emphysematous changes with scattered peribronchial thickening. Bibasilar atelectasis and more focal areas of opacity likely representing infiltrate. Upper lungs clear. Bones demineralized.  IMPRESSION: No evidence of pulmonary embolism. Enlarged central pulmonary arteries question pulmonary arterial hypertension. Aneurysmal dilatation of the ascending thoracic aorta with extensive atherosclerotic calcification of the aorta and coronary arteries. Changes of COPD with bibasilar atelectasis and questionable areas of infiltrate. Enlargement of cardiac chambers.  Original Report Authenticated By: Lollie Marrow, M.D.   Dg Knee Complete 4 Views Left  11/17/2011  *RADIOLOGY REPORT*  Clinical Data: Worsening knee pain.  LEFT KNEE - COMPLETE 4+ VIEW  Comparison: 10/12/2011  Findings: Severe medial compartment osteoarthritis is seen with tibia varus.  Generalized osteopenia is noted.  Small knee joint effusion also demonstrated.  No evidence of fracture or dislocation.  Peripheral vascular calcification again noted.  IMPRESSION:  1.  No acute findings. 2.  Severe medial compartment osteoarthritis and small knee joint effusion. 3.  Osteopenia and peripheral vascular calcification.  Original Report Authenticated By: Danae Orleans, M.D.   Dg Knee Complete 4 Views Right  11/17/2011  *RADIOLOGY REPORT*  Clinical Data: Worsening  knee pain.  RIGHT KNEE - COMPLETE 4+ VIEW  Comparison: None.  Findings: No evidence of fracture or dislocation.  Small knee joint effusion noted.  Severe medial compartment osteoarthritis is seen with tibia varus.  Peripheral vascular calcification also noted. Osteopenia also demonstrated.  IMPRESSION:  1.  No acute findings. 2.  Severe medial compartment osteoarthritis and small knee joint effusion. 3.  Osteopenia and peripheral vascular calcification.  Original Report Authenticated By: Danae Orleans, M.D.   01/2010 echo  Study Conclusions  - Left ventricle: The cavity size was normal. Wall thickness was    increased in a pattern of moderate LVH. Systolic function was    normal. The estimated ejection fraction was in the range of 55% to    60%. Wall motion was normal; there were no regional wall motion    abnormalities.  -  Aortic valve: Moderate regurgitation.  - Mitral valve: Calcified annulus. Severe regurgitation.  - Left atrium: The atrium was severely dilated.  - Right ventricle: The cavity size was moderately dilated. Systolic    pressure was increased.  - Right atrium: The atrium was severely dilated.  - Tricuspid valve: Severe regurgitation.  - Pulmonary arteries: PA peak pressure: 43mm Hg (S).   ECG: Narrow complex irregular tachycardia, AFib, normal axis, no P waves, V3 hyperdynamic  T wave but isolated. High lateral TWF, and V5 isolated ST depression, but otherwise no  other specific ischemic signs.   Impression Present on Admission:  .Atrial fibrillation with RVR .Leukocytosis .Chronic kidney disease .COPD (chronic obstructive pulmonary disease) .Severe mitral regurgitation .Chronic diastolic heart failure   76yoM with h/o severe emphysema on home O2, diastolic HF/severe MR/severe TR, EF 55-60%  and pulmonary HTN, AFib not on coumadin due to GIB, h/o alcoholic hepatitis, CKD stage  III, and iliac aneurysm and AAA s/p graft repair 01/2011 presented to ED with bilateral    knee pain and found to be in AFib with RVR, leukocytosis.   1. AFib with RVR: Currently on diltiazem drip. he's not on any nodal agents given h/o  bradycardia and they were all stopped. I'll add an oral diltiazem to get him under  control. Not on coumadin due to GIB, but not sure why not on ASA. Will start 325 for now  as this is more likely beneficial than harmful. CHADS 2-3 for age, diastolic HF, HTN. - Consider cardiology consult in the am - Wean dilt drip as tolerated, start PO dilt 60 q6 and titrate - PT consultation. No IVF's, doesn't appear to need them.   2. Leukocytosis: Frankly, he looks too well to have a WBC count of 19, and denies fevers  or feeling systemically ill. However, does endorse white phlegm production for past  couple weeks and has infiltrate seen on CTA chest. Alternatively he got steroid injection  in his knee (a couple weeks ago though -- of note he feels his face is newly swollen),  takes budesonide inhaler, and got methylpred injection tonight -- so could be steroid  effect. Given his overall non-infected appearance to me, I'm in favor of holding ABx for  now but low threshold to start if he spikes fever, etc.  - Need UA. Holding ABx as above.   3. Emphysema/COPD: Overall don't feel COPD exacerbation is primary problem here.  Nebulizers, mucinex, will not continue steroids for now or give ABx as above.   4. Bilateral knee pain: This seems most consistent with OA. He had steroid shot a few  weeks ago by an ortho MD. This is an outpatient issue, as his knees do not look acute or  infected. Will give some pain med symptomatic relief and defer to outpt.  - PT consult, pain control  5. Diastolic HF with MR and TR, pulmonary HTN: He looks euvolemic at present, CXR without  edema. Continue home lasix, norvasc, hydralazine  6. CKD: Cr appears to be in baseline, not active issue.   SubQ heparin Telemetry, [facility] team 6 Presumed full code  Other plans as per  orders.   Chauncey Sciulli 11/17/2011, 3:49 AM

## 2011-11-18 ENCOUNTER — Encounter (HOSPITAL_COMMUNITY): Payer: Self-pay | Admitting: Cardiology

## 2011-11-18 DIAGNOSIS — I4891 Unspecified atrial fibrillation: Principal | ICD-10-CM

## 2011-11-18 LAB — BASIC METABOLIC PANEL
BUN: 36 mg/dL — ABNORMAL HIGH (ref 6–23)
CO2: 21 mEq/L (ref 19–32)
Chloride: 98 mEq/L (ref 96–112)
GFR calc Af Amer: 47 mL/min — ABNORMAL LOW (ref >90)
Potassium: 3.7 mEq/L (ref 3.5–5.1)

## 2011-11-18 LAB — CBC
HCT: 27.7 % — ABNORMAL LOW (ref 39.0–52.0)
HCT: 28.6 % — ABNORMAL LOW (ref 39.0–52.0)
Hemoglobin: 9 g/dL — ABNORMAL LOW (ref 13.0–17.0)
MCHC: 32.5 g/dL (ref 30.0–36.0)
MCHC: 32.9 g/dL (ref 30.0–36.0)
MCV: 93.3 fL (ref 78.0–100.0)
MCV: 93.2 fL (ref 78.0–100.0)
RDW: 16.3 % — ABNORMAL HIGH (ref 11.5–15.5)

## 2011-11-18 MED ORDER — LEVALBUTEROL HCL 0.63 MG/3ML IN NEBU
0.6300 mg | INHALATION_SOLUTION | Freq: Four times a day (QID) | RESPIRATORY_TRACT | Status: DC | PRN
  Administered 2011-11-22: 0.63 mg via RESPIRATORY_TRACT
  Filled 2011-11-18: qty 3

## 2011-11-18 MED ORDER — LEVOFLOXACIN IN D5W 750 MG/150ML IV SOLN
750.0000 mg | INTRAVENOUS | Status: DC
  Administered 2011-11-18: 750 mg via INTRAVENOUS
  Filled 2011-11-18 (×2): qty 150

## 2011-11-18 MED ORDER — DIPHENHYDRAMINE HCL 25 MG PO CAPS
25.0000 mg | ORAL_CAPSULE | Freq: Four times a day (QID) | ORAL | Status: DC | PRN
  Administered 2011-11-18: 25 mg via ORAL
  Filled 2011-11-18: qty 1

## 2011-11-18 NOTE — Evaluation (Signed)
Physical Therapy Evaluation Patient Details Name: [name] MRN: [id number] DOB: 11/22/1935 Today's Date: 11/18/2011  Problem List:  Patient Active Problem List  Diagnoses  . ANEMIA, SEVERE  . Severe mitral regurgitation  . Paroxysmal atrial fibrillation  . CHF  . ESOPHAGEAL ULCER, WITH BLEEDING  . DOE (dyspnea on exertion)  . Chronic diastolic heart failure  . Hypertension  . Chronic kidney disease  . COPD (chronic obstructive pulmonary disease)  . S/P AAA repair  . Atrial fibrillation with RVR  . Leukocytosis    Past Medical History:  Past Medical History  Diagnosis Date  . Abdominal aortic aneurysm     s/p stent graft repair in 6/12  . Iliac artery aneurysm, bilateral   . Anemia   . Atrial fibrillation     no coumadin due to h/o GI bleeding. 05/2011 stopped  metoprolol, amiodarone, digoxin, diltiazem due to bradycardia   . Tibial plateau fracture     non displaced  . COPD (chronic obstructive pulmonary disease)     emphysema and severe outflow obstruction, on home O2  . Lung mass     right lower lobe spiculated mass  . Chronic kidney disease     Stage II  . Oxygen dependent   . CHF (congestive heart failure)     Diastolic Heart failure, Hx of severe MR;  Echocardiogram 05/01/11: Mild LVH, EF 55-60%, mild AI, severe MR, severe LAE, moderate RAE, normal RVSF, moderate-severe TR, PASP 79, severe pulmonary hypertension  . Hypertension   . Osteoporosis   . Alcoholic hepatitis   . Heart murmur   . Renal insufficiency    Past Surgical History:  Past Surgical History  Procedure Date  . Arteriovenous graft placement   . Abdominal aortic aneurysm repair   . Bladder surgery   . Neck/back surgery     PT Assessment/Plan/Recommendation PT Assessment Clinical Impression Statement: Pt presents with afib with RVR and decreased endurance and mobility.  Pt tolerated ambulation, however became SOB with increasing c/o knee pain with ambulation.  O2 sats remained in the  90's on 2 LO2 via nasal cannula, upon returning to room, noted pts heart rate was 149.  Upon pt resting x 5 mins pts heart rate returned to low 100's.  Pt will benefit from skilled PT in acute venue to address deficits.  PT recommends HHPT vs no follow up pending pt progress.  Pt states that he doesn't think he will want/need follow up therapy.  PT Recommendation/Assessment: Patient will need skilled PT in the acute care venue PT Problem List: Decreased activity tolerance;Decreased balance;Decreased mobility;Decreased safety awareness;Pain;Cardiopulmonary status limiting activity Barriers to Discharge: Decreased caregiver support Barriers to Discharge Comments: Pt states that brother and sister live close and check on him often, however they are not always there with him.  PT Therapy Diagnosis : Difficulty walking;Acute pain;Abnormality of gait PT Plan PT Frequency: Min 3X/week PT Treatment/Interventions: DME instruction;Gait training;Stair training;Functional mobility training;Therapeutic activities;Therapeutic exercise;Balance training;Patient/family education PT Recommendation Follow Up Recommendations: No PT follow up;Home health PT Equipment Recommended: None recommended by PT PT Goals  Acute Rehab PT Goals PT Goal Formulation: With patient Time For Goal Achievement: 2 weeks Pt will Ambulate: >150 feet;with modified independence;with least restrictive assistive device PT Goal: Ambulate - Progress: Goal set today Pt will Go Up / Down Stairs: 1-2 stairs;with modified independence;with least restrictive assistive device PT Goal: Up/Down Stairs - Progress: Goal set today Pt will Perform Home Exercise Program: Independently PT Goal: Perform Home Exercise Program -  Progress: Goal set today  PT Evaluation Precautions/Restrictions  Precautions Precautions: Fall Required Braces or Orthoses: No Restrictions Weight Bearing Restrictions: No Prior Functioning  Home Living Lives With:  Alone Receives Help From: Family (Sister and brother live close) Type of Home: House Home Layout: One level Home Access: Stairs to enter Entrance Stairs-Rails: Can reach both Entrance Stairs-Number of Steps: 2 Home Adaptive Equipment: Walker - rolling;Straight cane;Bedside commode/3-in-1 Prior Function Level of Independence: Independent with basic ADLs;Independent with gait;Independent with transfers Driving: No Vocation: Retired Producer, television/film/video: Awake/alert Overall Cognitive Status: Appears within functional limits for tasks assessed Sensation/Coordination Sensation Light Touch: Appears Intact Coordination Gross Motor Movements are Fluid and Coordinated: Yes Extremity Assessment RLE Assessment RLE Assessment: Within Functional Limits LLE Assessment LLE Assessment: Within Functional Limits Mobility (including Balance) Bed Mobility Bed Mobility: Yes Supine to Sit: 6: Modified independent (Device/Increase time) Transfers Transfers: Yes Sit to Stand: 5: Supervision;With upper extremity assist;From elevated surface;From bed Sit to Stand Details (indicate cue type and reason): Supervision and cues for safety due to pt with slight impulsivity.  Stand to Sit: 5: Supervision;With upper extremity assist;With armrests;To chair/3-in-1 Stand to Sit Details: Supervision and cues for safety and hand placement.  Ambulation/Gait Ambulation/Gait: Yes Ambulation/Gait Assistance: 4: Min assist Ambulation/Gait Assistance Details (indicate cue type and reason): Min/guard for safety with cues for upright posture and pursed lip breathing.  Ambulation Distance (Feet): 180 Feet Assistive device: Straight cane Gait Pattern: Step-through pattern;Antalgic;Decreased stride length (narrow BOS) Gait velocity: decreased Stairs: No Wheelchair Mobility Wheelchair Mobility: No    Exercise    End of Session PT - End of Session Equipment Utilized During Treatment: Gait  belt Activity Tolerance: Patient limited by fatigue;Patient limited by pain Patient left: in chair;with call bell in reach Nurse Communication: Mobility status for transfers;Mobility status for ambulation General Behavior During Session: [employer] for tasks performed Cognition: [employer] for tasks performed  Page, Meribeth Mattes 11/18/2011, 10:25 AM

## 2011-11-18 NOTE — Progress Notes (Signed)
ANTIBIOTIC CONSULT NOTE - INITIAL  Pharmacy Consult for Levaquin Indication: r/o pneumonia  No Known Allergies  Patient Measurements: Height: 5\' 8"  (172.7 cm) Weight: 148 lb 5.9 oz (67.3 kg) (bed scale) IBW/kg (Calculated) : 68.4   Vital Signs: Temp: 97.7 F (36.5 C) (03/25 1442) Temp src: Oral (03/25 1442) BP: 124/76 mmHg (03/25 1442) Pulse Rate: 107  (03/25 1442)  Labs:  Basename 11/18/11 0456 11/17/11 0009  WBC 23.5* 19.4*  HGB 9.0* 11.2*  PLT 154 161  LABCREA -- --  CREATININE 1.62* 1.75*   Estimated Creatinine Clearance: 38.1 ml/min (by C-G formula based on Cr of 1.62).  Microbiology: No results found for this or any previous visit (from the past 720 hour(s)).  Medical History: Past Medical History  Diagnosis Date  . Abdominal aortic aneurysm     s/p stent graft repair in 6/12  . Iliac artery aneurysm, bilateral   . Anemia   . Atrial fibrillation     no coumadin due to h/o GI bleeding. 05/2011 stopped  metoprolol, amiodarone, digoxin, diltiazem due to bradycardia   . Tibial plateau fracture     non displaced  . COPD (chronic obstructive pulmonary disease)     emphysema and severe outflow obstruction, on home O2  . Lung mass     right lower lobe spiculated mass  . Chronic kidney disease     Stage II  . Oxygen dependent   . CHF (congestive heart failure)     Diastolic Heart failure, Hx of severe MR;  Echocardiogram 05/01/11: Mild LVH, EF 55-60%, mild AI, severe MR, severe LAE, moderate RAE, normal RVSF, moderate-severe TR, PASP 79, severe pulmonary hypertension  . Hypertension   . Osteoporosis   . Alcoholic hepatitis     Assessment: 76yom with leukocytosis and question of infiltrate on CT chest to begin Levaquin in setting of renal dysfunction.   SCr has been improving since admission, current CrCl estimate ~38 mL/min.   Goal of Therapy:  dose adjusted per renal clearance  Plan:  Levaquin 750mg  IV q48h.  Clance Boll 11/18/2011,3:40 PM

## 2011-11-18 NOTE — Progress Notes (Signed)
PATIENT DETAILS Name: [name] Age: 76 y.o. Sex: male Date of Birth: 12-19-1935 Admit Date: 11/16/2011 ZOX:[id number], MD, MD POA:    Brief narrative:  76 year old man presented to the emergency department with bilateral knee pain, chronic. Found to have atrial fibrillation with rapid ventricular response and referred for admission.   Chart review:  05/30/2011-05/02/2011 hospitalization: Bradycardia secondary to medications (metoprolol, digoxin, diltiazem, amiodarone), proximal atrial fibrillation (not a Coumadin candidate secondary to history of GI bleed). Discharge medications included now rate control agents.  04/2011 hospitalization: Acute COPD exacerbation, atrial fibrillation rate controlled   Past medical history:  Paroxysmal atrial fibrillation (not a warfarin candidate secondary to history of GI bleed), bradycardia requiring the discontinuation of rate control agents, oxygen-dependent COPD, chronic kidney disease stage III, anemia, alcohol abuse, smoker, diastolic congestive heart failure, or mitral rotation, severe tricuspid regurgitation, pulmonary hypertension, alcoholic hepatitis, GI bleed  Consults: [facility]  Interim history: Still in afib with rate 120's. WBC 23.5<--19.4. Hgb 9.0<--11.2  Subjective: Pt reports sob when up in room this am, but nothing worse than chronic dyspnea. He denies any recent chest pain, fever/chills, melena or hematochezia.  Objective: Vital signs in last 24 hours: Temp:  [97.4 F (36.3 C)-98 F (36.7 C)] 98 F (36.7 C) (03/25 0510) Pulse Rate:  [90-99] 99  (03/25 0510) Resp:  [18] 18  (03/25 0510) BP: (101-114)/(63-67) 114/63 mmHg (03/25 0510) SpO2:  [93 %-96 %] 94 % (03/25 0931) Weight:  [67.3 kg (148 lb 5.9 oz)] 67.3 kg (148 lb 5.9 oz) (03/25 0500) Weight change: -0.74 kg (-1 lb 10.1 oz) Last BM Date: 11/15/11  Intake/Output from previous day:  Intake/Output Summary (Last 24 hours) at 11/18/11 1423 Last data  filed at 11/18/11 0900  Gross per 24 hour  Intake 1269.5 ml  Output    925 ml  Net  344.5 ml     Physical Exam:  Gen:  Awake, alert in NAD. Sitting in bedside chair Cardiovascular:  Irregularly irregular, no m/r/g Respiratory: diminished LL bilaterally with mild rhonchi Gastrointestinal: abdomen soft, NT/ND, BS+ Extremities: no c/c/e   Lab Results:  Lab 11/18/11 0456 11/17/11 0009  HGB 9.0* 11.2*  HCT 27.7* 35.1*  WBC 23.5* 19.4*  PLT 154 161     Lab 11/18/11 0456 11/17/11 0009  NA 131* 134*  K 3.7 3.9  CL 98 98  CO2 21 23  GLUCOSE 148* 90  BUN 36* 26*  CREATININE 1.62* 1.75*  CALCIUM 9.0 9.3  MG -- --  PHOS -- --    Studies/Results: Dg Chest 1 View  11/17/2011  *RADIOLOGY REPORT*  Clinical Data: Short of breath.  Hypoxia.  CHEST - 1 VIEW  Comparison: 10/12/2011  Findings: Moderate cardiomegaly and ectasia of the thoracic aorta are stable.  Mild bibasilar scarring is unchanged.  No evidence of acute infiltrate or edema.  No evidence of pleural effusion.  IMPRESSION: Stable cardiomegaly and bibasilar scarring.  No acute findings.  Original Report Authenticated By: Danae Orleans, M.D.   Ct Angio Chest W/cm &/or Wo Cm  11/17/2011  *RADIOLOGY REPORT*  Clinical Data: Back pain, shortness of breath, leg pain,, question pulmonary embolism  CT ANGIOGRAPHY CHEST  Technique:  Multidetector CT imaging of the chest using the standard protocol during bolus administration of intravenous contrast. Multiplanar reconstructed images including MIPs were obtained and reviewed to evaluate the vascular anatomy.  Contrast:  80 ml Omnipaque 300 IV.  Comparison: CT chest 05/01/2011  Findings: Extensive atherosclerotic calcifications aorta and coronary arteries. Cardiac  chambers are enlarged particularly the left atrium. No aortic dissection identified. Aneurysmal dilatation of ascending thoracic aorta 4.3 x 4.0 cm image 54. No thoracic adenopathy. Visualized portion of upper abdomen unremarkable.  Dilated central pulmonary arteries question pulmonary arterial hypertension.  Pulmonary arteries patent. No evidence of pulmonary embolism. Emphysematous changes with scattered peribronchial thickening. Bibasilar atelectasis and more focal areas of opacity likely representing infiltrate. Upper lungs clear. Bones demineralized.  IMPRESSION: No evidence of pulmonary embolism. Enlarged central pulmonary arteries question pulmonary arterial hypertension. Aneurysmal dilatation of the ascending thoracic aorta with extensive atherosclerotic calcification of the aorta and coronary arteries. Changes of COPD with bibasilar atelectasis and questionable areas of infiltrate. Enlargement of cardiac chambers.  Original Report Authenticated By: Lollie Marrow, M.D.   Dg Knee Complete 4 Views Left  11/17/2011  *RADIOLOGY REPORT*  Clinical Data: Worsening knee pain.  LEFT KNEE - COMPLETE 4+ VIEW  Comparison: 10/12/2011  Findings: Severe medial compartment osteoarthritis is seen with tibia varus.  Generalized osteopenia is noted.  Small knee joint effusion also demonstrated.  No evidence of fracture or dislocation.  Peripheral vascular calcification again noted.  IMPRESSION:  1.  No acute findings. 2.  Severe medial compartment osteoarthritis and small knee joint effusion. 3.  Osteopenia and peripheral vascular calcification.  Original Report Authenticated By: Danae Orleans, M.D.   Dg Knee Complete 4 Views Right  11/17/2011  *RADIOLOGY REPORT*  Clinical Data: Worsening knee pain.  RIGHT KNEE - COMPLETE 4+ VIEW  Comparison: None.  Findings: No evidence of fracture or dislocation.  Small knee joint effusion noted.  Severe medial compartment osteoarthritis is seen with tibia varus.  Peripheral vascular calcification also noted. Osteopenia also demonstrated.  IMPRESSION:  1.  No acute findings. 2.  Severe medial compartment osteoarthritis and small knee joint effusion. 3.  Osteopenia and peripheral vascular calcification.  Original  Report Authenticated By: Danae Orleans, M.D.    Medications: Scheduled Meds:   . amLODipine  10 mg Oral Daily  . aspirin EC  325 mg Oral Daily  . budesonide-formoterol  2 puff Inhalation BID  . docusate sodium  100 mg Oral BID  . furosemide  80 mg Oral Daily  . guaiFENesin  600 mg Oral BID  . heparin  5,000 Units Subcutaneous Q8H  . hydrALAZINE  25 mg Oral TID  . senna  1 tablet Oral BID  . sodium chloride  3 mL Intravenous Q12H  . sodium chloride  3 mL Intravenous Q12H  . tiotropium  18 mcg Inhalation Daily  . DISCONTD: albuterol  2.5 mg Nebulization Q6H   Continuous Infusions:   . diltiazem (CARDIZEM) infusion 15 mg/hr (11/18/11 1127)   PRN Meds:.sodium chloride, acetaminophen, acetaminophen, albuterol, diphenhydrAMINE, levalbuterol, ondansetron (ZOFRAN) IV, ondansetron, oxyCODONE, sodium chloride Antibiotics: Anti-infectives    None       Assessment/Plan:  1. Afib with RVR: Still increased rate on diltiazem drip at 15mg /hr. His BP is borderline low so will have to monitor closely if gtt titrated further. Will ask cardiology to see and defer to them. He is not a coumadin candidate secondary to hx of GIB  2. Leukocytosis: WBC 23.5<--19.4 on 3/24. Pt is afebrile. There was question of infiltrate on CT chest done on admit. Will start pt on Levaquin (pharmacy to consult with increased renal fx). Will add urine culture though u/a is fairly unremarkable.  3. Anemia: Hgb 9.0<--11.2 on 3/24. Uncertain the cause of this significant drop but concerning as pt does have hx of GIB though  BUN not disproportionately high  (esophageal ulcer 02/2010). Will hemoccult stool. Recheck Hgb now with #1.  4. CKD/stage III: stable. Cr 1.62<--1.75. Monitor  5. Bilateral knee pain secondary to severe OA: much improved. PT recommends HHPT at this time  6. Elevated d-dimer: CT chest was negative. No complaints of lower extremity pain. Patient denied shortness of breath to the admitting physician and  was noted to be breathing comfortably. D-dimer elevation is chronic in nature (elevated 05/23/2011, 05/05/2011). Would not reorder d-dimer in the future as its clinical utility is negligible in this patient.  7. Chronic diastolic heart failure, severe mitral regurgitation, severe tricuspid regurgitation, severe pulmonary hypertension: Compensated. Continue home Lasix and hydralazine  8. Oxygen-dependent COPD: Stable. Continue oxygen, Spiriva, Symbicort. Changed Albuterol to Xopenex in setting of #1.  9. Ascending aortic aneurysm: Seen on CT angio chest. Followup as an outpatient.  10. Prophylaxis: Continue SQ heparin for now. Monitor closely with #3.  11. Facial itching: unclear etiology. No rash or suspicious lesions. ? Med effect though not sure which med this would be. Will add prn benadryl and monitor.  Cordelia Pen, NP-C [facility]  pgr [phone]        LOS: 2 days    11/18/2011, 2:23 PM

## 2011-11-18 NOTE — Progress Notes (Signed)
   CARE MANAGEMENT NOTE 11/18/2011  Patient:  [name], [name]   Account Number:  [id number]  Date Initiated:  11/18/2011  Documentation initiated by:  [facility]  Subjective/Objective Assessment:   ADMITTED W/BILAT KNEE PAIN.AFIB,RVR.     Action/Plan:   FROM HOME ALONE,BUT SISTER LIVES [address].HAS PCP,PHARMACY,TRANSP,HOME 02-[facility], RW.HAS TRAVEL 02 IN [place].HAS USED HH IN PAST.   Anticipated DC Date:  11/22/2011   Anticipated DC Plan:  HOME/SELF CARE      DC Planning Services  Patient refused services      Choice offered to / List presented to:             Status of service:  In process, will continue to follow Medicare Important Message given?   (If response is "NO", the following Medicare IM given date fields will be blank) Date Medicare IM given:   Date Additional Medicare IM given:    Discharge Disposition:    Per UR Regulation:  Reviewed for med. necessity/level of care/duration of stay  If discussed at Long Length of Stay Meetings, dates discussed:    Comments:  11/18/11 [name] Lindaman RN,BSN NCM [phone] PT-HH.PLEASANTLY DECLINE HH,SAYS HE HAS USED IT IN PAST.PROVIDED [facility] AGENCY LIST AS RESOURCE.

## 2011-11-18 NOTE — Consult Note (Signed)
HPI: 76 yo male followed by Dr Daleen Squibb for evaluation of atrial fibrillation. Last echo 9/12 showed normal LV function (EF 55-60), severe MR, mild AI, severe LAE, moderate RAE, moderate to severe TR and severe pulmonary hypertension. Patient previously treated with Dig, cardizem, metoprolol and amiodarone. However, admitted 10/12 with weakness and near syncope and noted to be in a junctional rhythm with a HR in 30s. All rate controlling meds DCed. Patient in sinus when seen by Tereso Newcomer in Oct. Patient developed increased bilateral knee pain recently (some improvement with injections) and increased to the point of inability to ambulate. Came to ER and noted to be in afib with RVR and cardiology asked to evaluate. He has chronic DOE (on Home O2 for COPD); no orthopnea, PND, pedal edema or chest pain.  Medications Prior to Admission  Medication Dose Route Frequency Provider Last Rate Last Dose  . 0.9 %  sodium chloride infusion   Intravenous Once Glynn Octave, MD 100 mL/hr at 11/17/11 0039    . 0.9 %  sodium chloride infusion  250 mL Intravenous PRN Devonne Doughty, MD      . acetaminophen (TYLENOL) tablet 650 mg  650 mg Oral Q6H PRN Devonne Doughty, MD   650 mg at 11/17/11 1653   Or  . acetaminophen (TYLENOL) suppository 650 mg  650 mg Rectal Q6H PRN Devonne Doughty, MD      . albuterol (PROVENTIL) (5 MG/ML) 0.5% nebulizer solution 2.5 mg  2.5 mg Nebulization Once Glynn Octave, MD   2.5 mg at 11/17/11 0058  . albuterol (PROVENTIL) (5 MG/ML) 0.5% nebulizer solution 2.5 mg  2.5 mg Nebulization Q2H PRN Devonne Doughty, MD      . amLODipine (NORVASC) tablet 10 mg  10 mg Oral Daily Devonne Doughty, MD   10 mg at 11/18/11 0925  . aspirin EC tablet 325 mg  325 mg Oral Daily Devonne Doughty, MD   325 mg at 11/18/11 0924  . budesonide-formoterol (SYMBICORT) 160-4.5 MCG/ACT inhaler 2 puff  2 puff Inhalation BID Devonne Doughty, MD   2 puff at 11/18/11 0930  . diltiazem (CARDIZEM) 100 mg in dextrose 5 % 100 mL infusion  5-15  mg/hr Intravenous Titrated Glynn Octave, MD 15 mL/hr at 11/18/11 1127 15 mg/hr at 11/18/11 1127  . diltiazem (CARDIZEM) injection 10 mg  10 mg Intravenous Once Glynn Octave, MD   10 mg at 11/17/11 0047  . diphenhydrAMINE (BENADRYL) capsule 25 mg  25 mg Oral Q6H PRN Standley Brooking, MD   25 mg at 11/18/11 1236  . docusate sodium (COLACE) capsule 100 mg  100 mg Oral BID Devonne Doughty, MD   100 mg at 11/18/11 0925  . furosemide (LASIX) tablet 80 mg  80 mg Oral Daily Devonne Doughty, MD   80 mg at 11/18/11 0925  . guaiFENesin (MUCINEX) 12 hr tablet 600 mg  600 mg Oral BID Devonne Doughty, MD   600 mg at 11/18/11 0926  . heparin injection 5,000 Units  5,000 Units Subcutaneous Q8H Devonne Doughty, MD   5,000 Units at 11/18/11 1331  . hydrALAZINE (APRESOLINE) tablet 25 mg  25 mg Oral TID Devonne Doughty, MD   25 mg at 11/18/11 0924  . iohexol (OMNIPAQUE) 300 MG/ML solution 80 mL  80 mL Intravenous Once PRN Medication Radiologist, MD   80 mL at 11/17/11 0157  . ipratropium (ATROVENT) nebulizer solution 0.5 mg  0.5 mg Nebulization Once Glynn Octave, MD  0.5 mg at 11/17/11 0058  . levalbuterol (XOPENEX) nebulizer solution 0.63 mg  0.63 mg Nebulization Q6H PRN Cordelia Pen, NP      . methylPREDNISolone sodium succinate (SOLU-MEDROL) 125 mg/2 mL injection 125 mg  125 mg Intravenous Once Glynn Octave, MD   125 mg at 11/17/11 0045  . morphine 4 MG/ML injection 4 mg  4 mg Intravenous Once Glynn Octave, MD   4 mg at 11/17/11 0046  . ondansetron (ZOFRAN) tablet 4 mg  4 mg Oral Q6H PRN Devonne Doughty, MD       Or  . ondansetron (ZOFRAN) injection 4 mg  4 mg Intravenous Q6H PRN Devonne Doughty, MD      . oxyCODONE (Oxy IR/ROXICODONE) immediate release tablet 5 mg  5 mg Oral Q6H PRN Devonne Doughty, MD      . senna (SENOKOT) tablet 8.6 mg  1 tablet Oral BID Devonne Doughty, MD   8.6 mg at 11/18/11 0925  . sodium chloride 0.9 % injection 3 mL  3 mL Intravenous Q12H Devonne Doughty, MD   3 mL at 11/17/11 1030  . sodium  chloride 0.9 % injection 3 mL  3 mL Intravenous Q12H Devonne Doughty, MD      . sodium chloride 0.9 % injection 3 mL  3 mL Intravenous PRN Devonne Doughty, MD      . tiotropium [place]) inhalation capsule 18 mcg  18 mcg Inhalation Daily Standley Brooking, MD   18 mcg at 11/18/11 0931  . DISCONTD: albuterol (PROVENTIL) (5 MG/ML) 0.5% nebulizer solution 2.5 mg  2.5 mg Nebulization Q6H Devonne Doughty, MD   2.5 mg at 11/18/11 0930  . DISCONTD: diltiazem (CARDIZEM) tablet 60 mg  60 mg Oral Q6H Devonne Doughty, MD   60 mg at 11/17/11 0527  . DISCONTD: ipratropium (ATROVENT) nebulizer solution 0.5 mg  0.5 mg Nebulization Q6H Devonne Doughty, MD   0.5 mg at 11/17/11 0802   Medications Prior to Admission  Medication Sig Dispense Refill  . albuterol (PROVENTIL HFA;VENTOLIN HFA) 108 (90 BASE) MCG/ACT inhaler Inhale 2 puffs into the lungs every 6 (six) hours as needed. For shortness of breath      . amLODipine (NORVASC) 10 MG tablet Take 10 mg by mouth daily.      . budesonide-formoterol (SYMBICORT) 160-4.5 MCG/ACT inhaler Inhale 2 puffs into the lungs 2 (two) times daily.      . furosemide (LASIX) 80 MG tablet Take 1 tablet (80 mg total) by mouth daily.      . hydrALAZINE (APRESOLINE) 25 MG tablet Take 25 mg by mouth 3 (three) times daily.      . pantoprazole (PROTONIX) 40 MG tablet Take 40 mg by mouth 2 (two) times daily.       Marland Kitchen tiotropium (SPIRIVA) 18 MCG inhalation capsule Place 18 mcg into inhaler and inhale daily.      Marland Kitchen albuterol (PROVENTIL) 2 MG tablet Take 2 mg by mouth 3 (three) times daily.        No Known Allergies  Past Medical History  Diagnosis Date  . Abdominal aortic aneurysm     s/p stent graft repair in 6/12  . Iliac artery aneurysm, bilateral   . Anemia   . Atrial fibrillation     no coumadin due to h/o GI bleeding. 05/2011 stopped  metoprolol, amiodarone, digoxin, diltiazem due to bradycardia   . Tibial plateau fracture     non displaced  . COPD (chronic  obstructive pulmonary disease)      emphysema and severe outflow obstruction, on home O2  . Lung mass     right lower lobe spiculated mass  . Chronic kidney disease     Stage II  . Oxygen dependent   . CHF (congestive heart failure)     Diastolic Heart failure, Hx of severe MR;  Echocardiogram 05/01/11: Mild LVH, EF 55-60%, mild AI, severe MR, severe LAE, moderate RAE, normal RVSF, moderate-severe TR, PASP 79, severe pulmonary hypertension  . Hypertension   . Osteoporosis   . Alcoholic hepatitis     Past Surgical History  Procedure Date  . Arteriovenous graft placement   . Abdominal aortic aneurysm repair   . Bladder surgery   . Neck/back surgery     History   Social History  . Marital Status: Single    Spouse Name: N/A    Number of Children: 0  . Years of Education: N/A   Occupational History  . retired    Social History Main Topics  . Smoking status: Former Smoker -- 0.5 packs/day for 20 years    Types: Cigarettes    Quit date: 08/26/2009  . Smokeless tobacco: Never Used  . Alcohol Use: No     history of ETOH abuse in the past  . Drug Use: No  . Sexually Active: Not on file   Other Topics Concern  . Not on file   Social History Narrative   Lives alone, but has a sister across the street and a brother also. Never married, no kids. Ambulatory with a cane and walker. Denies smoking, drinking, drugs at presents. Previously had visiting nurse but not now.     Family History  Problem Relation Age of Onset  . Emphysema Father   . Heart disease Mother   . Rheum arthritis Mother   . Leukemia Father     ROS:  Knee arthralgias but no fevers or chills, productive cough, hemoptysis, dysphasia, odynophagia, melena, hematochezia, dysuria, hematuria, rash, seizure activity, orthopnea, PND, pedal edema, claudication. Remaining systems are negative.  Physical Exam:   Blood pressure 114/63, pulse 99, temperature 98 F (36.7 C), temperature source Oral, resp. rate 18, height 5\' 8"  (1.727 m), weight 148 lb 5.9  oz (67.3 kg), SpO2 94.00%.  General:  Well developed/well nourished in NAD Skin warm/dry Patient not depressed Positive peripheral clubbing Back-normal HEENT-normal/normal eyelids Neck supple/normal carotid upstroke bilaterally; no bruits; + JVD; no thyromegaly chest - Diminished BS throughout CV - irregular and tachycardic/normal S1 and S2; no rubs or gallops;  2/6 systolic murmur apex Abdomen -NT/ND, no HSM, no mass, + bowel sounds, no bruit 2+ femoral pulses, no bruits Ext-no edema, chords, diminished distal pulses Neuro-grossly nonfocal  ECG afib with RVR, LVH  Results for orders placed during the hospital encounter of 11/16/11 (from the past 48 hour(s))  CBC     Status: Abnormal   Collection Time   11/17/11 12:09 AM      Component Value Range Comment   WBC 19.4 (*) 4.0 - 10.5 (K/uL)    RBC 3.68 (*) 4.22 - 5.81 (MIL/uL)    Hemoglobin 11.2 (*) 13.0 - 17.0 (g/dL)    HCT 14.7 (*) 82.9 - 52.0 (%)    MCV 95.4  78.0 - 100.0 (fL)    MCH 30.4  26.0 - 34.0 (pg)    MCHC 31.9  30.0 - 36.0 (g/dL)    RDW 56.2 (*) 13.0 - 15.5 (%)    Platelets 161  150 -  400 (K/uL)   DIFFERENTIAL     Status: Abnormal   Collection Time   11/17/11 12:09 AM      Component Value Range Comment   Neutrophils Relative 91 (*) 43 - 77 (%)    Neutro Abs 17.7 (*) 1.7 - 7.7 (K/uL)    Lymphocytes Relative 5 (*) 12 - 46 (%)    Lymphs Abs 0.9  0.7 - 4.0 (K/uL)    Monocytes Relative 4  3 - 12 (%)    Monocytes Absolute 0.8  0.1 - 1.0 (K/uL)    Eosinophils Relative 0  0 - 5 (%)    Eosinophils Absolute 0.0  0.0 - 0.7 (K/uL)    Basophils Relative 0  0 - 1 (%)    Basophils Absolute 0.0  0.0 - 0.1 (K/uL)   BASIC METABOLIC PANEL     Status: Abnormal   Collection Time   11/17/11 12:09 AM      Component Value Range Comment   Sodium 134 (*) 135 - 145 (mEq/L)    Potassium 3.9  3.5 - 5.1 (mEq/L)    Chloride 98  96 - 112 (mEq/L)    CO2 23  19 - 32 (mEq/L)    Glucose, Bld 90  70 - 99 (mg/dL)    BUN 26 (*) 6 - 23 (mg/dL)      Creatinine, Ser 0.98 (*) 0.50 - 1.35 (mg/dL)    Calcium 9.3  8.4 - 10.5 (mg/dL)    GFR calc non Af Amer 37 (*) >90 (mL/min)    GFR calc Af Amer 42 (*) >90 (mL/min)   PROTIME-INR     Status: Abnormal   Collection Time   11/17/11 12:09 AM      Component Value Range Comment   Prothrombin Time 17.0 (*) 11.6 - 15.2 (seconds)    INR 1.36  0.00 - 1.49    D-DIMER, QUANTITATIVE     Status: Abnormal   Collection Time   11/17/11 12:09 AM      Component Value Range Comment   D-Dimer, Quant 1.93 (*) 0.00 - 0.48 (ug/mL-FEU)   TROPONIN I     Status: Normal   Collection Time   11/17/11 12:10 AM      Component Value Range Comment   Troponin I <0.30  <0.30 (ng/mL)   PRO B NATRIURETIC PEPTIDE     Status: Abnormal   Collection Time   11/17/11 12:10 AM      Component Value Range Comment   Pro B Natriuretic peptide (BNP) 4938.0 (*) 0 - 125 (pg/mL)   BLOOD GAS, ARTERIAL     Status: Abnormal   Collection Time   11/17/11 12:53 AM      Component Value Range Comment   O2 Content 2.0      Delivery systems NASAL CANNULA      pH, Arterial 7.469 (*) 7.350 - 7.450     pCO2 arterial 28.7 (*) 35.0 - 45.0 (mmHg)    pO2, Arterial 68.4 (*) 80.0 - 100.0 (mmHg)    Bicarbonate 20.6  20.0 - 24.0 (mEq/L)    TCO2 18.7  0 - 100 (mmol/L)    Acid-base deficit 1.9  0.0 - 2.0 (mmol/L)    O2 Saturation 94.1      Patient temperature 98.6      Collection site RIGHT RADIAL      Drawn by [id number]      Sample type ARTERIAL DRAW      Allens test (pass/fail) PASS  PASS  URINALYSIS, ROUTINE W REFLEX MICROSCOPIC     Status: Abnormal   Collection Time   11/17/11  3:34 AM      Component Value Range Comment   Color, Urine AMBER (*) YELLOW  BIOCHEMICALS MAY BE AFFECTED BY COLOR   APPearance CLOUDY (*) CLEAR     Specific Gravity, Urine 1.045 (*) 1.005 - 1.030     pH 5.5  5.0 - 8.0     Glucose, UA NEGATIVE  NEGATIVE (mg/dL)    Hgb urine dipstick NEGATIVE  NEGATIVE     Bilirubin Urine SMALL (*) NEGATIVE     Ketones, ur TRACE (*)  NEGATIVE (mg/dL)    Protein, ur 119 (*) NEGATIVE (mg/dL)    Urobilinogen, UA 1.0  0.0 - 1.0 (mg/dL)    Nitrite NEGATIVE  NEGATIVE     Leukocytes, UA NEGATIVE  NEGATIVE    URINE MICROSCOPIC-ADD ON     Status: Abnormal   Collection Time   11/17/11  3:34 AM      Component Value Range Comment   Squamous Epithelial / LPF FEW (*) RARE     WBC, UA 3-6  <3 (WBC/hpf)    RBC / HPF 0-2  <3 (RBC/hpf)    Bacteria, UA FEW (*) RARE     Casts HYALINE CASTS (*) NEGATIVE     Urine-Other MUCOUS PRESENT     GLUCOSE, CAPILLARY     Status: Abnormal   Collection Time   11/17/11  7:43 AM      Component Value Range Comment   Glucose-Capillary 145 (*) 70 - 99 (mg/dL)   BASIC METABOLIC PANEL     Status: Abnormal   Collection Time   11/18/11  4:56 AM      Component Value Range Comment   Sodium 131 (*) 135 - 145 (mEq/L)    Potassium 3.7  3.5 - 5.1 (mEq/L)    Chloride 98  96 - 112 (mEq/L)    CO2 21  19 - 32 (mEq/L)    Glucose, Bld 148 (*) 70 - 99 (mg/dL)    BUN 36 (*) 6 - 23 (mg/dL)    Creatinine, Ser 1.47 (*) 0.50 - 1.35 (mg/dL)    Calcium 9.0  8.4 - 10.5 (mg/dL)    GFR calc non Af Amer 40 (*) >90 (mL/min)    GFR calc Af Amer 47 (*) >90 (mL/min)   CBC     Status: Abnormal   Collection Time   11/18/11  4:56 AM      Component Value Range Comment   WBC 23.5 (*) 4.0 - 10.5 (K/uL)    RBC 2.97 (*) 4.22 - 5.81 (MIL/uL)    Hemoglobin 9.0 (*) 13.0 - 17.0 (g/dL)    HCT 82.9 (*) 56.2 - 52.0 (%)    MCV 93.3  78.0 - 100.0 (fL)    MCH 30.3  26.0 - 34.0 (pg)    MCHC 32.5  30.0 - 36.0 (g/dL)    RDW 13.0 (*) 86.5 - 15.5 (%)    Platelets 154  150 - 400 (K/uL)   GLUCOSE, CAPILLARY     Status: Abnormal   Collection Time   11/18/11  8:13 AM      Component Value Range Comment   Glucose-Capillary 122 (*) 70 - 99 (mg/dL)     Dg Chest 1 View  7/84/6962  *RADIOLOGY REPORT*  Clinical Data: Short of breath.  Hypoxia.  CHEST - 1 VIEW  Comparison: 10/12/2011  Findings: Moderate cardiomegaly and ectasia of the thoracic aorta  are stable.  Mild bibasilar scarring is unchanged.  No evidence of acute infiltrate or edema.  No evidence of pleural effusion.  IMPRESSION: Stable cardiomegaly and bibasilar scarring.  No acute findings.  Original Report Authenticated By: Danae Orleans, M.D.   Ct Angio Chest W/cm &/or Wo Cm  11/17/2011  *RADIOLOGY REPORT*  Clinical Data: Back pain, shortness of breath, leg pain,, question pulmonary embolism  CT ANGIOGRAPHY CHEST  Technique:  Multidetector CT imaging of the chest using the standard protocol during bolus administration of intravenous contrast. Multiplanar reconstructed images including MIPs were obtained and reviewed to evaluate the vascular anatomy.  Contrast:  80 ml Omnipaque 300 IV.  Comparison: CT chest 05/01/2011  Findings: Extensive atherosclerotic calcifications aorta and coronary arteries. Cardiac chambers are enlarged particularly the left atrium. No aortic dissection identified. Aneurysmal dilatation of ascending thoracic aorta 4.3 x 4.0 cm image 54. No thoracic adenopathy. Visualized portion of upper abdomen unremarkable. Dilated central pulmonary arteries question pulmonary arterial hypertension.  Pulmonary arteries patent. No evidence of pulmonary embolism. Emphysematous changes with scattered peribronchial thickening. Bibasilar atelectasis and more focal areas of opacity likely representing infiltrate. Upper lungs clear. Bones demineralized.  IMPRESSION: No evidence of pulmonary embolism. Enlarged central pulmonary arteries question pulmonary arterial hypertension. Aneurysmal dilatation of the ascending thoracic aorta with extensive atherosclerotic calcification of the aorta and coronary arteries. Changes of COPD with bibasilar atelectasis and questionable areas of infiltrate. Enlargement of cardiac chambers.  Original Report Authenticated By: Lollie Marrow, M.D.   Dg Knee Complete 4 Views Left  11/17/2011  *RADIOLOGY REPORT*  Clinical Data: Worsening knee pain.  LEFT KNEE -  COMPLETE 4+ VIEW  Comparison: 10/12/2011  Findings: Severe medial compartment osteoarthritis is seen with tibia varus.  Generalized osteopenia is noted.  Small knee joint effusion also demonstrated.  No evidence of fracture or dislocation.  Peripheral vascular calcification again noted.  IMPRESSION:  1.  No acute findings. 2.  Severe medial compartment osteoarthritis and small knee joint effusion. 3.  Osteopenia and peripheral vascular calcification.  Original Report Authenticated By: Danae Orleans, M.D.   Dg Knee Complete 4 Views Right  11/17/2011  *RADIOLOGY REPORT*  Clinical Data: Worsening knee pain.  RIGHT KNEE - COMPLETE 4+ VIEW  Comparison: None.  Findings: No evidence of fracture or dislocation.  Small knee joint effusion noted.  Severe medial compartment osteoarthritis is seen with tibia varus.  Peripheral vascular calcification also noted. Osteopenia also demonstrated.  IMPRESSION:  1.  No acute findings. 2.  Severe medial compartment osteoarthritis and small knee joint effusion. 3.  Osteopenia and peripheral vascular calcification.  Original Report Authenticated By: Danae Orleans, M.D.    Assessment/Plan 1) atrial fibrillation - HR increased; agree with cardizem and titrate to keep HR < 100. Follow on telemetry closely as history of bradycardia on multiple AV nodal blocking agents in the past. Not a coumadin candidate given H/O recurrent GI blood loss. I do not think we need to pursue trying to maintain sinus as I think he would be very unlikely to hold long term given valvular heart disease/biatrial enlargement and severe COPD. DC amlodipine as cardizem has been added. 2) Knee osteoarthritis - management per primary service 3) Severe MR - not a candidate for surgical intervention. 4) Home O2 dependent COPD 5) chronic renal insuff - follow renal function  Olga Millers MD 11/18/2011, 2:19 PM

## 2011-11-18 NOTE — Progress Notes (Signed)
Patient seen and independently evaluated and examined. Data reviewed. I agree with exam, assessment and plan.  Cannot exclude infectious infiltrate on CT, although somewhat guarded. Agreed with empiric antibiotic therapy. I would consider him to be required at this point.

## 2011-11-19 LAB — BASIC METABOLIC PANEL
Chloride: 99 mEq/L (ref 96–112)
GFR calc Af Amer: 48 mL/min — ABNORMAL LOW (ref >90)
GFR calc non Af Amer: 41 mL/min — ABNORMAL LOW (ref >90)
Glucose, Bld: 104 mg/dL — ABNORMAL HIGH (ref 70–99)
Potassium: 3.8 mEq/L (ref 3.5–5.1)
Sodium: 133 mEq/L — ABNORMAL LOW (ref 135–145)

## 2011-11-19 LAB — CBC
Hemoglobin: 9.1 g/dL — ABNORMAL LOW (ref 13.0–17.0)
MCHC: 31.6 g/dL (ref 30.0–36.0)
WBC: 16.4 10*3/uL — ABNORMAL HIGH (ref 4.0–10.5)

## 2011-11-19 MED ORDER — DILTIAZEM LOAD VIA INFUSION
10.0000 mg | Freq: Once | INTRAVENOUS | Status: AC
  Administered 2011-11-19: 10 mg via INTRAVENOUS
  Filled 2011-11-19: qty 10

## 2011-11-19 MED ORDER — DEXTROSE 5 % IV SOLN
5.0000 mg/h | INTRAVENOUS | Status: DC
  Administered 2011-11-19 – 2011-11-20 (×3): 20 mg/h via INTRAVENOUS
  Administered 2011-11-20: 10 mg/h via INTRAVENOUS
  Administered 2011-11-20 (×2): 20 mg/h via INTRAVENOUS
  Administered 2011-11-21: 5 mg/h via INTRAVENOUS
  Filled 2011-11-19 (×11): qty 100

## 2011-11-19 NOTE — Progress Notes (Signed)
PROGRESS NOTE  [name] ZOX:[id number] DOB: July 19, 1936 DOA: 11/16/2011 PCP: Beverley Fiedler, MD, MD Pulmonologist: Marcelyn Bruins, M.D. Vascular surgeon: Fabienne Bruns, M.D. Cardiologist: Valera Castle, M.D.  Brief narrative: 76 year old man presented to the emergency department with bilateral knee pain, chronic. Found to have atrial fibrillation with rapid ventricular response and referred for admission. Subsequently noted to have pneumonia.  Chart review:  05/30/2011-05/02/2011 hospitalization: Bradycardia secondary to medications (metoprolol, digoxin, diltiazem, amiodarone), proximal atrial fibrillation (not a Coumadin candidate secondary to history of GI bleed). Discharge medications included now rate control agents.  04/2011 hospitalization: Acute COPD exacerbation, atrial fibrillation rate controlled  Past medical history: Paroxysmal atrial fibrillation (not a warfarin candidate secondary to history of GI bleed), bradycardia requiring the discontinuation of rate control agents, oxygen-dependent COPD, chronic kidney disease stage III, anemia, alcohol abuse, smoker, diastolic congestive heart failure, or mitral rotation, severe tricuspid regurgitation, pulmonary hypertension, alcoholic hepatitis, GI bleed  Consultants:  Cardiology  Physical therapy: Home health physical therapy  Procedures:  None  Interim History: Interval documentation reviewed. Appreciate cardiology recommendations.  Subjective: Overall feels okay.  Objective: Filed Vitals:   11/19/11 0755 11/19/11 0830 11/19/11 1034 11/19/11 1530  BP: 124/80 122/71  112/71  Pulse: 132 98  115  Temp:    97.5 F (36.4 C)  TempSrc:    Oral  Resp:    18  Height:      Weight:      SpO2:   93% 99%    Intake/Output Summary (Last 24 hours) at 11/19/11 1635 Last data filed at 11/19/11 1600  Gross per 24 hour  Intake    998 ml  Output   2500 ml  Net  -1502 ml    Exam:   General:  Appears calm and  comfortable.   Cardiovascular: Tachycardic, regular rhythm. No murmur, rub, gallop. No lower extremity edema.  Telemetry: Atrial fibrillation, ventricular rate 90-120s.  Respiratory: Decreased breath sounds bilaterally but no wheezes, rales, rhonchi. Decreased work of breathing.  Psychiatric: Grossly normal mood and affect. Speech fluent and appropriate.  Data Reviewed: Basic Metabolic Panel:  Lab 11/19/11 8119 11/18/11 0456 11/17/11 0009  NA 133* 131* 134*  K 3.8 3.7 --  CL 99 98 98  CO2 24 21 23   GLUCOSE 104* 148* 90  BUN 38* 36* 26*  CREATININE 1.59* 1.62* 1.75*  CALCIUM 8.6 9.0 9.3  MG -- -- --  PHOS -- -- --   CBC:  Lab 11/19/11 0522 11/18/11 1530 11/18/11 0456 11/17/11 0009  WBC 16.4* 24.6* 23.5* 19.4*  NEUTROABS -- -- -- 17.7*  HGB 9.1* 9.4* 9.0* 11.2*  HCT 28.8* 28.6* 27.7* 35.1*  MCV 95.0 93.2 93.3 95.4  PLT 177 179 154 161   Cardiac Enzymes:  Lab 11/17/11 0010  CKTOTAL --  CKMB --  CKMBINDEX --  TROPONINI <0.30   CBG:  Lab 11/19/11 0758 11/18/11 0813 11/17/11 0743  GLUCAP 93 122* 145*   Studies: Dg Chest 1 View  11/17/2011  *RADIOLOGY REPORT*  Clinical Data: Short of breath.  Hypoxia.  CHEST - 1 VIEW  Comparison: 10/12/2011  Findings: Moderate cardiomegaly and ectasia of the thoracic aorta are stable.  Mild bibasilar scarring is unchanged.  No evidence of acute infiltrate or edema.  No evidence of pleural effusion.  IMPRESSION: Stable cardiomegaly and bibasilar scarring.  No acute findings.  Original Report Authenticated By: Danae Orleans, M.D.   Ct Angio Chest W/cm &/or Wo Cm  11/17/2011  *RADIOLOGY REPORT*  Clinical Data: Back pain, shortness  of breath, leg pain,, question pulmonary embolism  CT ANGIOGRAPHY CHEST  Technique:  Multidetector CT imaging of the chest using the standard protocol during bolus administration of intravenous contrast. Multiplanar reconstructed images including MIPs were obtained and reviewed to evaluate the vascular anatomy.   Contrast:  80 ml Omnipaque 300 IV.  Comparison: CT chest 05/01/2011  Findings: Extensive atherosclerotic calcifications aorta and coronary arteries. Cardiac chambers are enlarged particularly the left atrium. No aortic dissection identified. Aneurysmal dilatation of ascending thoracic aorta 4.3 x 4.0 cm image 54. No thoracic adenopathy. Visualized portion of upper abdomen unremarkable. Dilated central pulmonary arteries question pulmonary arterial hypertension.  Pulmonary arteries patent. No evidence of pulmonary embolism. Emphysematous changes with scattered peribronchial thickening. Bibasilar atelectasis and more focal areas of opacity likely representing infiltrate. Upper lungs clear. Bones demineralized.  IMPRESSION: No evidence of pulmonary embolism. Enlarged central pulmonary arteries question pulmonary arterial hypertension. Aneurysmal dilatation of the ascending thoracic aorta with extensive atherosclerotic calcification of the aorta and coronary arteries. Changes of COPD with bibasilar atelectasis and questionable areas of infiltrate. Enlargement of cardiac chambers.  Original Report Authenticated By: Lollie Marrow, M.D.   Dg Knee Complete 4 Views Left  11/17/2011  *RADIOLOGY REPORT*  Clinical Data: Worsening knee pain.  LEFT KNEE - COMPLETE 4+ VIEW  Comparison: 10/12/2011  Findings: Severe medial compartment osteoarthritis is seen with tibia varus.  Generalized osteopenia is noted.  Small knee joint effusion also demonstrated.  No evidence of fracture or dislocation.  Peripheral vascular calcification again noted.  IMPRESSION:  1.  No acute findings. 2.  Severe medial compartment osteoarthritis and small knee joint effusion. 3.  Osteopenia and peripheral vascular calcification.  Original Report Authenticated By: Danae Orleans, M.D.   Dg Knee Complete 4 Views Right  11/17/2011  *RADIOLOGY REPORT*  Clinical Data: Worsening knee pain.  RIGHT KNEE - COMPLETE 4+ VIEW  Comparison: None.  Findings: No  evidence of fracture or dislocation.  Small knee joint effusion noted.  Severe medial compartment osteoarthritis is seen with tibia varus.  Peripheral vascular calcification also noted. Osteopenia also demonstrated.  IMPRESSION:  1.  No acute findings. 2.  Severe medial compartment osteoarthritis and small knee joint effusion. 3.  Osteopenia and peripheral vascular calcification.  Original Report Authenticated By: Danae Orleans, M.D.   Scheduled Meds:    . aspirin EC  325 mg Oral Daily  . budesonide-formoterol  2 puff Inhalation BID  . diltiazem  10 mg Intravenous Once  . docusate sodium  100 mg Oral BID  . furosemide  80 mg Oral Daily  . guaiFENesin  600 mg Oral BID  . heparin  5,000 Units Subcutaneous Q8H  . hydrALAZINE  25 mg Oral TID  . levofloxacin (LEVAQUIN) IV  750 mg Intravenous Q48H  . senna  1 tablet Oral BID  . sodium chloride  3 mL Intravenous Q12H  . sodium chloride  3 mL Intravenous Q12H  . tiotropium  18 mcg Inhalation Daily   Continuous Infusions:    . diltiazem (CARDIZEM) infusion 20 mg/hr (11/19/11 0807)  . DISCONTD: diltiazem (CARDIZEM) infusion 15 mg/hr (11/19/11 0317)    Assessment/Plan: 1. Atrial fibrillation with rapid ventricular response: Diltiazem infusion increased to to 20 mg/hour. Not a warfarin candidate secondary to GI bleed. TSH within normal limits 05/31/2011. Appreciate cardiology recommendations. 2. Pneumonia: Levaquin. 3. Normocytic anemia: Stable. 4. Bilateral knee pain: Stable. Secondary to osteoarthritis. Physical therapy consultation. Followup with orthopedics as an outpatient. 5. Elevated d-dimer: CT chest was negative. No  complaints of lower extremity pain. Patient denied shortness of breath to the admitting physician and was noted to be breathing comfortably. D-dimer elevation is chronic in nature (elevated 05/23/2011, 05/05/2011). Would not reorder d-dimer in the future as its clinical utility is negligible in this patient. 6. Chronic  diastolic heart failure, severe mitral regurgitation, severe tricuspid regurgitation, severe pulmonary hypertension: Compensated. Continue home Lasix and hydralazine. 7. Oxygen-dependent COPD: Stable. Continue oxygen, albuterol, Spiriva, Symbicort. 8. Chronic kidney disease stage III: Appears stable. 9. Ascending aortic aneurysm: Followup as an outpatient.   Code Status: Full code Family Communication: None at bedside Disposition Plan: Home when improved.   Brendia Sacks, MD  [facility] Pager [phone] 11/19/2011, 4:35 PM    LOS: 3 days

## 2011-11-19 NOTE — Progress Notes (Signed)
Physical Therapy Treatment Patient Details Name: JOMARI BARTNIK MRN: [id number] DOB: 18-Sep-1935 Today's Date: 11/19/2011  PT Assessment/Plan  PT - Assessment/Plan Comments on Treatment Session: Pt reports knee pain increases with ambulation, and he has recently been receiving injections (thinking about TKRs).  Pt required standing rest breaks during ambulation 2* to SOB on 3L oxygen. PT Plan: Discharge plan remains appropriate;Frequency remains appropriate Follow Up Recommendations: Home health PT Equipment Recommended: None recommended by PT PT Goals  Acute Rehab PT Goals PT Goal: Ambulate - Progress: Progressing toward goal  PT Treatment Precautions/Restrictions  Precautions Precautions: Fall Required Braces or Orthoses: No Restrictions Weight Bearing Restrictions: No Mobility (including Balance) Bed Mobility Bed Mobility: Yes Supine to Sit: 6: Modified independent (Device/Increase time) Transfers Sit to Stand: 5: Supervision;With upper extremity assist;From bed Sit to Stand Details (indicate cue type and reason): pt impulsive attempting to stand before having straight cane near him Stand to Sit: 5: Supervision;With upper extremity assist;To chair/3-in-1 Stand to Sit Details: verbal cue for hand placement Ambulation/Gait Ambulation/Gait: Yes Ambulation/Gait Assistance: 4: Min assist Ambulation/Gait Assistance Details (indicate cue type and reason): min/guard, verbal cues for pursed lip breathing, pt with bilateral knee varus, L knee clicking occasionally with gait, SaO2 94% on 3L, RN informed PT HR 140 Ambulation Distance (Feet): 160 Feet Assistive device: Straight cane Gait Pattern: Step-through pattern;Decreased stride length Gait velocity: decreased    Exercise    End of Session PT - End of Session Equipment Utilized During Treatment: Gait belt Activity Tolerance: Patient limited by fatigue Patient left: in chair;with call bell in reach General Behavior During  Session: [facility] for tasks performed Cognition: [facility] for tasks performed  Anivea Velasques,KATHrine E 11/19/2011, 1:16 PM Pager: [phone]

## 2011-11-19 NOTE — Progress Notes (Signed)
    Subjective:  No chest pain, dyspnea, palpitations  Objective:  Vital Signs in the last 24 hours: Temp:  [97.7 F (36.5 C)-97.8 F (36.6 C)] 97.8 F (36.6 C) (03/26 0541) Pulse Rate:  [60-107] 107  (03/26 0541) Resp:  [18] 18  (03/26 0541) BP: (112-124)/(74-77) 112/77 mmHg (03/26 0541) SpO2:  [94 %-96 %] 96 % (03/26 0541) Weight:  [69 kg (152 lb 1.9 oz)] 69 kg (152 lb 1.9 oz) (03/26 0500)  Intake/Output from previous day: 03/25 0701 - 03/26 0700 In: 1788 [P.O.:840; I.V.:798; IV Piggyback:150] Out: 1300 [Urine:1300]  Physical Exam: Pt is alert and oriented, NAD HEENT: normal Neck: JVP - normal, carotids 2+= without bruits Lungs: dimished breath sounds bilaterally CV: tachy irregular, 2/6 apical systolic murmur Abd: soft, NT, Positive BS, no hepatomegaly Ext: no C/C/E, distal pulses intact and equal Skin: warm/dry no rash   Lab Results:  Basename 11/19/11 0522 11/18/11 1530  WBC 16.4* 24.6*  HGB 9.1* 9.4*  PLT 177 179    Basename 11/19/11 0522 11/18/11 0456  NA 133* 131*  K 3.8 3.7  CL 99 98  CO2 24 21  GLUCOSE 104* 148*  BUN 38* 36*  CREATININE 1.59* 1.62*    Basename 11/17/11 0010  TROPONINI <0.30   Tele:  afib heart rate 100-120  Assessment/Plan:  1. AFib with RVR 2. Severe MR 3. Severe COPD 4. Hx GI Bleed  Recommend increase cardizem to 20 mg/hour with an additional bolus this am. Will try to keep on single drug therapy considering his hx of junctional bradycardia. May have to add a beta-blocker tomorrow if he does not respond well. Not a candidate for anticoagulation as per note of Dr Jens Som. Will follow, thx.  Tonny Bollman, M.D. 11/19/2011, 7:35 AM

## 2011-11-20 LAB — CBC
Platelets: 213 10*3/uL (ref 150–400)
RBC: 3.15 MIL/uL — ABNORMAL LOW (ref 4.22–5.81)
WBC: 8.5 10*3/uL (ref 4.0–10.5)

## 2011-11-20 LAB — GLUCOSE, CAPILLARY: Glucose-Capillary: 82 mg/dL (ref 70–99)

## 2011-11-20 LAB — BASIC METABOLIC PANEL
CO2: 27 mEq/L (ref 19–32)
Chloride: 97 mEq/L (ref 96–112)
Sodium: 133 mEq/L — ABNORMAL LOW (ref 135–145)

## 2011-11-20 MED ORDER — SODIUM CHLORIDE 0.9 % IV BOLUS (SEPSIS)
250.0000 mL | Freq: Once | INTRAVENOUS | Status: AC
  Administered 2011-11-20: 250 mL via INTRAVENOUS

## 2011-11-20 MED ORDER — METOPROLOL TARTRATE 50 MG PO TABS
50.0000 mg | ORAL_TABLET | Freq: Two times a day (BID) | ORAL | Status: DC
  Administered 2011-11-21 – 2011-11-22 (×3): 50 mg via ORAL
  Filled 2011-11-20 (×4): qty 1

## 2011-11-20 MED ORDER — METOPROLOL TARTRATE 50 MG PO TABS
50.0000 mg | ORAL_TABLET | Freq: Two times a day (BID) | ORAL | Status: DC
  Administered 2011-11-20: 50 mg via ORAL
  Filled 2011-11-20 (×4): qty 1

## 2011-11-20 MED ORDER — METOPROLOL TARTRATE 50 MG PO TABS
50.0000 mg | ORAL_TABLET | Freq: Two times a day (BID) | ORAL | Status: DC
  Filled 2011-11-20 (×3): qty 1

## 2011-11-20 NOTE — Progress Notes (Signed)
2215 BP 80/55. Notified on call physician; Ordered [place] bolus, hold Lopressor/Hydralazine, reduce Caradizem drip from 10-92ml/hr. Dr. Also aware of 2.05sec. Pause. I will continue to monitor pt for symptoms of change in mentation throughout shift.

## 2011-11-20 NOTE — Progress Notes (Signed)
    Subjective:  Chronic cough with sputum production. No other complaints. No chest pain or palps.  Objective:  Vital Signs in the last 24 hours: Temp:  [97.5 F (36.4 C)-98.2 F (36.8 C)] 97.5 F (36.4 C) (03/27 0607) Pulse Rate:  [98-115] 111  (03/27 0607) Resp:  [18] 18  (03/27 0607) BP: (107-130)/(68-77) 107/68 mmHg (03/27 0607) SpO2:  [93 %-99 %] 97 % (03/27 0607) Weight:  [66.089 kg (145 lb 11.2 oz)] 66.089 kg (145 lb 11.2 oz) (03/27 0607)  Intake/Output from previous day: 03/26 0701 - 03/27 0700 In: 700 [P.O.:360; I.V.:340] Out: 2476 [Urine:2475; Stool:1]  Physical Exam: Pt is alert and oriented, NAD HEENT: normal Neck: JVP - normal Lungs: CTA bilaterally CV: irregularly irregular Abd: soft, NT, Positive BS, no hepatomegaly Ext: no C/C/E Skin: warm/dry no rash  Lab Results:  Basename 11/20/11 0507 11/19/11 0522  WBC 8.5 16.4*  HGB 9.4* 9.1*  PLT 213 177    Basename 11/20/11 0507 11/19/11 0522  NA 133* 133*  K 3.9 3.8  CL 97 99  CO2 27 24  GLUCOSE 92 104*  BUN 37* 38*  CREATININE 1.63* 1.59*   No results found for this basename: TROPONINI:2,CK,MB:2 in the last 72 hours  Tele: atrial fib heart rate approximately 110 bpm  Assessment/Plan:  1. AFib with RVR - rate remains suboptimally controlled on IV diltiazem 2. Severe MR - nonsurgical candidate 3. Severe COPD - home O2 dependent 4. Hx GI Bleed - noncandidate for coumadin  Continue IV diltiazem. Will add metoprolol 50 mg BID and plan transition to PO dilt tomorrow if heart rate better. Will have to tolerate heart rate a little higher than normal because of risk of bradyarrhythmia in his recent history.  Tonny Bollman, M.D. 11/20/2011, 8:15 AM

## 2011-11-20 NOTE — Progress Notes (Signed)
Subjective: No complaints.  Objective: Vital signs in last 24 hours: Temp:  [97.4 F (36.3 C)-98.2 F (36.8 C)] 97.4 F (36.3 C) (03/27 1037) Pulse Rate:  [82-115] 82  (03/27 1254) Resp:  [18] 18  (03/27 1037) BP: (107-130)/(67-77) 130/68 mmHg (03/27 1254) SpO2:  [95 %-99 %] 98 % (03/27 1037) Weight:  [66.089 kg (145 lb 11.2 oz)] 66.089 kg (145 lb 11.2 oz) (03/27 0607) Weight change: -2.911 kg (-6 lb 6.7 oz) Last BM Date: 11/18/11  Intake/Output from previous day: 03/26 0701 - 03/27 0700 In: 700 [P.O.:360; I.V.:340] Out: 2476 [Urine:2475; Stool:1] Total I/O In: 240 [P.O.:240] Out: 375 [Urine:375]   Physical Exam: General: Alert, awake, oriented x3, in no acute distress. HEENT: No bruits, no goiter. Heart: Irregular, not tachycardic. Lungs: Clear to auscultation bilaterally. Abdomen: Soft, nontender, nondistended, positive bowel sounds. Extremities: No clubbing cyanosis or edema with positive pedal pulses. Neuro: Grossly intact, nonfocal.    Lab Results: Basic Metabolic Panel:  Basename 11/20/11 0507 11/19/11 0522  NA 133* 133*  K 3.9 3.8  CL 97 99  CO2 27 24  GLUCOSE 92 104*  BUN 37* 38*  CREATININE 1.63* 1.59*  CALCIUM 9.0 8.6  MG -- --  PHOS -- --   CBC:  Basename 11/20/11 0507 11/19/11 0522  WBC 8.5 16.4*  NEUTROABS -- --  HGB 9.4* 9.1*  HCT 29.6* 28.8*  MCV 94.0 95.0  PLT 213 177   CBG:  Basename 11/20/11 0744 11/19/11 0758 11/18/11 0813  GLUCAP 82 93 122*   Urine Drug Screen: Drugs of Abuse     Component Value Date/Time   LABOPIA NEGATIVE 05/01/2011 1014   COCAINSCRNUR NEGATIVE 05/01/2011 1014   LABBENZ NEGATIVE 05/01/2011 1014   AMPHETMU NEGATIVE 05/01/2011 1014     Recent Results (from the past 240 hour(s))  URINE CULTURE     Status: Normal (Preliminary result)   Collection Time   11/18/11  2:39 PM      Component Value Range Status Comment   Specimen Description URINE, CLEAN CATCH   Final    Special Requests none   Final    Culture   Setup Time [id number]   Final    Colony Count >=100,000 COLONIES/ML   Final    Culture ESCHERICHIA COLI   Final    Report Status PENDING   Incomplete     Studies/Results: No results found.  Medications: Scheduled Meds:   . aspirin EC  325 mg Oral Daily  . budesonide-formoterol  2 puff Inhalation BID  . docusate sodium  100 mg Oral BID  . furosemide  80 mg Oral Daily  . guaiFENesin  600 mg Oral BID  . heparin  5,000 Units Subcutaneous Q8H  . hydrALAZINE  25 mg Oral TID  . levofloxacin (LEVAQUIN) IV  750 mg Intravenous Q48H  . metoprolol tartrate  50 mg Oral BID  . senna  1 tablet Oral BID  . sodium chloride  3 mL Intravenous Q12H  . sodium chloride  3 mL Intravenous Q12H  . tiotropium  18 mcg Inhalation Daily   Continuous Infusions:   . diltiazem (CARDIZEM) infusion 20 mg/hr (11/20/11 1124)   PRN Meds:.sodium chloride, acetaminophen, acetaminophen, albuterol, diphenhydrAMINE, levalbuterol, ondansetron (ZOFRAN) IV, ondansetron, oxyCODONE, sodium chloride  Assessment/Plan:  Principal Problem:  *Atrial fibrillation with RVR Active Problems:  Severe mitral regurgitation  Chronic diastolic heart failure  Chronic kidney disease  COPD (chronic obstructive pulmonary disease)  Leukocytosis   #1 AFib with RVR: Cards has increased diltiazem drip  and added PO lopressor. Rate has already improved to the 80s-90s. Hope to transition off drip in am. He has a h/o of bradycardia-induced syncope last year requiring he be taken off all rate-controlling meds.  #2 ?PNA: Maybe some infiltrates on CT chest. Afebrile, leukocytosis resolved. I think leukocytosis most likely related to steroid injection received at ortho office. Will DC levaquin.  #3 Bilateral Knee pain: has osteoarthritis. To followup with ortho outpatient.  Rest of chronic issues are stable thus far.   LOS: 4 days   [facility] Pager: [phone] 11/20/2011, 2:32 PM

## 2011-11-20 NOTE — Progress Notes (Signed)
Called by Pam RN regarding SBP 80-90s. Hr maintaining in the 80s. Will titrate cardizem drip from 20-10mg /hr and place hold parameters on metoprolol and hydralazine. Will continue to follow.

## 2011-11-21 LAB — BASIC METABOLIC PANEL
BUN: 32 mg/dL — ABNORMAL HIGH (ref 6–23)
Chloride: 97 mEq/L (ref 96–112)
GFR calc Af Amer: 48 mL/min — ABNORMAL LOW (ref >90)
Glucose, Bld: 79 mg/dL (ref 70–99)
Potassium: 4 mEq/L (ref 3.5–5.1)

## 2011-11-21 LAB — CBC
HCT: 31 % — ABNORMAL LOW (ref 39.0–52.0)
Hemoglobin: 9.8 g/dL — ABNORMAL LOW (ref 13.0–17.0)
RBC: 3.27 MIL/uL — ABNORMAL LOW (ref 4.22–5.81)
WBC: 9.8 10*3/uL (ref 4.0–10.5)

## 2011-11-21 LAB — URINE CULTURE
Colony Count: >=100000
Culture  Setup Time: 201303260219

## 2011-11-21 LAB — OCCULT BLOOD X 1 CARD TO LAB, STOOL: Fecal Occult Bld: NEGATIVE

## 2011-11-21 MED ORDER — CEPHALEXIN 500 MG PO CAPS
500.0000 mg | ORAL_CAPSULE | Freq: Two times a day (BID) | ORAL | Status: DC
  Administered 2011-11-21 – 2011-11-22 (×3): 500 mg via ORAL
  Filled 2011-11-21 (×4): qty 1

## 2011-11-21 MED ORDER — DILTIAZEM HCL ER COATED BEADS 180 MG PO CP24
180.0000 mg | ORAL_CAPSULE | Freq: Every day | ORAL | Status: DC
  Administered 2011-11-21 – 2011-11-22 (×2): 180 mg via ORAL
  Filled 2011-11-21 (×2): qty 1

## 2011-11-21 NOTE — Progress Notes (Signed)
Pt had 18 beats of v-tach. BP is up from 80/55 to 118/59. Dr. Truitt Merle of both incidences; no new orders given.

## 2011-11-21 NOTE — Progress Notes (Signed)
Subjective: No complaints.  Objective: Vital signs in last 24 hours: Temp:  [97.6 F (36.4 C)-97.8 F (36.6 C)] 97.6 F (36.4 C) (03/28 0530) Pulse Rate:  [80-107] 107  (03/28 0530) Resp:  [16-18] 18  (03/28 0530) BP: (80-130)/(55-75) 111/75 mmHg (03/28 0530) SpO2:  [96 %-98 %] 98 % (03/28 1036) Weight:  [66.5 kg (146 lb 9.7 oz)] 66.5 kg (146 lb 9.7 oz) (03/28 0530) Weight change: 0.411 kg (14.5 oz) Last BM Date: 11/20/11  Intake/Output from previous day: 03/27 0701 - 03/28 0700 In: 1064.5 [P.O.:480; I.V.:334.5; IV Piggyback:250] Out: 1275 [Urine:1275] Total I/O In: 422 [P.O.:360; I.V.:62] Out: 300 [Urine:300]   Physical Exam: General: Alert, awake, oriented x3, in no acute distress. HEENT: No bruits, no goiter. Heart: Irregular, not tachycardic. Lungs: Clear to auscultation bilaterally. Abdomen: Soft, nontender, nondistended, positive bowel sounds. Extremities: No clubbing cyanosis or edema with positive pedal pulses. Neuro: Grossly intact, nonfocal.    Lab Results: Basic Metabolic Panel:  Basename 11/21/11 0450 11/20/11 0507  NA 134* 133*  K 4.0 3.9  CL 97 97  CO2 28 27  GLUCOSE 79 92  BUN 32* 37*  CREATININE 1.58* 1.63*  CALCIUM 8.9 9.0  MG -- --  PHOS -- --   CBC:  Basename 11/21/11 0450 11/20/11 0507  WBC 9.8 8.5  NEUTROABS -- --  HGB 9.8* 9.4*  HCT 31.0* 29.6*  MCV 94.8 94.0  PLT 252 213   CBG:  Basename 11/21/11 0812 11/20/11 0744 11/19/11 0758  GLUCAP 108* 82 93   Urine Drug Screen: Drugs of Abuse     Component Value Date/Time   LABOPIA NEGATIVE 05/01/2011 1014   COCAINSCRNUR NEGATIVE 05/01/2011 1014   LABBENZ NEGATIVE 05/01/2011 1014   AMPHETMU NEGATIVE 05/01/2011 1014     Recent Results (from the past 240 hour(s))  URINE CULTURE     Status: Normal   Collection Time   11/18/11  2:39 PM      Component Value Range Status Comment   Specimen Description URINE, CLEAN CATCH   Final    Special Requests none   Final    Culture  Setup Time  [id number]   Final    Colony Count >=100,000 COLONIES/ML   Final    Culture ESCHERICHIA COLI   Final    Report Status 11/21/2011 FINAL   Final    Organism ID, Bacteria ESCHERICHIA COLI   Final     Studies/Results: No results found.  Medications: Scheduled Meds:    . aspirin EC  325 mg Oral Daily  . budesonide-formoterol  2 puff Inhalation BID  . diltiazem  180 mg Oral Daily  . docusate sodium  100 mg Oral BID  . furosemide  80 mg Oral Daily  . guaiFENesin  600 mg Oral BID  . heparin  5,000 Units Subcutaneous Q8H  . metoprolol tartrate  50 mg Oral BID  . senna  1 tablet Oral BID  . sodium chloride  250 mL Intravenous Once  . sodium chloride  3 mL Intravenous Q12H  . sodium chloride  3 mL Intravenous Q12H  . tiotropium  18 mcg Inhalation Daily  . DISCONTD: hydrALAZINE  25 mg Oral TID  . DISCONTD: levofloxacin (LEVAQUIN) IV  750 mg Intravenous Q48H  . DISCONTD: metoprolol tartrate  50 mg Oral BID  . DISCONTD: metoprolol tartrate  50 mg Oral BID   Continuous Infusions:    . DISCONTD: diltiazem (CARDIZEM) infusion 5 mg/hr (11/21/11 0242)   PRN Meds:.sodium chloride, acetaminophen, acetaminophen, albuterol, diphenhydrAMINE,  levalbuterol, ondansetron (ZOFRAN) IV, ondansetron, oxyCODONE, sodium chloride  Assessment/Plan:  Principal Problem:  *Atrial fibrillation with RVR Active Problems:  Severe mitral regurgitation  Chronic diastolic heart failure  Chronic kidney disease  COPD (chronic obstructive pulmonary disease)  Leukocytosis  NSVT  #1 AFib with RVR: Rate overall improved. Had some hypotension. Cardizem drip has been turned off and transitioned to PO. Also on metoprolol, especially with some NSVT overnight.  #2 ?PNA: Maybe some infiltrates on CT chest. Afebrile, leukocytosis resolved. I think leukocytosis most likely related to steroid injection received at ortho office. Will DC levaquin.  #3 Bilateral Knee pain: has osteoarthritis. To followup with ortho  outpatient.  #4 E Coli UTI: Keflex BID for 5 days.  #5 Dispo: likely discharge home in am if HR stabilized off cardizem drip.  Rest of chronic issues are stable thus far.   LOS: 5 days   [facility] Pager: [phone] 11/21/2011, 12:35 PM

## 2011-11-21 NOTE — Progress Notes (Signed)
Physical Therapy Treatment Patient Details Name: LORAIN KEAST MRN: [id number] DOB: 11/10/1935 Today's Date: 11/21/2011  PT Assessment/Plan  PT - Assessment/Plan Comments on Treatment Session: Pt continues to report bil knee pain with ambulation. Possible D/C tomorrow PT Plan: Discharge plan remains appropriate Follow Up Recommendations: Home health PT Equipment Recommended: None recommended by PT PT Goals  Acute Rehab PT Goals PT Goal: Ambulate - Progress: Progressing toward goal  PT Treatment Precautions/Restrictions  Precautions Precautions: Fall Required Braces or Orthoses: No Restrictions Weight Bearing Restrictions: No Mobility (including Balance) Bed Mobility Bed Mobility: No Transfers Transfers: Yes Sit to Stand: 5: Supervision Stand to Sit: 5: Supervision Ambulation/Gait Ambulation/Gait: Yes Ambulation/Gait Assistance Details (indicate cue type and reason): Min-guard assist. VCs safety. 3 standing rest breaks.  Ambulation Distance (Feet): 225 Feet Assistive device: Straight cane Gait Pattern: Decreased step length - left;Decreased step length - right;Decreased stride length;Step-through pattern    Exercise    End of Session PT - End of Session Equipment Utilized During Treatment: Gait belt (O2) Activity Tolerance: Patient limited by fatigue Patient left: in chair;with call bell in reach General Behavior During Session: [facility] for tasks performed Cognition: [facility] for tasks performed  Rebeca Alert [facility] 11/21/2011, 3:32 PM [phone]

## 2011-11-21 NOTE — Progress Notes (Signed)
PO cardizem given at 0950. IV cardizem stopped at 1120 per MD order. Will continue to monitor. Julio Sicks RN

## 2011-11-21 NOTE — Progress Notes (Signed)
    Subjective:  No chest pain, dyspnea, palpitations, or lightheadedness.  Objective:  Vital Signs in the last 24 hours: Temp:  [97.4 F (36.3 C)-97.8 F (36.6 C)] 97.6 F (36.4 C) (03/28 0530) Pulse Rate:  [80-110] 107  (03/28 0530) Resp:  [16-18] 18  (03/28 0530) BP: (80-130)/(55-75) 111/75 mmHg (03/28 0530) SpO2:  [96 %-98 %] 96 % (03/28 0530) Weight:  [66.5 kg (146 lb 9.7 oz)] 66.5 kg (146 lb 9.7 oz) (03/28 0530)  Intake/Output from previous day: 03/27 0701 - 03/28 0700 In: 1064.5 [P.O.:480; I.V.:334.5; IV Piggyback:250] Out: 1275 [Urine:1275]  Physical Exam: Pt is alert and oriented, chronically ill-appearing male in NAD HEENT: normal Neck: JVP - normal, carotids 2+= without bruits Lungs: CTA bilaterally CV: Irregularly irregular without murmur or gallop Abd: soft, NT, Positive BS, no hepatomegaly Ext: no C/C/E, distal pulses intact and equal Skin: warm/dry no rash   Lab Results:  Basename 11/21/11 0450 11/20/11 0507  WBC 9.8 8.5  HGB 9.8* 9.4*  PLT 252 213    Basename 11/21/11 0450 11/20/11 0507  NA 134* 133*  K 4.0 3.9  CL 97 97  CO2 28 27  GLUCOSE 79 92  BUN 32* 37*  CREATININE 1.58* 1.63*   No results found for this basename: TROPONINI:2,CK,MB:2 in the last 72 hours  Tele: Atrial fibrillation, heart rate in 90s. One run of nonsustained ventricular tachycardia noted.  Assessment/Plan:  1. AFib with RVR - rate remains suboptimally controlled on IV diltiazem  2. Severe MR - nonsurgical candidate  3. Severe COPD - home O2 dependent  4. Hx GI Bleed - noncandidate for coumadin 5. NSVT  The patient's overall heart rate is improved, but he has had low blood pressure overnight. I'm going to stop his intravenous diltiazem and start him on oral Cardizem. I like him to stay on metoprolol 50 mg twice daily, and especially in light of the fact that he had nonsustained VT. We should keep his beta blocker dose as high as possible to try to prevent further VT.  Will DC IV diltiazem this morning and transition him to by mouth Cardizem. If he is stable today, I think he will be able to be discharged tomorrow.   Tonny Bollman, M.D. 11/21/2011, 8:40 AM

## 2011-11-21 NOTE — Progress Notes (Signed)
   CARE MANAGEMENT NOTE 11/21/2011  Patient:  [name], [name]   Account Number:  [id number]  Date Initiated:  11/18/2011  Documentation initiated by:  [facility]  Subjective/Objective Assessment:   ADMITTED W/BILAT KNEE PAIN.AFIB,RVR.     Action/Plan:   FROM HOME ALONE,BUT SISTER LIVES [address].HAS PCP,PHARMACY,TRANSP,HOME 02-[facility],CANE, RW.HAS TRAVEL 02 IN [place].HAS USED HH IN PAST.   Anticipated DC Date:  11/22/2011   Anticipated DC Plan:  HOME/SELF CARE      DC Planning Services  Patient refused services      Choice offered to / List presented to:             Status of service:  In process, will continue to follow Medicare Important Message given?   (If response is "NO", the following Medicare IM given date fields will be blank) Date Medicare IM given:   Date Additional Medicare IM given:    Discharge Disposition:    Per UR Regulation:  Reviewed for med. necessity/level of care/duration of stay  If discussed at Long Length of Stay Meetings, dates discussed:    Comments:  11/21/11 Nioka Thorington RN,BSN NCM [phone] CARDIO FOLLOWING.CARDIZEM GTT,UTI.PATIENT PLEASANTLY DECLINE HHC SERVICES.  11/18/11 Johnelle Tafolla RN,BSN NCM [phone] PT-HH.PLEASANTLY DECLINE HH,SAYS HE HAS USED IT IN PAST.PROVIDED [facility] AGENCY LIST AS RESOURCE.

## 2011-11-22 DIAGNOSIS — I472 Ventricular tachycardia: Secondary | ICD-10-CM

## 2011-11-22 DIAGNOSIS — I4729 Other ventricular tachycardia: Secondary | ICD-10-CM

## 2011-11-22 LAB — BASIC METABOLIC PANEL
CO2: 28 mEq/L (ref 19–32)
Chloride: 98 mEq/L (ref 96–112)
Creatinine, Ser: 1.76 mg/dL — ABNORMAL HIGH (ref 0.50–1.35)

## 2011-11-22 LAB — CBC
HCT: 32 % — ABNORMAL LOW (ref 39.0–52.0)
MCV: 97.3 fL (ref 78.0–100.0)
Platelets: 261 10*3/uL (ref 150–400)
RBC: 3.29 MIL/uL — ABNORMAL LOW (ref 4.22–5.81)
WBC: 11.4 10*3/uL — ABNORMAL HIGH (ref 4.0–10.5)

## 2011-11-22 LAB — GLUCOSE, CAPILLARY: Glucose-Capillary: 87 mg/dL (ref 70–99)

## 2011-11-22 MED ORDER — METOPROLOL TARTRATE 50 MG PO TABS: 50.0000 mg | ORAL_TABLET | Freq: Two times a day (BID) | ORAL | Status: DC

## 2011-11-22 MED ORDER — DILTIAZEM HCL ER COATED BEADS 180 MG PO CP24: 180.0000 mg | ORAL_CAPSULE | Freq: Every day | ORAL | Status: DC

## 2011-11-22 MED ORDER — CEPHALEXIN 500 MG PO CAPS: 500.0000 mg | ORAL_CAPSULE | Freq: Two times a day (BID) | ORAL | Status: AC

## 2011-11-22 MED ORDER — ASPIRIN 325 MG PO TBEC: 325.0000 mg | DELAYED_RELEASE_TABLET | Freq: Every day | ORAL | Status: AC

## 2011-11-22 NOTE — Progress Notes (Signed)
    Subjective:  Feels ok except for knee pain. No chest pain or palps.  Objective:  Vital Signs in the last 24 hours: Temp:  [97.6 F (36.4 C)] 97.6 F (36.4 C) (03/29 0536) Pulse Rate:  [77-84] 77  (03/29 0536) Resp:  [18-20] 18  (03/29 0536) BP: (106-126)/(68-80) 109/69 mmHg (03/29 0536) SpO2:  [96 %-98 %] 96 % (03/29 0536) Weight:  [67.8 kg (149 lb 7.6 oz)] 67.8 kg (149 lb 7.6 oz) (03/29 0536)  Intake/Output from previous day: 03/28 0701 - 03/29 0700 In: 1142 [P.O.:1080; I.V.:62] Out: 1400 [Urine:1400]  Physical Exam: Pt is alert and oriented, elderly male in NAD HEENT: normal Neck: JVP - normal, carotids 2+= without bruits Lungs: CTA bilaterally with decreased breath sounds CV: irregularly irregular with grade 2/6 holosystolic murmur at the apex Abd: soft, NT, Positive BS, no hepatomegaly Ext: no C/C/E, distal pulses intact and equal Skin: warm/dry no rash   Lab Results:  Basename 11/22/11 0456 11/21/11 0450  WBC 11.4* 9.8  HGB 9.8* 9.8*  PLT 261 252    Basename 11/22/11 0456 11/21/11 0450  NA 135 134*  K 3.9 4.0  CL 98 97  CO2 28 28  GLUCOSE 79 79  BUN 38* 32*  CREATININE 1.76* 1.58*   No results found for this basename: TROPONINI:2,CK,MB:2 in the last 72 hours  Tele: atrial fib heart rate 70-110, episodes of NSVT  Assessment/Plan:  1. AFib with RVR - rate remains suboptimally controlled on IV diltiazem  2. Severe MR - nonsurgical candidate  3. Severe COPD - home O2 dependent  4. Hx GI Bleed - noncandidate for coumadin  5. NSVT  Pt is stable. Home today on current doses of cardizem and metoprolol. Would tolerate heart rates in the 90's considering his history of symptomatic junctional bradycardia. Will arrange follow-up with Tereso Newcomer in our office for Dr Daleen Squibb.  Tonny Bollman, M.D. 11/22/2011, 9:03 AM

## 2011-11-22 NOTE — Progress Notes (Signed)
Patient had 9 beats of V Tach. Had a previous episode of 19 beats of V tach. Patient was asymptomatic. Will continue to monitor him.

## 2011-11-22 NOTE — Discharge Summary (Signed)
Physician Discharge Summary  Patient ID: [name] MRN: [id number] DOB/AGE: [birth year] 76 y.o.  Admit date: 11/16/2011 Discharge date: 11/22/2011  Primary Care Physician:  Beverley Fiedler, MD, MD   Discharge Diagnoses:    Principal Problem:  *Atrial fibrillation with RVR Active Problems:  Severe mitral regurgitation  Chronic diastolic heart failure  Chronic kidney disease  COPD (chronic obstructive pulmonary disease)  Leukocytosis  NSVT (nonsustained ventricular tachycardia)    Medication List  As of 11/22/2011  8:59 AM   TAKE these medications         albuterol 108 (90 BASE) MCG/ACT inhaler   Commonly known as: PROVENTIL HFA;VENTOLIN HFA   Inhale 2 puffs into the lungs every 6 (six) hours as needed. For shortness of breath      albuterol 2 MG tablet   Commonly known as: PROVENTIL   Take 2 mg by mouth 3 (three) times daily.      amLODipine 10 MG tablet   Commonly known as: NORVASC   Take 10 mg by mouth daily.      aspirin 325 MG EC tablet   Take 1 tablet (325 mg total) by mouth daily.      budesonide-formoterol 160-4.5 MCG/ACT inhaler   Commonly known as: SYMBICORT   Inhale 2 puffs into the lungs 2 (two) times daily.      cephALEXin 500 MG capsule   Commonly known as: KEFLEX   Take 1 capsule (500 mg total) by mouth every 12 (twelve) hours.      diltiazem 180 MG 24 hr capsule   Commonly known as: CARDIZEM CD   Take 1 capsule (180 mg total) by mouth daily.      hydrALAZINE 25 MG tablet   Commonly known as: APRESOLINE   Take 25 mg by mouth 3 (three) times daily.      LASIX 80 MG tablet   Generic drug: furosemide   Take 1 tablet (80 mg total) by mouth daily.      metoprolol 50 MG tablet   Commonly known as: LOPRESSOR   Take 1 tablet (50 mg total) by mouth 2 (two) times daily.      pantoprazole 40 MG tablet   Commonly known as: PROTONIX   Take 40 mg by mouth 2 (two) times daily.      tiotropium 18 MCG inhalation capsule   Commonly known as:  SPIRIVA   Place 18 mcg into inhaler and inhale daily.             Disposition and Follow-up:  Will be discharged home today in stable and improved condition. Followup with PCP in 2 weeks.   Consults:  cardiology Dr. Excell Seltzer   Significant Diagnostic Studies:  Dg Chest 1 View  11/17/2011  *RADIOLOGY REPORT*  Clinical Data: Short of breath.  Hypoxia.  CHEST - 1 VIEW  Comparison: 10/12/2011  Findings: Moderate cardiomegaly and ectasia of the thoracic aorta are stable.  Mild bibasilar scarring is unchanged.  No evidence of acute infiltrate or edema.  No evidence of pleural effusion.  IMPRESSION: Stable cardiomegaly and bibasilar scarring.  No acute findings.  Original Report Authenticated By: Danae Orleans, M.D.   Ct Angio Chest W/cm &/or Wo Cm  11/17/2011  *RADIOLOGY REPORT*  Clinical Data: Back pain, shortness of breath, leg pain,, question pulmonary embolism  CT ANGIOGRAPHY CHEST  Technique:  Multidetector CT imaging of the chest using the standard protocol during bolus administration of intravenous contrast. Multiplanar reconstructed images including MIPs were obtained and reviewed to evaluate  the vascular anatomy.  Contrast:  80 ml Omnipaque 300 IV.  Comparison: CT chest 05/01/2011  Findings: Extensive atherosclerotic calcifications aorta and coronary arteries. Cardiac chambers are enlarged particularly the left atrium. No aortic dissection identified. Aneurysmal dilatation of ascending thoracic aorta 4.3 x 4.0 cm image 54. No thoracic adenopathy. Visualized portion of upper abdomen unremarkable. Dilated central pulmonary arteries question pulmonary arterial hypertension.  Pulmonary arteries patent. No evidence of pulmonary embolism. Emphysematous changes with scattered peribronchial thickening. Bibasilar atelectasis and more focal areas of opacity likely representing infiltrate. Upper lungs clear. Bones demineralized.  IMPRESSION: No evidence of pulmonary embolism. Enlarged central pulmonary  arteries question pulmonary arterial hypertension. Aneurysmal dilatation of the ascending thoracic aorta with extensive atherosclerotic calcification of the aorta and coronary arteries. Changes of COPD with bibasilar atelectasis and questionable areas of infiltrate. Enlargement of cardiac chambers.  Original Report Authenticated By: Lollie Marrow, M.D.   Dg Knee Complete 4 Views Left  11/17/2011  *RADIOLOGY REPORT*  Clinical Data: Worsening knee pain.  LEFT KNEE - COMPLETE 4+ VIEW  Comparison: 10/12/2011  Findings: Severe medial compartment osteoarthritis is seen with tibia varus.  Generalized osteopenia is noted.  Small knee joint effusion also demonstrated.  No evidence of fracture or dislocation.  Peripheral vascular calcification again noted.  IMPRESSION:  1.  No acute findings. 2.  Severe medial compartment osteoarthritis and small knee joint effusion. 3.  Osteopenia and peripheral vascular calcification.  Original Report Authenticated By: Danae Orleans, M.D.   Dg Knee Complete 4 Views Right  11/17/2011  *RADIOLOGY REPORT*  Clinical Data: Worsening knee pain.  RIGHT KNEE - COMPLETE 4+ VIEW  Comparison: None.  Findings: No evidence of fracture or dislocation.  Small knee joint effusion noted.  Severe medial compartment osteoarthritis is seen with tibia varus.  Peripheral vascular calcification also noted. Osteopenia also demonstrated.  IMPRESSION:  1.  No acute findings. 2.  Severe medial compartment osteoarthritis and small knee joint effusion. 3.  Osteopenia and peripheral vascular calcification.  Original Report Authenticated By: Danae Orleans, M.D.    Brief H and P: For complete details please refer to admission H and P, but in brief patient is a 76yoM with h/o severe emphysema on home O2, diastolic HF/severe MR/severe TR, EF 55-60% and pulmonary HTN, AFib not on coumadin due to GIB, h/o alcoholic hepatitis, CKD stage III, and iliac aneurysm and AAA s/p graft repair 01/2011 presented to ED with  bilateral knee pain and found to be in AFib with RVR, leukocytosis. He comes in tonight for bilateral knee pain that is so severe he's having a hard time  walking. It's worse with movement and gets better if he lays down and rests. He doesn't  take anything but tylenol for pain. It was previously more swollen on the left but not so  much now, no erythema or warmth. For worsening pain, he presented to ED. In the ED, pt was in RVR up to 140. We were asked to admit him for further evaluation and management.       Hospital Course:  Principal Problem:  *Atrial fibrillation with RVR Active Problems:  Severe mitral regurgitation  Chronic diastolic heart failure  Chronic kidney disease  COPD (chronic obstructive pulmonary disease)  Leukocytosis  NSVT (nonsustained ventricular tachycardia)   #1 AFib with RVR: HR controlled with cardizem 180 and metoprolol 50 BID. HR in the 70-80s. He did have an episode of bradycardia-induced syncope in the past, so preferable to keep HR in the high side  of normal.  #2 E Coli UTI: Keflex BID for 4 more days on discharge.  #3 Bilateral Knee Pain: with osteoarthritis. Followup with ortho outpatient.  #4 NSVT: keep on metoprolol. Cards aware.   Time spent on Discharge: Greater than 30 minutes.  SignedChaya Jan [facility] Pager: [phone] 11/22/2011, 8:59 AM

## 2011-11-27 ENCOUNTER — Emergency Department (HOSPITAL_COMMUNITY): Payer: Medicare Other

## 2011-11-27 ENCOUNTER — Inpatient Hospital Stay (HOSPITAL_COMMUNITY)
Admission: EM | Admit: 2011-11-27 | Discharge: 2011-12-09 | DRG: 208 | Disposition: A | Discharge status: Home / Self Care | Payer: Medicare Other | Attending: Pulmonary Disease | Admitting: Pulmonary Disease

## 2011-11-27 ENCOUNTER — Encounter (HOSPITAL_COMMUNITY): Payer: Self-pay | Admitting: Emergency Medicine

## 2011-11-27 ENCOUNTER — Other Ambulatory Visit: Payer: Self-pay

## 2011-11-27 DIAGNOSIS — I340 Nonrheumatic mitral (valve) insufficiency: Secondary | ICD-10-CM

## 2011-11-27 DIAGNOSIS — N189 Chronic kidney disease, unspecified: Secondary | ICD-10-CM | POA: Diagnosis present

## 2011-11-27 DIAGNOSIS — R0600 Dyspnea, unspecified: Secondary | ICD-10-CM

## 2011-11-27 DIAGNOSIS — K2211 Ulcer of esophagus with bleeding: Secondary | ICD-10-CM

## 2011-11-27 DIAGNOSIS — I5033 Acute on chronic diastolic (congestive) heart failure: Secondary | ICD-10-CM | POA: Diagnosis present

## 2011-11-27 DIAGNOSIS — J9620 Acute and chronic respiratory failure, unspecified whether with hypoxia or hypercapnia: Principal | ICD-10-CM | POA: Diagnosis present

## 2011-11-27 DIAGNOSIS — R7401 Elevation of levels of liver transaminase levels: Secondary | ICD-10-CM | POA: Diagnosis present

## 2011-11-27 DIAGNOSIS — J9622 Acute and chronic respiratory failure with hypercapnia: Secondary | ICD-10-CM

## 2011-11-27 DIAGNOSIS — I10 Essential (primary) hypertension: Secondary | ICD-10-CM

## 2011-11-27 DIAGNOSIS — R0902 Hypoxemia: Secondary | ICD-10-CM | POA: Diagnosis present

## 2011-11-27 DIAGNOSIS — G9340 Encephalopathy, unspecified: Secondary | ICD-10-CM | POA: Diagnosis present

## 2011-11-27 DIAGNOSIS — E46 Unspecified protein-calorie malnutrition: Secondary | ICD-10-CM | POA: Diagnosis present

## 2011-11-27 DIAGNOSIS — D638 Anemia in other chronic diseases classified elsewhere: Secondary | ICD-10-CM | POA: Diagnosis present

## 2011-11-27 DIAGNOSIS — I472 Ventricular tachycardia: Secondary | ICD-10-CM

## 2011-11-27 DIAGNOSIS — J9601 Acute respiratory failure with hypoxia: Secondary | ICD-10-CM | POA: Diagnosis present

## 2011-11-27 DIAGNOSIS — I501 Left ventricular failure: Secondary | ICD-10-CM | POA: Diagnosis present

## 2011-11-27 DIAGNOSIS — Z8679 Personal history of other diseases of the circulatory system: Secondary | ICD-10-CM

## 2011-11-27 DIAGNOSIS — D72829 Elevated white blood cell count, unspecified: Secondary | ICD-10-CM

## 2011-11-27 DIAGNOSIS — I4891 Unspecified atrial fibrillation: Secondary | ICD-10-CM | POA: Diagnosis present

## 2011-11-27 DIAGNOSIS — J449 Chronic obstructive pulmonary disease, unspecified: Secondary | ICD-10-CM

## 2011-11-27 DIAGNOSIS — I509 Heart failure, unspecified: Secondary | ICD-10-CM | POA: Diagnosis present

## 2011-11-27 DIAGNOSIS — J441 Chronic obstructive pulmonary disease with (acute) exacerbation: Secondary | ICD-10-CM | POA: Diagnosis present

## 2011-11-27 DIAGNOSIS — G9341 Metabolic encephalopathy: Secondary | ICD-10-CM | POA: Diagnosis present

## 2011-11-27 DIAGNOSIS — Z9981 Dependence on supplemental oxygen: Secondary | ICD-10-CM

## 2011-11-27 DIAGNOSIS — Y95 Nosocomial condition: Secondary | ICD-10-CM

## 2011-11-27 DIAGNOSIS — D649 Anemia, unspecified: Secondary | ICD-10-CM

## 2011-11-27 DIAGNOSIS — I2789 Other specified pulmonary heart diseases: Secondary | ICD-10-CM | POA: Diagnosis present

## 2011-11-27 DIAGNOSIS — J189 Pneumonia, unspecified organism: Secondary | ICD-10-CM

## 2011-11-27 DIAGNOSIS — I059 Rheumatic mitral valve disease, unspecified: Secondary | ICD-10-CM | POA: Diagnosis present

## 2011-11-27 DIAGNOSIS — I5032 Chronic diastolic (congestive) heart failure: Secondary | ICD-10-CM

## 2011-11-27 DIAGNOSIS — N179 Acute kidney failure, unspecified: Secondary | ICD-10-CM | POA: Diagnosis present

## 2011-11-27 DIAGNOSIS — I129 Hypertensive chronic kidney disease with stage 1 through stage 4 chronic kidney disease, or unspecified chronic kidney disease: Secondary | ICD-10-CM | POA: Diagnosis present

## 2011-11-27 DIAGNOSIS — R7402 Elevation of levels of lactic acid dehydrogenase (LDH): Secondary | ICD-10-CM | POA: Diagnosis present

## 2011-11-27 LAB — DIFFERENTIAL
Basophils Absolute: 0 10*3/uL (ref 0.0–0.1)
Basophils Relative: 0 % (ref 0–1)
Eosinophils Absolute: 0 10*3/uL (ref 0.0–0.7)
Eosinophils Relative: 0 % (ref 0–5)
Lymphocytes Relative: 6 % — ABNORMAL LOW (ref 12–46)
Lymphs Abs: 0.9 10*3/uL (ref 0.7–4.0)
Monocytes Absolute: 0.8 10*3/uL (ref 0.1–1.0)
Monocytes Relative: 6 % (ref 3–12)
Neutro Abs: 12.5 10*3/uL — ABNORMAL HIGH (ref 1.7–7.7)
Neutrophils Relative %: 88 % — ABNORMAL HIGH (ref 43–77)

## 2011-11-27 LAB — BLOOD GAS, ARTERIAL
Acid-base deficit: 6.1 mmol/L — ABNORMAL HIGH (ref 0.0–2.0)
Bicarbonate: 17.2 mEq/L — ABNORMAL LOW (ref 20.0–24.0)
Drawn by: 347861
O2 Content: 2 L/min
O2 Saturation: 95 %
Patient temperature: 98.6
TCO2: 16 mmol/L (ref 0–100)
pCO2 arterial: 27.6 mmHg — ABNORMAL LOW (ref 35.0–45.0)
pH, Arterial: 7.41 (ref 7.350–7.450)
pO2, Arterial: 77.3 mmHg — ABNORMAL LOW (ref 80.0–100.0)

## 2011-11-27 LAB — COMPREHENSIVE METABOLIC PANEL
ALT: 32 U/L (ref 0–53)
AST: 40 U/L — ABNORMAL HIGH (ref 0–37)
Albumin: 3.4 g/dL — ABNORMAL LOW (ref 3.5–5.2)
Alkaline Phosphatase: 91 U/L (ref 39–117)
BUN: 52 mg/dL — ABNORMAL HIGH (ref 6–23)
CO2: 21 mEq/L (ref 19–32)
Calcium: 8.7 mg/dL (ref 8.4–10.5)
Chloride: 102 mEq/L (ref 96–112)
Creatinine, Ser: 2.56 mg/dL — ABNORMAL HIGH (ref 0.50–1.35)
GFR calc Af Amer: 27 mL/min — ABNORMAL LOW (ref >90)
GFR calc non Af Amer: 23 mL/min — ABNORMAL LOW (ref >90)
Glucose, Bld: 126 mg/dL — ABNORMAL HIGH (ref 70–99)
Potassium: 4.9 mEq/L (ref 3.5–5.1)
Sodium: 137 mEq/L (ref 135–145)
Total Bilirubin: 0.6 mg/dL (ref 0.3–1.2)
Total Protein: 7.4 g/dL (ref 6.0–8.3)

## 2011-11-27 LAB — CBC
HCT: 32 % — ABNORMAL LOW (ref 39.0–52.0)
Hemoglobin: 9.9 g/dL — ABNORMAL LOW (ref 13.0–17.0)
MCH: 30.5 pg (ref 26.0–34.0)
MCHC: 30.9 g/dL (ref 30.0–36.0)
MCV: 98.5 fL (ref 78.0–100.0)
Platelets: 229 10*3/uL (ref 150–400)
RBC: 3.25 MIL/uL — ABNORMAL LOW (ref 4.22–5.81)
RDW: 17.5 % — ABNORMAL HIGH (ref 11.5–15.5)
WBC: 14.2 10*3/uL — ABNORMAL HIGH (ref 4.0–10.5)

## 2011-11-27 LAB — LACTIC ACID, PLASMA: Lactic Acid, Venous: 4.1 mmol/L — ABNORMAL HIGH (ref 0.5–2.2)

## 2011-11-27 MED ORDER — ALBUTEROL SULFATE (5 MG/ML) 0.5% IN NEBU
5.0000 mg | INHALATION_SOLUTION | Freq: Once | RESPIRATORY_TRACT | Status: AC
  Administered 2011-11-27: 5 mg via RESPIRATORY_TRACT
  Filled 2011-11-27: qty 1

## 2011-11-27 MED ORDER — SODIUM CHLORIDE 0.9 % IV SOLN
Freq: Once | INTRAVENOUS | Status: AC
  Administered 2011-11-27: 100 mL/h via INTRAVENOUS

## 2011-11-27 MED ORDER — IPRATROPIUM BROMIDE 0.02 % IN SOLN
0.5000 mg | Freq: Once | RESPIRATORY_TRACT | Status: AC
  Administered 2011-11-27: 0.5 mg via RESPIRATORY_TRACT
  Filled 2011-11-27: qty 2.5

## 2011-11-27 NOTE — ED Notes (Signed)
Received pt.from Triage, pt. Alert and oriented,

## 2011-11-27 NOTE — ED Notes (Signed)
Pt made aware of need for urine; given urinal and encouraged to urinate as soon as possible

## 2011-11-27 NOTE — ED Notes (Signed)
Pt states he has pain in his lower back and states he just feels weak and tired all over  Pt states sxs started last night  Pt is on home oxygen at 2 liters/min via [place]  Pt appears to have some shortness of breath in triage  Family states pt was just released from hospital Friday morning after being admitted for pneumonia

## 2011-11-28 ENCOUNTER — Inpatient Hospital Stay (HOSPITAL_COMMUNITY): Payer: Medicare Other

## 2011-11-28 ENCOUNTER — Other Ambulatory Visit: Payer: Self-pay

## 2011-11-28 DIAGNOSIS — J9622 Acute and chronic respiratory failure with hypercapnia: Secondary | ICD-10-CM | POA: Diagnosis present

## 2011-11-28 DIAGNOSIS — J9600 Acute respiratory failure, unspecified whether with hypoxia or hypercapnia: Secondary | ICD-10-CM

## 2011-11-28 DIAGNOSIS — J189 Pneumonia, unspecified organism: Secondary | ICD-10-CM | POA: Diagnosis present

## 2011-11-28 DIAGNOSIS — J9601 Acute respiratory failure with hypoxia: Secondary | ICD-10-CM | POA: Diagnosis present

## 2011-11-28 DIAGNOSIS — G9340 Encephalopathy, unspecified: Secondary | ICD-10-CM | POA: Diagnosis present

## 2011-11-28 DIAGNOSIS — N179 Acute kidney failure, unspecified: Secondary | ICD-10-CM | POA: Diagnosis present

## 2011-11-28 DIAGNOSIS — J9620 Acute and chronic respiratory failure, unspecified whether with hypoxia or hypercapnia: Principal | ICD-10-CM

## 2011-11-28 DIAGNOSIS — I501 Left ventricular failure: Secondary | ICD-10-CM | POA: Diagnosis present

## 2011-11-28 DIAGNOSIS — J441 Chronic obstructive pulmonary disease with (acute) exacerbation: Secondary | ICD-10-CM

## 2011-11-28 LAB — COMPREHENSIVE METABOLIC PANEL WITH GFR
ALT: 39 U/L (ref 0–53)
AST: 49 U/L — ABNORMAL HIGH (ref 0–37)
Albumin: 3 g/dL — ABNORMAL LOW (ref 3.5–5.2)
Alkaline Phosphatase: 87 U/L (ref 39–117)
BUN: 54 mg/dL — ABNORMAL HIGH (ref 6–23)
CO2: 21 meq/L (ref 19–32)
Calcium: 8.2 mg/dL — ABNORMAL LOW (ref 8.4–10.5)
Chloride: 105 meq/L (ref 96–112)
Creatinine, Ser: 2.79 mg/dL — ABNORMAL HIGH (ref 0.50–1.35)
GFR calc Af Amer: 24 mL/min — ABNORMAL LOW
GFR calc non Af Amer: 21 mL/min — ABNORMAL LOW
Glucose, Bld: 115 mg/dL — ABNORMAL HIGH (ref 70–99)
Potassium: 4.8 meq/L (ref 3.5–5.1)
Sodium: 139 meq/L (ref 135–145)
Total Bilirubin: 0.5 mg/dL (ref 0.3–1.2)
Total Protein: 6.7 g/dL (ref 6.0–8.3)

## 2011-11-28 LAB — URINALYSIS, ROUTINE W REFLEX MICROSCOPIC
Glucose, UA: NEGATIVE mg/dL
Hgb urine dipstick: NEGATIVE
Nitrite: NEGATIVE
Protein, ur: 100 mg/dL — AB
Specific Gravity, Urine: 1.03 (ref 1.005–1.030)
Urobilinogen, UA: 1 mg/dL (ref 0.0–1.0)
pH: 5 (ref 5.0–8.0)

## 2011-11-28 LAB — CBC
MCH: 30.7 pg (ref 26.0–34.0)
MCHC: 31.2 g/dL (ref 30.0–36.0)
MCV: 98.6 fL (ref 78.0–100.0)
Platelets: 190 10*3/uL (ref 150–400)

## 2011-11-28 LAB — CARDIAC PANEL(CRET KIN+CKTOT+MB+TROPI)
CK, MB: 3.2 ng/mL (ref 0.3–4.0)
CK, MB: 3.5 ng/mL (ref 0.3–4.0)
CK, MB: 3.7 ng/mL (ref 0.3–4.0)
Relative Index: INVALID (ref 0.0–2.5)
Total CK: 74 U/L (ref 7–232)
Troponin I: <0.3 ng/mL (ref <0.30)
Troponin I: <0.3 ng/mL

## 2011-11-28 LAB — URINE MICROSCOPIC-ADD ON

## 2011-11-28 LAB — GLUCOSE, CAPILLARY
Glucose-Capillary: 124 mg/dL — ABNORMAL HIGH (ref 70–99)
Glucose-Capillary: 130 mg/dL — ABNORMAL HIGH (ref 70–99)
Glucose-Capillary: 138 mg/dL — ABNORMAL HIGH (ref 70–99)

## 2011-11-28 LAB — LEGIONELLA ANTIGEN, URINE: Legionella Antigen, Urine: NEGATIVE

## 2011-11-28 LAB — BLOOD GAS, ARTERIAL
Acid-base deficit: 11 mmol/L — ABNORMAL HIGH (ref 0.0–2.0)
Acid-base deficit: 7.7 mmol/L — ABNORMAL HIGH (ref 0.0–2.0)
Acid-base deficit: 1.9 mmol/L (ref 0.0–2.0)
Bicarbonate: 17 mEq/L — ABNORMAL LOW (ref 20.0–24.0)
Delivery systems: POSITIVE
Drawn by: 317871
Drawn by: 129801
Expiratory PAP: 6
FIO2: 1 %
FIO2: 1 %
FIO2: 0.4 %
Inspiratory PAP: 12
MECHVT: 580 mL
O2 Saturation: 98.6 %
O2 Saturation: 97.7 %
PEEP: 5 cmH2O
Patient temperature: 98.6
Patient temperature: 98.6
RATE: 24 resp/min
RATE: 16 resp/min
pCO2 arterial: 42.5 mmHg (ref 35.0–45.0)
pH, Arterial: 7.259 — ABNORMAL LOW (ref 7.350–7.450)
pO2, Arterial: 295 mmHg — ABNORMAL HIGH (ref 80.0–100.0)

## 2011-11-28 LAB — PROTIME-INR
INR: 1.38 (ref 0.00–1.49)
Prothrombin Time: 17.2 s — ABNORMAL HIGH (ref 11.6–15.2)

## 2011-11-28 LAB — MRSA PCR SCREENING: MRSA by PCR: NEGATIVE

## 2011-11-28 LAB — PROCALCITONIN: Procalcitonin: 0.12 ng/mL

## 2011-11-28 LAB — STREP PNEUMONIAE URINARY ANTIGEN: Strep Pneumo Urinary Antigen: NEGATIVE

## 2011-11-28 LAB — PRO B NATRIURETIC PEPTIDE: Pro B Natriuretic peptide (BNP): 13987 pg/mL — ABNORMAL HIGH (ref 0–125)

## 2011-11-28 MED ORDER — MIDAZOLAM HCL 5 MG/ML IJ SOLN
2.0000 mg | Freq: Once | INTRAMUSCULAR | Status: AC
  Administered 2011-11-28: 2 mg via INTRAVENOUS

## 2011-11-28 MED ORDER — SODIUM CHLORIDE 0.9 % IV SOLN
25.0000 ug/h | INTRAVENOUS | Status: DC
  Administered 2011-11-28: 50 ug/h via INTRAVENOUS
  Administered 2011-11-29 (×3): 25 ug/h via INTRAVENOUS
  Filled 2011-11-28 (×2): qty 50

## 2011-11-28 MED ORDER — IPRATROPIUM BROMIDE 0.02 % IN SOLN
0.5000 mg | RESPIRATORY_TRACT | Status: DC | PRN
  Filled 2011-11-28 (×2): qty 2.5

## 2011-11-28 MED ORDER — FUROSEMIDE 10 MG/ML IJ SOLN
40.0000 mg | Freq: Two times a day (BID) | INTRAMUSCULAR | Status: DC
  Administered 2011-11-28 – 2011-12-01 (×6): 40 mg via INTRAVENOUS
  Filled 2011-11-28 (×9): qty 4

## 2011-11-28 MED ORDER — FUROSEMIDE 10 MG/ML IJ SOLN
80.0000 mg | Freq: Once | INTRAMUSCULAR | Status: AC
  Administered 2011-11-28: 80 mg via INTRAVENOUS
  Filled 2011-11-28: qty 8

## 2011-11-28 MED ORDER — IPRATROPIUM BROMIDE 0.02 % IN SOLN
0.5000 mg | Freq: Once | RESPIRATORY_TRACT | Status: AC
  Administered 2011-11-28: 0.5 mg via RESPIRATORY_TRACT

## 2011-11-28 MED ORDER — LEVOFLOXACIN IN D5W 750 MG/150ML IV SOLN
750.0000 mg | INTRAVENOUS | Status: AC
  Administered 2011-11-28 – 2011-12-02 (×3): 750 mg via INTRAVENOUS
  Filled 2011-11-28 (×3): qty 150

## 2011-11-28 MED ORDER — SODIUM CHLORIDE 0.9 % IV BOLUS (SEPSIS)
1000.0000 mL | Freq: Once | INTRAVENOUS | Status: AC
  Administered 2011-11-28: 1000 mL via INTRAVENOUS

## 2011-11-28 MED ORDER — ALBUTEROL SULFATE (5 MG/ML) 0.5% IN NEBU
5.0000 mg | INHALATION_SOLUTION | Freq: Once | RESPIRATORY_TRACT | Status: AC
  Administered 2011-11-28: 5 mg via RESPIRATORY_TRACT
  Filled 2011-11-28: qty 0.5

## 2011-11-28 MED ORDER — FENTANYL CITRATE 0.05 MG/ML IJ SOLN
INTRAMUSCULAR | Status: AC
  Administered 2011-11-28: 100 ug via INTRAVENOUS
  Filled 2011-11-28: qty 2

## 2011-11-28 MED ORDER — FENTANYL CITRATE 0.05 MG/ML IJ SOLN
100.0000 ug | Freq: Once | INTRAMUSCULAR | Status: AC
  Administered 2011-11-28: 100 ug via INTRAVENOUS

## 2011-11-28 MED ORDER — PANTOPRAZOLE SODIUM 40 MG IV SOLR
40.0000 mg | INTRAVENOUS | Status: DC
  Administered 2011-11-28 – 2011-12-03 (×6): 40 mg via INTRAVENOUS
  Filled 2011-11-28 (×7): qty 40

## 2011-11-28 MED ORDER — ETOMIDATE 2 MG/ML IV SOLN
INTRAVENOUS | Status: AC
  Administered 2011-11-28: 20 mg
  Filled 2011-11-28: qty 20

## 2011-11-28 MED ORDER — SODIUM CHLORIDE 0.9 % IV SOLN
1.0000 mg/h | INTRAVENOUS | Status: DC
  Administered 2011-11-28 – 2011-11-29 (×2): 1 mg/h via INTRAVENOUS
  Administered 2011-11-29: 2 mg/h via INTRAVENOUS
  Filled 2011-11-28 (×3): qty 10

## 2011-11-28 MED ORDER — SUCCINYLCHOLINE CHLORIDE 20 MG/ML IJ SOLN
INTRAMUSCULAR | Status: AC
  Filled 2011-11-28: qty 5

## 2011-11-28 MED ORDER — OXEPA PO LIQD
1000.0000 mL | ORAL | Status: DC
  Administered 2011-11-28 – 2011-11-30 (×4): 1000 mL
  Filled 2011-11-28 (×4): qty 1000

## 2011-11-28 MED ORDER — BIOTENE DRY MOUTH MT LIQD
1.0000 "application " | Freq: Four times a day (QID) | OROMUCOSAL | Status: DC
  Administered 2011-11-28 – 2011-12-09 (×38): 15 mL via OROMUCOSAL

## 2011-11-28 MED ORDER — HEPARIN SODIUM (PORCINE) 5000 UNIT/ML IJ SOLN
5000.0000 [IU] | Freq: Three times a day (TID) | INTRAMUSCULAR | Status: DC
  Administered 2011-11-28 – 2011-12-09 (×34): 5000 [IU] via SUBCUTANEOUS
  Filled 2011-11-28 (×38): qty 1

## 2011-11-28 MED ORDER — DEXTROSE 5 % IV SOLN
1.0000 g | Freq: Three times a day (TID) | INTRAVENOUS | Status: DC
  Administered 2011-11-28: 1 g via INTRAVENOUS
  Filled 2011-11-28 (×3): qty 1

## 2011-11-28 MED ORDER — SUCCINYLCHOLINE CHLORIDE 20 MG/ML IJ SOLN
INTRAMUSCULAR | Status: AC
  Filled 2011-11-28: qty 10

## 2011-11-28 MED ORDER — VANCOMYCIN HCL IN DEXTROSE 1-5 GM/200ML-% IV SOLN
1000.0000 mg | Freq: Once | INTRAVENOUS | Status: AC
Start: 2011-11-28 — End: 2011-11-28
  Administered 2011-11-28: 1000 mg via INTRAVENOUS
  Filled 2011-11-28: qty 200

## 2011-11-28 MED ORDER — IPRATROPIUM BROMIDE 0.02 % IN SOLN
RESPIRATORY_TRACT | Status: AC
  Filled 2011-11-28: qty 2.5

## 2011-11-28 MED ORDER — DEXTROSE 5 % IV SOLN
1.0000 g | INTRAVENOUS | Status: DC
  Administered 2011-11-28: 1 g via INTRAVENOUS
  Filled 2011-11-28 (×2): qty 1

## 2011-11-28 MED ORDER — VANCOMYCIN HCL 1000 MG IV SOLR
750.0000 mg | INTRAVENOUS | Status: DC
  Administered 2011-11-28: 750 mg via INTRAVENOUS
  Filled 2011-11-28 (×2): qty 750

## 2011-11-28 MED ORDER — ETOMIDATE 2 MG/ML IV SOLN
INTRAVENOUS | Status: AC
  Filled 2011-11-28: qty 20

## 2011-11-28 MED ORDER — ALBUTEROL SULFATE (5 MG/ML) 0.5% IN NEBU
INHALATION_SOLUTION | RESPIRATORY_TRACT | Status: AC
  Filled 2011-11-28: qty 0.5

## 2011-11-28 MED ORDER — IPRATROPIUM BROMIDE 0.02 % IN SOLN
0.5000 mg | RESPIRATORY_TRACT | Status: DC
  Administered 2011-11-28 – 2011-12-03 (×33): 0.5 mg via RESPIRATORY_TRACT
  Filled 2011-11-28 (×34): qty 2.5

## 2011-11-28 MED ORDER — LEVOFLOXACIN IN D5W 750 MG/150ML IV SOLN
750.0000 mg | INTRAVENOUS | Status: DC
Start: 2011-11-28 — End: 2011-11-28
  Filled 2011-11-28: qty 150

## 2011-11-28 MED ORDER — ALBUTEROL SULFATE (5 MG/ML) 0.5% IN NEBU
2.5000 mg | INHALATION_SOLUTION | RESPIRATORY_TRACT | Status: DC | PRN
  Filled 2011-11-28 (×6): qty 0.5

## 2011-11-28 MED ORDER — MIDAZOLAM HCL 5 MG/ML IJ SOLN
INTRAMUSCULAR | Status: AC
  Administered 2011-11-28: 2 mg via INTRAVENOUS
  Filled 2011-11-28: qty 1

## 2011-11-28 MED ORDER — INSULIN ASPART 100 UNIT/ML ~~LOC~~ SOLN
0.0000 [IU] | SUBCUTANEOUS | Status: DC
  Administered 2011-11-28: 2 [IU] via SUBCUTANEOUS
  Administered 2011-11-29: 0 [IU] via SUBCUTANEOUS
  Administered 2011-11-29 (×5): 2 [IU] via SUBCUTANEOUS
  Administered 2011-11-30: 3 [IU] via SUBCUTANEOUS
  Administered 2011-11-30 – 2011-12-01 (×6): 2 [IU] via SUBCUTANEOUS

## 2011-11-28 MED ORDER — ROCURONIUM BROMIDE 50 MG/5ML IV SOLN
INTRAVENOUS | Status: AC
  Filled 2011-11-28: qty 2

## 2011-11-28 MED ORDER — ASPIRIN 325 MG PO TABS
325.0000 mg | ORAL_TABLET | Freq: Every day | ORAL | Status: DC
  Administered 2011-11-28 – 2011-12-09 (×12): 325 mg via ORAL
  Filled 2011-11-28 (×12): qty 1

## 2011-11-28 MED ORDER — METHYLPREDNISOLONE SODIUM SUCC 125 MG IJ SOLR
80.0000 mg | Freq: Three times a day (TID) | INTRAMUSCULAR | Status: DC
  Administered 2011-11-28 – 2011-12-01 (×10): 80 mg via INTRAVENOUS
  Filled 2011-11-28 (×2): qty 1.28
  Filled 2011-11-28: qty 2
  Filled 2011-11-28 (×3): qty 1.28
  Filled 2011-11-28 (×2): qty 2
  Filled 2011-11-28 (×4): qty 1.28
  Filled 2011-11-28: qty 2

## 2011-11-28 MED ORDER — CHLORHEXIDINE GLUCONATE 0.12 % MT SOLN
15.0000 mL | Freq: Two times a day (BID) | OROMUCOSAL | Status: DC
  Administered 2011-11-28 – 2011-12-09 (×23): 15 mL via OROMUCOSAL
  Filled 2011-11-28 (×25): qty 15

## 2011-11-28 MED ORDER — LIDOCAINE HCL (CARDIAC) 20 MG/ML IV SOLN
INTRAVENOUS | Status: AC
  Filled 2011-11-28: qty 5

## 2011-11-28 MED ORDER — ALBUTEROL SULFATE (5 MG/ML) 0.5% IN NEBU
2.5000 mg | INHALATION_SOLUTION | RESPIRATORY_TRACT | Status: DC
  Administered 2011-11-28 – 2011-12-03 (×32): 2.5 mg via RESPIRATORY_TRACT
  Filled 2011-11-28 (×29): qty 0.5

## 2011-11-28 NOTE — Progress Notes (Signed)
ANTIBIOTIC CONSULT NOTE - INITIAL  Pharmacy Consult for vancomycin/antibiotic monitoring  Indication: PNA  No Known Allergies  Patient Measurements: Height: 5\' 10"  (177.8 cm) Weight: 150 lb (68.04 kg) IBW/kg (Calculated) : 73  Adjusted Body Weight:   Vital Signs: BP: 116/84 mmHg (04/04 0214) Pulse Rate: 69  (04/04 0214) Intake/Output from previous day:   Intake/Output from this shift:    Labs:  Basename 11/27/11 2241  WBC 14.2*  HGB 9.9*  PLT 229  LABCREA --  CREATININE 2.56*   Estimated Creatinine Clearance: 24.3 ml/min (by C-G formula based on Cr of 2.56). No results found for this basename: VANCOTROUGH:2,VANCOPEAK:2,VANCORANDOM:2,GENTTROUGH:2,GENTPEAK:2,GENTRANDOM:2,TOBRATROUGH:2,TOBRAPEAK:2,TOBRARND:2,AMIKACINPEAK:2,AMIKACINTROU:2,AMIKACIN:2, in the last 72 hours   Microbiology: Recent Results (from the past 720 hour(s))  URINE CULTURE     Status: Normal   Collection Time   11/18/11  2:39 PM      Component Value Range Status Comment   Specimen Description URINE, CLEAN CATCH   Final    Special Requests none   Final    Culture  Setup Time [id number]   Final    Colony Count >=100,000 COLONIES/ML   Final    Culture ESCHERICHIA COLI   Final    Report Status 11/21/2011 FINAL   Final    Organism ID, Bacteria ESCHERICHIA COLI   Final     Medical History: Past Medical History  Diagnosis Date  . Abdominal aortic aneurysm     s/p stent graft repair in 6/12  . Iliac artery aneurysm, bilateral   . Anemia   . Atrial fibrillation     no coumadin due to h/o GI bleeding. 05/2011 stopped  metoprolol, amiodarone, digoxin, diltiazem due to bradycardia   . Tibial plateau fracture     non displaced  . COPD (chronic obstructive pulmonary disease)     emphysema and severe outflow obstruction, on home O2  . Lung mass     right lower lobe spiculated mass  . Chronic kidney disease     Stage II  . Oxygen dependent   . CHF (congestive heart failure)     Diastolic Heart  failure, Hx of severe MR;  Echocardiogram 05/01/11: Mild LVH, EF 55-60%, mild AI, severe MR, severe LAE, moderate RAE, normal RVSF, moderate-severe TR, PASP 79, severe pulmonary hypertension  . Hypertension   . Osteoporosis   . Alcoholic hepatitis     Medications:  Anti-infectives     Start     Dose/Rate Route Frequency Ordered Stop   11/28/11 2200   ceFEPIme (MAXIPIME) 1 g in dextrose 5 % 50 mL IVPB        1 g 100 mL/hr over 30 Minutes Intravenous Every 24 hours 11/28/11 0306 12/06/11 2159   11/28/11 2200   vancomycin (VANCOCIN) 750 mg in sodium chloride 0.9 % 150 mL IVPB        750 mg 150 mL/hr over 60 Minutes Intravenous Every 24 hours 11/28/11 0306     11/28/11 0330   levofloxacin (LEVAQUIN) IVPB 750 mg        750 mg 100 mL/hr over 90 Minutes Intravenous Every 48 hours 11/28/11 0305 12/04/11 0329   11/28/11 0030   ceFEPIme (MAXIPIME) 1 g in dextrose 5 % 50 mL IVPB  Status:  Discontinued        1 g 100 mL/hr over 30 Minutes Intravenous 3 times per day 11/28/11 0024 11/28/11 0306   11/28/11 0030   levofloxacin (LEVAQUIN) IVPB 750 mg  Status:  Discontinued        750  mg 100 mL/hr over 90 Minutes Intravenous Every 24 hours 11/28/11 0024 11/28/11 0305   11/28/11 0030   vancomycin (VANCOCIN) IVPB 1000 mg/200 mL premix        1,000 mg 200 mL/hr over 60 Minutes Intravenous  Once 11/28/11 0030 11/28/11 0253         Assessment: Patient with PNA.  First dose of antibiotics already given in ED.  Antibiotic monitoring ordered for patient with poor renal function.    Goal of Therapy:  Vancomycin trough level 15-20 mcg/ml Cefepime/levofloxacin dosed based on patient weight and renal function   Plan:  Measure antibiotic drug levels at steady state Follow up culture results Vancomycin 750mg  iv q24hr Change cefepime to 1gm iv q24hr, levofloxacin to 750mg  iv q48hr  Aleene Davidson Crowford 11/28/2011,3:14 AM

## 2011-11-28 NOTE — Progress Notes (Signed)
CARE MANAGE MENT UTILIZATION REVIEW NOTE 11/28/2011     Patient:  [name], [name]   Account Number:  [id number]  Documented by:  ZOXWRU Zoi Devine   Per Ur Regulation Reviewed for med. necessity/level of care/duration of stay

## 2011-11-28 NOTE — Progress Notes (Signed)
Name: [name] MRN: [id number] DOB: 10/17/1935    LOS: 1  PCCM ADMISSION NOTE  History of Present Illness: 76 yo with GOLD stage IV COPD (on home oxygen 2 lpm), diastolic CHF (EF 40%), pulmonary hypertension, severe TR and recent [facility]spital admission brought to [facility] ED in respiratory distress likely secondary to acute pulmonary edema. Failed NIMV / BiPAP.  Intubated.  Lines / Drains: 4/3  Foley  Cultures: 4/3  Blood>>> 4/3 U. Strep>>>neg  Antibiotics: 4/3  Cefepime (empiric, HCAP)>>> 4/3  Levofloxacin (empiric, HCAP)>>> 4/3  Vancomycin (empiric, HCAP)>>>  Tests / Events: 4/3  CXR >>>  Pulmonary vascular congestion, possible LLL infiltrate 4/4 CXR>>>Appliances positioned as described and apparent satisfactory location. Persistent cardiac enlargement with pulmonary vascular congestion and interstitial edema.   Vital Signs: Filed Vitals:   11/28/11 1500 11/28/11 1600 11/28/11 1700 11/28/11 1800  BP: 127/74 126/82 111/68 119/77  Pulse: 81 72 92 76  Temp:  97.6 F (36.4 C)    TempSrc:  Axillary    Resp: 24 17 16 17   Height:      Weight:      SpO2: 97% 97% 98% 98%    I/O last 3 completed shifts: In: 335 [I.V.:95; NG/GT:80; IV Piggyback:160] Out: 2190 [Urine:2190]  Physical Examination: General:  Elderly male on vent Neuro:  Sedated HEENT:  OETT in place, mm pink /moist Neck:  JVD noted Cardiovascular:  Irregular rhythm, no tachycardia, distant heart sounds Lungs:  Prolonged expiratory phase, I+E wheezing, rales Abdomen:  Soft, nontender, nondistended, bowel sounds present Musculoskeletal:  Moves all extremities, no pedal edema Skin:  No rash  Ventilator settings: Vent Mode:  [-] PRVC FiO2 (%):  [40 %-100 %] 40 % Set Rate:  [16 bmp-24 bmp] 16 bmp Vt Set:  [500 mL-580 mL] 580 mL PEEP:  [5 cmH20] 5 cmH20 Plateau Pressure:  [13 cmH20-27 cmH20] 13 cmH20 Labs and Imaging:  Reviewed.  Please refer to the Assessment and Plan section for relevant  results.  ASSESSMENT AND PLAN  NEUROLOGIC A:  Encephalopathy secondary to sedation. P: -->  Versed / fentanyl gtt, goal RASS 0 to -1 -->  Daily WUA  PULMONARY  Lab 11/28/11 1720 11/28/11 0431 11/28/11 0223 11/27/11 2249  PHART 7.425 7.259* 7.158* 7.410  PCO2ART 33.4* 42.5 49.9* 27.6*  PO2ART 87.5 295.0* 216.0* 77.3*  HCO3 21.5 18.4* 17.0* 17.2*  O2SAT 97.7 99.8 98.6 95.0   A:  Acute hypoxemic / hypercarbic respiratory failure. Metabolic acidosis. Acute pulmonary edema, less likely acute COPD exacerbation or HCAP.  Pulmonary hypertension. P: -->  Full mechanical support -->  Goal pH>7.30, goal SpO2>92 -->  Follow up CXR -->  Bronchodilators via nebulizer -->  Solu-Medrol 80 mg IV q8h -->  Hold inhalers -->  Abx / cultures per ID section -->consider narrow if no evidence of infection 4/5  CARDIOVASCULAR  Lab 11/28/11 1451 11/28/11 0829 11/28/11 0400 11/27/11 2242 11/27/11 2241  TROPONINI <0.30 <0.30 <0.30 -- --  LATICACIDVEN -- -- -- 4.1* --  PROBNP -- -- -- -- 13987.0*   A:  Acute CHF (diastolic) exacerbation with acute pulmonary edema.  Atrial fibrillation / controlled rate.  History of HTN.  Initially hypotensive, now resolved.  Severe TR. P: -->  12-lead ECG -->  Trend cardiac enzymes (neg x2) -->  Lasix 40 mg IV q12h -->  ASA -->  Hold Metoprolol, Cardizem, Hydralazine as initially hypotensive, BP normal but soft 4/4  RENAL  Lab 11/28/11 0400 11/27/11 2241 11/22/11 0456  NA 139 137 135  K 4.8 4.9 --  CL 105 102 98  CO2 21 21 28   BUN 54* 52* 38*  CREATININE 2.79* 2.56* 1.76*  CALCIUM 8.2* 8.7 8.8  MG -- -- --  PHOS -- -- --   A:  Acute on chronic renal failure / acute kidney injury.  P: -->  BMP, Mg, Phos in AM  GASTROINTESTINAL  Lab 11/28/11 0400 11/27/11 2241  AST 49* 40*  ALT 39 32  ALKPHOS 87 91  BILITOT 0.5 0.6  PROT 6.7 7.4  ALBUMIN 3.0* 3.4*   A:  Mild transaminitis, possibly secondary to alcohol use.  Malnutrition. P: -->  GI Px -->   TF   HEMATOLOGIC  Lab 11/28/11 0400 11/27/11 2241 11/22/11 0456  HGB 9.1* 9.9* 9.8*  HCT 29.2* 32.0* 32.0*  PLT 190 229 261  INR 1.38 -- --  APTT -- -- --   A:  Chronic stable anemia, likely of chronic disease. P: -->  CBC in AM -->  DVT Px  INFECTIOUS  Lab 11/28/11 0400 11/27/11 2241 11/22/11 0456  WBC 15.9* 14.2* 11.4*  PROCALCITON -- 0.12 --   A:  Suspected HCAP, but PCT level speaks against it. P: -->  Cefepime, Levofloxacin and Vancomycin for now, but would reevaluate in AM 4/5 -->  Blood Cx, urine Strep   ENDOCRINE  Lab 11/28/11 1613 11/28/11 1219 11/28/11 0749 11/28/11 0637 11/22/11 0739  GLUCAP 115* 119* 106* 124* 87   A:  No active issues.  Risk of steroid-induced hyperglycemia. P: -->  CBG checks / SSI   BEST PRACTICE / DISPOSITION -->  ICU status under PCCM -->  Full code -->  NPO -->  Heparin [place] for DVT Px -->  Protonix IV for GI Px -->  Ventilator bundle -->  Family is not available    Canary Brim, NP-C [facility] Pgr: [phone]  Pt independently  seen and examined and available cxr's reviewed and I agree with above findings/ imp/ plan  The patient is critically ill with multiple organ systems failure and requires high complexity decision making for assessment and support, frequent evaluation and titration of therapies, application of advanced monitoring technologies and extensive interpretation of multiple databases. Critical Care Time devoted to patient care services described in this note is 30 minutes.    Sandrea Hughs, MD Pulmonary and Critical Care Medicine [facility] Cell [phone]

## 2011-11-28 NOTE — Progress Notes (Signed)
INITIAL ADULT NUTRITION ASSESSMENT Date: 11/28/2011   Time: 10:23 AM Reason for Assessment: NPO and Mechanical Ventillation  ASSESSMENT: Male 76 y.o.  Dx: Admit for respiratory distress Hx:  Past Medical History  Diagnosis Date  . Abdominal aortic aneurysm     s/p stent graft repair in 6/12  . Iliac artery aneurysm, bilateral   . Anemia   . Atrial fibrillation     no coumadin due to h/o GI bleeding. 05/2011 stopped  metoprolol, amiodarone, digoxin, diltiazem due to bradycardia   . Tibial plateau fracture     non displaced  . COPD (chronic obstructive pulmonary disease)     emphysema and severe outflow obstruction, on home O2  . Lung mass     right lower lobe spiculated mass  . Chronic kidney disease     Stage II  . Oxygen dependent   . CHF (congestive heart failure)     Diastolic Heart failure, Hx of severe MR;  Echocardiogram 05/01/11: Mild LVH, EF 55-60%, mild AI, severe MR, severe LAE, moderate RAE, normal RVSF, moderate-severe TR, PASP 79, severe pulmonary hypertension  . Hypertension   . Osteoporosis   . Alcoholic hepatitis     Related Meds:  Scheduled Meds:   . sodium chloride   Intravenous Once  . ipratropium  0.5 mg Nebulization Q4H   And  . albuterol  2.5 mg Nebulization Q4H  . albuterol  5 mg Nebulization Once  . albuterol  5 mg Nebulization Once  . albuterol      . antiseptic oral rinse  1 application Mouth Rinse QID  . aspirin  325 mg Oral Daily  . ceFEPime (MAXIPIME) IV  1 g Intravenous Q24H  . chlorhexidine  15 mL Mouth/Throat BID  . etomidate      . fentaNYL  100 mcg Intravenous Once  . fentaNYL  100 mcg Intravenous Once  . furosemide  40 mg Intravenous Q12H  . furosemide  80 mg Intravenous Once  . heparin subcutaneous  5,000 Units Subcutaneous Q8H  . insulin aspart  0-15 Units Subcutaneous Q4H  . ipratropium      . ipratropium  0.5 mg Nebulization Once  . ipratropium  0.5 mg Nebulization Once  . levofloxacin (LEVAQUIN) IV  750 mg Intravenous Q48H    . lidocaine (cardiac) 100 mg/74ml      . methylPREDNISolone (SOLU-MEDROL) injection  80 mg Intravenous Q8H  . midazolam  2 mg Intravenous Once  . midazolam  2 mg Intravenous Once  . pantoprazole (PROTONIX) IV  40 mg Intravenous Q24H  . rocuronium      . sodium chloride  1,000 mL Intravenous Once  . succinylcholine      . vancomycin  750 mg Intravenous Q24H  . vancomycin  1,000 mg Intravenous Once  . DISCONTD: ceFEPime (MAXIPIME) IV  1 g Intravenous Q8H  . DISCONTD: levofloxacin (LEVAQUIN) IV  750 mg Intravenous Q24H   Continuous Infusions:   . fentaNYL infusion INTRAVENOUS 50 mcg/hr (11/28/11 0400)  . midazolam (VERSED) infusion 1 mg/hr (11/28/11 0400)   PRN Meds:.albuterol, ipratropium   Ht: 5\' 10"  (177.8 cm)  Wt: 150 lb 2.1 oz (68.1 kg)  Ideal Wt: 75.45 kg % Ideal Wt: 90.3%  Usual Wt: unknown, patient intubated and sedated with no family present  Body mass index is 21.54 kg/(m^2). (WNL)  Food/Nutrition Related Hx: unknown. Patient is sedated and intubated for mechanical ventilation. Patient with OG tube in place. Prior to NPO diet restriction patient with documented PO intake of 80-100%  at meals.   Labs:  CMP     Component Value Date/Time   NA 139 11/28/2011 0400   K 4.8 11/28/2011 0400   CL 105 11/28/2011 0400   CO2 21 11/28/2011 0400   GLUCOSE 115* 11/28/2011 0400   BUN 54* 11/28/2011 0400   CREATININE 2.79* 11/28/2011 0400   CALCIUM 8.2* 11/28/2011 0400   CALCIUM 7.9* 05/13/2011 1426   PROT 6.7 11/28/2011 0400   ALBUMIN 3.0* 11/28/2011 0400   AST 49* 11/28/2011 0400   ALT 39 11/28/2011 0400   ALKPHOS 87 11/28/2011 0400   BILITOT 0.5 11/28/2011 0400   GFRNONAA 21* 11/28/2011 0400   GFRAA 24* 11/28/2011 0400    Intake/Output Summary (Last 24 hours) at 11/28/11 1026 Last data filed at 11/28/11 0800  Gross per 24 hour  Intake    185 ml  Output    510 ml  Net   -325 ml     Diet Order: NPO  Supplements/Tube Feeding: none at this time  IVF:    fentaNYL infusion INTRAVENOUS Last  Rate: 50 mcg/hr (11/28/11 0400)  midazolam (VERSED) infusion Last Rate: 1 mg/hr (11/28/11 0400)    Estimated Nutritional Needs:   Kcal: 1610-9604 Protein: 40.9-51.13 grams Fluid: per MD  NUTRITION DIAGNOSIS: -Inadequate oral intake (NI-2.1).  Status: Ongoing  RELATED TO: inability to eat  AS EVIDENCE BY: NPO diet restriction.  MONITORING/EVALUATION(Goals): Weight trends, Diet advancement/ TF initiation, I/O's, Labs 1. Initiation of TF with positive tolerance 2. Meet > 90% of estimated energy needs with nutrition support 3. Diet advancement as medically able.   EDUCATION NEEDS: -No education needs identified at this time  INTERVENTION: 1. Recommend initiation of TF via OG tube of Oxepa @ goal rate of 36ml/hr. Provides a total of 1080 ml, 1620 kcal (meets 100% of kcal needs), 67 g protein ( meets 131% of protein needs), and 853.2 ml free water.  2. RD to follow for nutrition plan of care.   Dietitian [phone]  DOCUMENTATION CODES Per approved criteria  -Not Applicable    Iven Finn [facility] 11/28/2011, 10:23 AM

## 2011-11-28 NOTE — H&P (Signed)
Name: [name] MRN: [id number] DOB: Jul 23, 1936    LOS: 1  PCCM ADMISSION NOTE  History of Present Illness: 76 yo with GOLD stage IV COPD (on home oxygen 2 lpm), diastolic CHF (EF 78%), pulmonary hypertension, severe TR and recent [facility] ED in respiratory distress likely secondary to acute pulmonary edema. Failed NIMV / BiPAP.  Intubated.  Lines / Drains: 4/3  Foley  Cultures: 4/3  Blood  Antibiotics: 4/3  Cefepime (empirical, HCAP) 4/3  Levofloxacin (empirical, HCAP) 4/3  Vancomycin (empirical, HCAP)  Tests / Events: 4/3  CXR >>>  Pulmonary vascular congestion, possible LLL infiltrate  The patient is intubated and unable to provide history, which was obtained for available medical records.    Past Medical History  Diagnosis Date  . Abdominal aortic aneurysm     s/p stent graft repair in 6/12  . Iliac artery aneurysm, bilateral   . Anemia   . Atrial fibrillation     no coumadin due to h/o GI bleeding. 05/2011 stopped  metoprolol, amiodarone, digoxin, diltiazem due to bradycardia   . Tibial plateau fracture     non displaced  . COPD (chronic obstructive pulmonary disease)     emphysema and severe outflow obstruction, on home O2  . Lung mass     right lower lobe spiculated mass  . Chronic kidney disease     Stage II  . Oxygen dependent   . CHF (congestive heart failure)     Diastolic Heart failure, Hx of severe MR;  Echocardiogram 05/01/11: Mild LVH, EF 55-60%, mild AI, severe MR, severe LAE, moderate RAE, normal RVSF, moderate-severe TR, PASP 79, severe pulmonary hypertension  . Hypertension   . Osteoporosis   . Alcoholic hepatitis    Past Surgical History  Procedure Date  . Arteriovenous graft placement   . Abdominal aortic aneurysm repair   . Bladder surgery   . Neck/back surgery    Prior to Admission medications   Medication Sig Start Date End Date Taking? Authorizing Provider  albuterol (PROVENTIL HFA;VENTOLIN HFA) 108 (90  BASE) MCG/ACT inhaler Inhale 2 puffs into the lungs every 6 (six) hours as needed. For shortness of breath 07/05/11 07/04/12 Yes Barbaraann Share, MD  albuterol (PROVENTIL) 2 MG tablet Take 2 mg by mouth 3 (three) times daily. 07/05/11 07/04/12 Yes Barbaraann Share, MD  amLODipine (NORVASC) 10 MG tablet Take 10 mg by mouth daily. 06/17/11 06/16/12 Yes Beatrice Lecher, PA  aspirin EC 325 MG EC tablet Take 1 tablet (325 mg total) by mouth daily. 11/22/11 12/02/11 Yes Estela [name] Blakes, MD  budesonide-formoterol [facility]) 160-4.5 MCG/ACT inhaler Inhale 2 puffs into the lungs 2 (two) times daily. 07/05/11 07/04/12 Yes Barbaraann Share, MD  cephALEXin (KEFLEX) 500 MG capsule Take 1 capsule (500 mg total) by mouth every 12 (twelve) hours. 11/22/11 12/02/11 Yes Estela [name] Blakes, MD  diltiazem (CARDIZEM CD) 180 MG 24 hr capsule Take 1 capsule (180 mg total) by mouth daily. 11/22/11 11/21/12 Yes Estela [name] Blakes, MD  furosemide (LASIX) 80 MG tablet Take 1 tablet (80 mg total) by mouth daily. 06/17/11  Yes Beatrice Lecher, PA  hydrALAZINE (APRESOLINE) 25 MG tablet Take 25 mg by mouth 3 (three) times daily. 06/17/11 06/16/12 Yes Beatrice Lecher, PA  metoprolol (LOPRESSOR) 50 MG tablet Take 1 tablet (50 mg total) by mouth 2 (two) times daily. 11/22/11 11/21/12 Yes Estela [name] Blakes, MD  pantoprazole (PROTONIX) 40 MG tablet Take 40  mg by mouth 2 (two) times daily.    Yes Historical Provider, MD  tiotropium (SPIRIVA) 18 MCG inhalation capsule Place 18 mcg into inhaler and inhale daily. 07/05/11 07/04/12 Yes Barbaraann Share, MD   Allergies No Known Allergies  Family History Family History  Problem Relation Age of Onset  . Emphysema Father   . Leukemia Father   . Heart disease Mother   . Rheum arthritis Mother   . Anemia Mother   . Hypertension Mother   . Coronary artery disease Mother    Social History  reports that he quit smoking about 2 years ago. His smoking use included Cigarettes. He has a  10 pack-year smoking history. He has never used smokeless tobacco. He reports that he does not drink alcohol or use illicit drugs.  Review Of Systems  Patient unable to provide  Vital Signs: Pulse Rate:  [38-69] 69  (04/04 0214) Resp:  [12-28] 24  (04/04 0214) BP: (73-161)/(46-102) 116/84 mmHg (04/04 0214) SpO2:  [86 %-100 %] 99 % (04/04 0214) Weight:  [68.04 kg (150 lb)] 68.04 kg (150 lb) (04/04 0159)   Physical Examination: General:  In respiratory distress, increased work of breathing, using accessory muscles Neuro:  Sedated HEENT:  OETT in place, large amount of pink frothy secretions  Neck:  JVD noted Cardiovascular:  Irregular rhythm, no tachycardia, distant heart sounds Lungs:  Prolonged expiratory phase, I+E wheezing, rales Abdomen:  Soft, nontender, nondistended, bowel sounds present Musculoskeletal:  Moves all extremities, no pedal edema Skin:  No rash  Ventilator settings:   Labs and Imaging:  Reviewed.  Please refer to the Assessment and Plan section for relevant results.  ASSESSMENT AND PLAN  NEUROLOGIC A:  Encephalopathy secondary to sedation. P: -->  Versed / fentanyl gtt, goal RASS 0 to -1 -->  Daily WUA  PULMONARY  Lab 11/28/11 0223 11/27/11 2249  PHART 7.158* 7.410  PCO2ART 49.9* 27.6*  PO2ART 216.0* 77.3*  HCO3 17.0* 17.2*  O2SAT 98.6 95.0   A:  Acute hypoxemic / hypercarbic respiratory failure. Metabolic acidosis. Acute pulmonary edema, less likely acute COPD exacerbation or HCAP.  Pulmonary hypertension. P: -->  Full mechanical support -->  Goal pH>7.30, goal SpO2>92 -->  Follow up ABG -->  Follow up CXR -->  Bronchodilators via nebulizer -->  Solu-Medrol 80 mg IV q8h -->  Hold inhalers -->  Abx / cultures per ID section  CARDIOVASCULAR  Lab 11/27/11 2242 11/27/11 2241  TROPONINI -- --  LATICACIDVEN 4.1* --  PROBNP -- 13987.0*   A:  Acute CHF (diastolic) exacerbation with acute pulmonary edema.  Atrial fibrillation / controlled rate.   History of HTN, now hypotensive.  Severe TR. P: -->  12-lead ECG -->  Trend cardiac enzymes -->  Lasix 80 mg IV x 1, then 40 mg IV q12h -->  ASA -->  Hold Metoprolol, Cardizem, Hydralazine as hypotensive  RENAL  Lab 11/27/11 2241 11/22/11 0456 11/21/11 0450  NA 137 135 134*  K 4.9 3.9 --  CL 102 98 97  CO2 21 28 28   BUN 52* 38* 32*  CREATININE 2.56* 1.76* 1.58*  CALCIUM 8.7 8.8 8.9  MG -- -- --  PHOS -- -- --   A:  Acute on chronic renal failure / acute kidney injury.  P: -->  BMP, Mg, Phos in AM  GASTROINTESTINAL  Lab 11/27/11 2241  AST 40*  ALT 32  ALKPHOS 91  BILITOT 0.6  PROT 7.4  ALBUMIN 3.4*   A:  Mild  transaminitis, possibly secondary to alcohol use.  Malnutrition. P: -->  GI Px -->  TF if not extubated x 24h  HEMATOLOGIC  Lab 11/27/11 2241 11/22/11 0456 11/21/11 0450  HGB 9.9* 9.8* 9.8*  HCT 32.0* 32.0* 31.0*  PLT 229 261 252  INR -- -- --  APTT -- -- --   A:  Chronic stable anemia, likely of chronic disease. P: -->  CBC in AM -->  DVT Px  INFECTIOUS  Lab 11/27/11 2241 11/22/11 0456 11/21/11 0450  WBC 14.2* 11.4* 9.8  PROCALCITON 0.12 -- --   A:  Suspected HCAP, but PCT level speaks against it. P: -->  Cefepime, Levofloxacin and Vancomycin for now, but would reevaluate in AM -->  Blood Cx, urine Strep / Legionella Ag  ENDOCRINE  Lab 11/22/11 0739 11/21/11 0812  GLUCAP 87 108*   A:  No active issues.  Risk of steroid-induced hyperglycemia. P: -->  CBG checks / SSI   BEST PRACTICE / DISPOSITION -->  ICU status under PCCM -->  Full code -->  NPO -->  Heparin Bondurant for DVT Px -->  Protonix IV for GI Px -->  Ventilator bundle -->  Family is not available  The patient is critically ill with multiple organ systems failure and requires high complexity decision making for assessment and support, frequent evaluation and titration of therapies, application of advanced monitoring technologies and extensive interpretation of multiple  databases. Critical Care Time devoted to patient care services described in this note is 60 minutes.  Orlean Bradford, M.D., F.C.C.P. Pulmonary and Critical Care Medicine [facility] Cell: [phone] Pager: [phone]  11/28/2011, 2:17 AM

## 2011-11-28 NOTE — ED Provider Notes (Signed)
Medical screening examination/treatment/procedure(s) were conducted as a shared visit with non-physician practitioner(s) and myself.  I personally evaluated the patient during the encounter  Pt with hypotension and probable  Pneumonia based on xray.  HCAP antibiotics have been ordered.  Will plan on admission to the hospital for further treatment  Celene Kras, MD 11/28/11 0028

## 2011-11-28 NOTE — Procedures (Signed)
Name:  KESEAN SERVISS MRN:  [id number] DOB:  1936/07/04  PROCEDURE NOTE  Procedure:  Central venous catheter placement.  Indications:  Need for intravenous access and hemodynamic monitoring.  Consent:  Consent was implied due to the emergency nature of the procedure.  Anesthesia:  A total of 10 mL of 1% Lidocaine was used for local infiltration anesthesia.  Procedure summary:  Appropriate equipment was assembled.  The patient was identified as [name] and safety timeout was performed. The patient was placed in Trendelenburg position.  Sterile technique was used. The patient's right anterior chest wall was prepped using chlorhexidine / alcohol scrub and the field was draped in usual sterile fashion with full body drape. After the adequate anesthesia was achieved, the right subclavian vein was cannulated with the introducer needle without difficulty. A guide wire was advanced through the introducer needle, which was then withdrawn. A small skin incision was made at the point of wire entry, the dilator was inserted over the guide wire and appropriate dilation was obtained. The dilator was removed and a [place] triple-lumen catheter was advanced over the guide wire, which was then removed.  All ports were aspirated and flushed with normal saline without difficulty. The catheter was secured into place with sutures. Antibiotic patch was placed and sterile dressing was applied. Post-procedure chest x-ray was ordered.  Complications:  No immediate complications were noted.  Hemodynamic parameters and oxygenation remained stable throughout the procedure.  Estimated blood loss:  Less then 5 mL.  Orlean Bradford, M.D., F.C.C.P. Pulmonary and Critical Care Medicine [facility] Cell: [phone]  11/28/2011, 3:46 AM

## 2011-11-28 NOTE — Procedures (Signed)
Name: [name] MRN: [id number] DOB: 1935-09-22   PROCEDURE NOTE  Procedure:  Endotracheal intubation.  Indication:  Acute respiratory failure  Consent:  Consent was implied due to the emergency nature of the procedure.  Anesthesia:  A total of 10 mg of Etomidate was given intravenously.  Procedure summary:  Appropriate equipment was assembled. The patient was identified as [name] and safety timeout was performed. The patient was placed supine, with head in sniffing position. After adequate level of anesthesia was achieved, a Mac 4 blade was inserted into the oropharynx and the vocal cords were visualized. A 8.0 endotracheal tube was inserted without difficulty and visualized going through the vocal cords. The stylette was removed and cuff inflated. Colorimetric change was noted on the CO2 meter. Breath sounds were heard over both lung fields equally. Tube was secured 24 cm lip line. Post procedure chest xray was ordered.  Complications:  No immediate complications were noted.  Hemodynamic parameters and oxygenation remained stable throughout the procedure.    Orlean Bradford, M.D., F.C.C.P. Pulmonary and Critical Care Medicine [facility] Cell: [phone] Pager: [phone]  11/28/2011, 3:44 AM

## 2011-11-28 NOTE — ED Provider Notes (Signed)
History     CSN: [id number]  Arrival date & time 11/27/11  2108   First MD Initiated Contact with Patient 11/27/11 2230      Chief Complaint  Patient presents with  . Back Pain  . Fatigue    HPI: Patient is a 76 y.o. male presenting with weakness. The history is provided by the patient.  Weakness Primary symptoms do not include fever, nausea or vomiting. The symptoms are worsening. The neurological symptoms are diffuse.  Additional symptoms include weakness.  Pt reports increasing weakness and SOB since yesterday. States was d/c'd from hosp on Friday after admission for PNA. States he ran out of his inhalers and became increasingly SOB. Pt also reports worsening back and diffuse joint pain. Reports hx of arthritis.  Pt denies CP, fever, N/V/D or other associated sx's.  Past Medical History  Diagnosis Date  . Abdominal aortic aneurysm     s/p stent graft repair in 6/12  . Iliac artery aneurysm, bilateral   . Anemia   . Atrial fibrillation     no coumadin due to h/o GI bleeding. 05/2011 stopped  metoprolol, amiodarone, digoxin, diltiazem due to bradycardia   . Tibial plateau fracture     non displaced  . COPD (chronic obstructive pulmonary disease)     emphysema and severe outflow obstruction, on home O2  . Lung mass     right lower lobe spiculated mass  . Chronic kidney disease     Stage II  . Oxygen dependent   . CHF (congestive heart failure)     Diastolic Heart failure, Hx of severe MR;  Echocardiogram 05/01/11: Mild LVH, EF 55-60%, mild AI, severe MR, severe LAE, moderate RAE, normal RVSF, moderate-severe TR, PASP 79, severe pulmonary hypertension  . Hypertension   . Osteoporosis   . Alcoholic hepatitis     Past Surgical History  Procedure Date  . Arteriovenous graft placement   . Abdominal aortic aneurysm repair   . Bladder surgery   . Neck/back surgery     Family History  Problem Relation Age of Onset  . Emphysema Father   . Leukemia Father   . Heart disease  Mother   . Rheum arthritis Mother   . Anemia Mother   . Hypertension Mother   . Coronary artery disease Mother     History  Substance Use Topics  . Smoking status: Former Smoker -- 0.5 packs/day for 20 years    Types: Cigarettes    Quit date: 08/26/2009  . Smokeless tobacco: Never Used  . Alcohol Use: No     history of ETOH abuse in the past      Review of Systems  Constitutional: Negative for fever.  Respiratory: Positive for shortness of breath.   Cardiovascular: Negative for chest pain.  Gastrointestinal: Negative for nausea, vomiting, abdominal pain and diarrhea.  Musculoskeletal: Positive for back pain and arthralgias.  Neurological: Positive for weakness.    Allergies  Review of patient's allergies indicates no known allergies.  Home Medications   Current Outpatient Rx  Name Route Sig Dispense Refill  . ALBUTEROL SULFATE HFA 108 (90 BASE) MCG/ACT IN AERS Inhalation Inhale 2 puffs into the lungs every 6 (six) hours as needed. For shortness of breath    . ALBUTEROL SULFATE 2 MG PO TABS Oral Take 2 mg by mouth 3 (three) times daily.    Marland Kitchen AMLODIPINE BESYLATE 10 MG PO TABS Oral Take 10 mg by mouth daily.    . ASPIRIN 325 MG PO  TBEC Oral Take 1 tablet (325 mg total) by mouth daily. 30 tablet   . BUDESONIDE-FORMOTEROL FUMARATE 160-4.5 MCG/ACT IN AERO Inhalation Inhale 2 puffs into the lungs 2 (two) times daily.    . CEPHALEXIN 500 MG PO CAPS Oral Take 1 capsule (500 mg total) by mouth every 12 (twelve) hours. 8 capsule 0  . DILTIAZEM HCL ER COATED BEADS 180 MG PO CP24 Oral Take 1 capsule (180 mg total) by mouth daily. 30 capsule 1  . FUROSEMIDE 80 MG PO TABS Oral Take 1 tablet (80 mg total) by mouth daily.    Marland Kitchen HYDRALAZINE HCL 25 MG PO TABS Oral Take 25 mg by mouth 3 (three) times daily.    Marland Kitchen METOPROLOL TARTRATE 50 MG PO TABS Oral Take 1 tablet (50 mg total) by mouth 2 (two) times daily. 60 tablet 1  . PANTOPRAZOLE SODIUM 40 MG PO TBEC Oral Take 40 mg by mouth 2 (two)  times daily.     Marland Kitchen TIOTROPIUM BROMIDE MONOHYDRATE 18 MCG IN CAPS Inhalation Place 18 mcg into inhaler and inhale daily.      BP 95/60  Pulse 48  Resp 16  SpO2 96%  Physical Exam  Constitutional: He appears well-developed and well-nourished. He appears lethargic. He appears toxic. He appears distressed.  HENT:  Head: Normocephalic and atraumatic.  Eyes: Conjunctivae are normal.  Neck: Neck supple.  Cardiovascular: Regular rhythm.  Bradycardia present.   Pulmonary/Chest: Tachypnea noted.       BBS w/ insp/exp wheezes, pt is mildly  tachypneac  Abdominal: Soft. Bowel sounds are normal.  Musculoskeletal: Normal range of motion.  Neurological: He appears lethargic.  Skin: Skin is warm and dry.  Psychiatric: He has a normal mood and affect.    ED Course  Procedures    Date: 11/28/2011  Rate: 50  Rhythm: atrial fibrillation  QRS Axis: LVH  Intervals:   ST/T Wave abnormalities: indeterminate  Conduction Disutrbances:none  Narrative Interpretation: Last EKG 11/16/2011 shows A-Fib w/ RVR and LVH  Old EKG Reviewed: changes noted  BBS slightly improved w/ neb. Pt remains tachypnea w/ exp wheezes. Pt is persistently hypotensive and increasingly lethargic. CXR reveals pulm vasc congestion, interstitial edema and new (L) lower lobe consolidation. ABG ph 7.4.  Leukocytosis 14.2, lactic acid 4.1, BNP 13,987.00 vs 4938,00 on 11/17/11, BUN/Creat 52/2.56 vs 38/1.76 on 11/22/11. I have discussed pt w/ Dr Roselyn Bering who has also seen and examine pt. Will initiate tx for HAP and consult w/ [facility] for admission.   0110:  I have spoken w/ Dr Conley Rolls w/ [facility] who has agreed to have pt evaluated for admission.  Labs Reviewed  CBC - Abnormal; Notable for the following:    WBC 14.2 (*)    RBC 3.25 (*)    Hemoglobin 9.9 (*)    HCT 32.0 (*)    RDW 17.5 (*)    All other components within normal limits  DIFFERENTIAL - Abnormal; Notable for the following:    Neutrophils Relative 88 (*)     Neutro Abs 12.5 (*)    Lymphocytes Relative 6 (*)    All other components within normal limits  LACTIC ACID, PLASMA - Abnormal; Notable for the following:    Lactic Acid, Venous 4.1 (*)    All other components within normal limits  COMPREHENSIVE METABOLIC PANEL - Abnormal; Notable for the following:    Glucose, Bld 126 (*)    BUN 52 (*)    Creatinine, Ser 2.56 (*)  Albumin 3.4 (*)    AST 40 (*)    GFR calc non Af Amer 23 (*)    GFR calc Af Amer 27 (*)    All other components within normal limits  BLOOD GAS, ARTERIAL - Abnormal; Notable for the following:    pCO2 arterial 27.6 (*)    pO2, Arterial 77.3 (*)    Bicarbonate 17.2 (*)    Acid-base deficit 6.1 (*)    All other components within normal limits  URINALYSIS, ROUTINE W REFLEX MICROSCOPIC  PRO B NATRIURETIC PEPTIDE  PROCALCITONIN   Dg Chest 2 View  11/27/2011  *RADIOLOGY REPORT*  Clinical Data: Shortness of breath and weakness  CHEST - 2 VIEW  Comparison: 11/17/2011  Findings: The patient is rotated in position.  The heart size is enlarged.  There is pulmonary vascular congestion with interstitial changes suggesting edema.  There appears to be increased density in the left lower lung which might be related to pleural thickening or fluid.  Focal consolidation behind the heart is not excluded.  No definite blunting of costophrenic angles.  No pneumothorax. Calcified and tortuous aorta.  Degenerative changes in the spine. Postoperative changes in the abdomen.  IMPRESSION: Cardiac enlargement with pulmonary vascular congestion, interstitial edema, and possible consolidation in the left lung base.  Fluid or thickening of the fissures on the left.  Original Report Authenticated By: Marlon Pel, M.D.     No diagnosis found.    MDM  HPI/PE and clinical findings c/w 1. HAP (w/ likley early sepsis) 2. CHF 3. Acute renal failure  Pt persistently hypotensive and increasingly lethargic. CXR reveals pulm vasc congestion,  interstitial edema and new (L) lower lobe consolidation. ABG ph 7.4.  Leukocytosis 14.2, lactic acid 4.1, BNP 13,987.00 vs 4938,00 on 11/17/11, BUN/Creat 52/2.56 vs 38/1.76 on 11/22/11.          Leanne Chang, NP 11/28/11 1429

## 2011-11-28 NOTE — H&P (Deleted)
Name: [name] MRN: [id number] DOB: [birth year]    LOS: 1  PCCM ADMISSION NOTE  History of Present Illness: 76 yo with GOLD stage IV COPD (on home oxygen 2 lpm), diastolic CHF (EF 78%), pulmonary hypertension, severe TR and recent [facility] admission brought to [facility] ED in respiratory distress likely secondary to acute pulmonary edema. Failed NIMV / BiPAP.  Intubated.  Lines / Drains: 4/3  Foley  Cultures: 4/3  Blood>>> 4/3 U. Strep>>>neg  Antibiotics: 4/3  Cefepime (empiric, HCAP)>>> 4/3  Levofloxacin (empiric, HCAP)>>> 4/3  Vancomycin (empiric, HCAP)>>>  Tests / Events: 4/3  CXR >>>  Pulmonary vascular congestion, possible LLL infiltrate 4/4 CXR>>>Appliances positioned as described and apparent satisfactory location. Persistent cardiac enlargement with pulmonary vascular congestion and interstitial edema.   Vital Signs: Filed Vitals:   11/28/11 0900 11/28/11 0905 11/28/11 1000 11/28/11 1200  BP: 109/67  108/69   Pulse: 51  52   Temp:    97.1 F (36.2 C)  TempSrc:    Axillary  Resp: 24  23   Height:      Weight:      SpO2: 96% 94% 92%     I/O last 3 completed shifts: In: 178 [I.V.:18; IV Piggyback:160] Out: 100 [Urine:100]  Physical Examination: General:  Elderly male on vent Neuro:  Sedated HEENT:  OETT in place, mm pink /moist Neck:  JVD noted Cardiovascular:  Irregular rhythm, no tachycardia, distant heart sounds Lungs:  Prolonged expiratory phase, I+E wheezing, rales Abdomen:  Soft, nontender, nondistended, bowel sounds present Musculoskeletal:  Moves all extremities, no pedal edema Skin:  No rash  Ventilator settings: Vent Mode:  [-] PRVC FiO2 (%):  [40 %-100 %] 40 % Set Rate:  [24 bmp] 24 bmp Vt Set:  [500 mL-580 mL] 580 mL PEEP:  [5 cmH20] 5 cmH20 Plateau Pressure:  [15 cmH20-27 cmH20] 15 cmH20 Labs and Imaging:  Reviewed.  Please refer to the Assessment and Plan section for relevant results.  ASSESSMENT AND PLAN  NEUROLOGIC A:   Encephalopathy secondary to sedation. P: -->  Versed / fentanyl gtt, goal RASS 0 to -1 -->  Daily WUA  PULMONARY  Lab 11/28/11 0431 11/28/11 0223 11/27/11 2249  PHART 7.259* 7.158* 7.410  PCO2ART 42.5 49.9* 27.6*  PO2ART 295.0* 216.0* 77.3*  HCO3 18.4* 17.0* 17.2*  O2SAT 99.8 98.6 95.0   A:  Acute hypoxemic / hypercarbic respiratory failure. Metabolic acidosis. Acute pulmonary edema, less likely acute COPD exacerbation or HCAP.  Pulmonary hypertension. P: -->  Full mechanical support -->  Goal pH>7.30, goal SpO2>92 -->  Follow up CXR -->  Bronchodilators via nebulizer -->  Solu-Medrol 80 mg IV q8h -->  Hold inhalers -->  Abx / cultures per ID section -->consider narrow if no evidence of infection 4/5  CARDIOVASCULAR  Lab 11/28/11 0829 11/28/11 0400 11/27/11 2242 11/27/11 2241  TROPONINI <0.30 <0.30 -- --  LATICACIDVEN -- -- 4.1* --  PROBNP -- -- -- 29562.1*   A:  Acute CHF (diastolic) exacerbation with acute pulmonary edema.  Atrial fibrillation / controlled rate.  History of HTN.  Initially hypotensive, now resolved.  Severe TR. P: -->  12-lead ECG -->  Trend cardiac enzymes (neg x2) -->  Lasix 40 mg IV q12h -->  ASA -->  Hold Metoprolol, Cardizem, Hydralazine as initially hypotensive, BP normal but soft 4/4  RENAL  Lab 11/28/11 0400 11/27/11 2241 11/22/11 0456  NA 139 137 135  K 4.8 4.9 --  CL 105 102 98  CO2 21 21  28  BUN 54* 52* 38*  CREATININE 2.79* 2.56* 1.76*  CALCIUM 8.2* 8.7 8.8  MG -- -- --  PHOS -- -- --   A:  Acute on chronic renal failure / acute kidney injury.  P: -->  BMP, Mg, Phos in AM  GASTROINTESTINAL  Lab 11/28/11 0400 11/27/11 2241  AST 49* 40*  ALT 39 32  ALKPHOS 87 91  BILITOT 0.5 0.6  PROT 6.7 7.4  ALBUMIN 3.0* 3.4*   A:  Mild transaminitis, possibly secondary to alcohol use.  Malnutrition. P: -->  GI Px -->  TF   HEMATOLOGIC  Lab 11/28/11 0400 11/27/11 2241 11/22/11 0456  HGB 9.1* 9.9* 9.8*  HCT 29.2* 32.0* 32.0*    PLT 190 229 261  INR 1.38 -- --  APTT -- -- --   A:  Chronic stable anemia, likely of chronic disease. P: -->  CBC in AM -->  DVT Px  INFECTIOUS  Lab 11/28/11 0400 11/27/11 2241 11/22/11 0456  WBC 15.9* 14.2* 11.4*  PROCALCITON -- 0.12 --   A:  Suspected HCAP, but PCT level speaks against it. P: -->  Cefepime, Levofloxacin and Vancomycin for now, but would reevaluate in AM 4/5 -->  Blood Cx, urine Strep   ENDOCRINE  Lab 11/28/11 1219 11/28/11 0749 11/28/11 0637 11/22/11 0739  GLUCAP 119* 106* 124* 87   A:  No active issues.  Risk of steroid-induced hyperglycemia. P: -->  CBG checks / SSI   BEST PRACTICE / DISPOSITION -->  ICU status under PCCM -->  Full code -->  NPO -->  Heparin Butts for DVT Px -->  Protonix IV for GI Px -->  Ventilator bundle -->  Family is not available    Canary Brim, NP-C [facility] Pgr: [phone]

## 2011-11-28 NOTE — ED Notes (Signed)
Pt Transferred to ICU

## 2011-11-29 ENCOUNTER — Inpatient Hospital Stay (HOSPITAL_COMMUNITY): Payer: Medicare Other

## 2011-11-29 LAB — CBC
Hemoglobin: 9 g/dL — ABNORMAL LOW (ref 13.0–17.0)
MCH: 30.9 pg (ref 26.0–34.0)
MCHC: 32 g/dL (ref 30.0–36.0)
MCV: 96.6 fL (ref 78.0–100.0)
RBC: 2.91 MIL/uL — ABNORMAL LOW (ref 4.22–5.81)

## 2011-11-29 LAB — BASIC METABOLIC PANEL
BUN: 55 mg/dL — ABNORMAL HIGH (ref 6–23)
CO2: 23 mEq/L (ref 19–32)
Calcium: 8.7 mg/dL (ref 8.4–10.5)
GFR calc non Af Amer: 25 mL/min — ABNORMAL LOW (ref >90)
Glucose, Bld: 120 mg/dL — ABNORMAL HIGH (ref 70–99)

## 2011-11-29 LAB — GLUCOSE, CAPILLARY
Glucose-Capillary: 122 mg/dL — ABNORMAL HIGH (ref 70–99)
Glucose-Capillary: 126 mg/dL — ABNORMAL HIGH (ref 70–99)
Glucose-Capillary: 135 mg/dL — ABNORMAL HIGH (ref 70–99)

## 2011-11-29 MED ORDER — SODIUM CHLORIDE 0.9 % IV SOLN
INTRAVENOUS | Status: DC
  Administered 2011-12-01 (×2): 20 mL/h via INTRAVENOUS
  Administered 2011-12-02: 500 mL via INTRAVENOUS

## 2011-11-29 MED ORDER — METOPROLOL TARTRATE 1 MG/ML IV SOLN
5.0000 mg | Freq: Four times a day (QID) | INTRAVENOUS | Status: DC
  Administered 2011-11-29 – 2011-11-30 (×3): 5 mg via INTRAVENOUS
  Filled 2011-11-29 (×5): qty 5

## 2011-11-29 NOTE — Progress Notes (Signed)
Name: [name] MRN: [id number] DOB: 1936-01-19    LOS: 2  [place] PCCM      Brief patient profile:  76 yo with GOLD stage IV COPD (on home oxygen 2 lpm), diastolic CHF (EF 11%), pulmonary hypertension, severe TR and recent [facility]spital admission brought to [facility] ED am 4/4 in respiratory distress likely secondary to acute pulmonary edema. Failed NIMV / BiPAP.  Intubated.  Lines / Drains: 4/3  Foley  Cultures: 4/3  Blood>>> 4/3 U. Strep>>>neg 4/4 U Legionella > neg  4/4 MRSA > neg 4/5 Trach asp >>>   Antibiotics: 4/3  Cefepime (empiric, HCAP)>>>4/5 4/3  Vancomycin (empiric, HCAP)> 4/5 4/3  Levofloxacin (empiric, HCAP/CAP)>>>   Tests / Events: 4/3  CXR >>>  Pulmonary vascular congestion, possible LLL infiltrate 4/4 CXR>>>Appliances positioned as described and apparent satisfactory location. Persistent cardiac enlargement with pulmonary vascular congestion and interstitial edema.   Vital Signs: Filed Vitals:   11/29/11 0500 11/29/11 0600 11/29/11 0700 11/29/11 0833  BP: 104/70  105/67   Pulse: 84 82 97   Temp:      TempSrc:      Resp: 15 14 16    Height:      Weight:      SpO2: 95% 95% 95% 96%  FIO2 0.30    Intake/Output Summary (Last 24 hours) at 11/29/11 1447 Last data filed at 11/29/11 1445  Gross per 24 hour  Intake 1511.53 ml  Output   2580 ml  Net -1068.47 ml    Physical Examination: General:  Elderly male on vent Neuro:  Awakens, follows commands, pupils 74mm=R HEENT:  OETT in place, mm pink /moist Neck:  JVD noted Cardiovascular:  Irregular rhythm, distant heart sounds Lungs:  Even/non-labored on vent, lungs bilaterally coarse Abdomen:  Soft, nontender, nondistended, bowel sounds present Musculoskeletal:  Moves all extremities, no pedal edema Skin:  No rash  Ventilator settings: Vent Mode:  [-] PRVC FiO2 (%):  [30 %-40 %] 30 % Set Rate:  [16 bmp-24 bmp] 16 bmp Vt Set:  [580 mL] 580 mL PEEP:  [5 cmH20] 5 cmH20 Pressure Support:  [5 cmH20] 5  cmH20 Plateau Pressure:  [13 cmH20-18 cmH20] 14 cmH20  Labs and Imaging:  Reviewed.  Please refer to the Assessment and Plan section for relevant results.  ASSESSMENT AND PLAN  NEUROLOGIC A:  Encephalopathy secondary to sedation. P: -->  Versed / fentanyl gtt, goal RASS 0 to -1 -->  Daily WUA  PULMONARY  Lab 11/28/11 1720 11/28/11 0431 11/28/11 0223 11/27/11 2249  PHART 7.425 7.259* 7.158* 7.410  PCO2ART 33.4* 42.5 49.9* 27.6*  PO2ART 87.5 295.0* 216.0* 77.3*  HCO3 21.5 18.4* 17.0* 17.2*  O2SAT 97.7 99.8 98.6 95.0   A:  Acute hypoxemic / hypercarbic respiratory failure. Metabolic acidosis. Acute pulmonary edema, less likely acute COPD exacerbation or HCAP.  Pulmonary hypertension. P: -->  Daily SBT -->  Follow up CXR -->  Bronchodilators via nebulizer -->  Solu-Medrol 80 mg IV q8h -->  Hold inhalers -->  Abx / cultures per ID section -->narrow abx 4/5  CARDIOVASCULAR  Lab 11/28/11 1451 11/28/11 0829 11/28/11 0400 11/27/11 2242 11/27/11 2241  TROPONINI <0.30 <0.30 <0.30 -- --  LATICACIDVEN -- -- -- 4.1* --  PROBNP -- -- -- -- 13987.0*   A:  Acute CHF (diastolic) exacerbation with acute pulmonary edema.  Atrial fibrillation / controlled rate.  History of HTN.  Initially hypotensive, now resolved.  Severe TR. P: -->  Negative cardiac enzymes  -->  Lasix 40  mg IV q12h -->  ASA -->  Hold PO Metoprolol Cardizem, Hydralazine as initially hypotensive, BP normal but soft 4/4 -->  Schedule IV metoprolol Q6, Hold above for now  RENAL  Lab 11/29/11 0400 11/28/11 0400 11/27/11 2241  NA 140 139 137  K 3.7 4.8 --  CL 104 105 102  CO2 23 21 21   BUN 55* 54* 52*  CREATININE 2.41* 2.79* 2.56*  CALCIUM 8.7 8.2* 8.7  MG -- -- --  PHOS -- -- --   A:  Acute on chronic renal failure / acute kidney injury.  P: -->  BMP in AM  GASTROINTESTINAL  Lab 11/28/11 0400 11/27/11 2241  AST 49* 40*  ALT 39 32  ALKPHOS 87 91  BILITOT 0.5 0.6  PROT 6.7 7.4  ALBUMIN 3.0* 3.4*   A:   Mild transaminitis, possibly secondary to alcohol use.  Malnutrition. P: -->  GI Px -->  TF   HEMATOLOGIC  Lab 11/29/11 0400 11/28/11 0400 11/27/11 2241  HGB 9.0* 9.1* 9.9*  HCT 28.1* 29.2* 32.0*  PLT 166 190 229  INR -- 1.38 --  APTT -- -- --   A:  Chronic stable anemia, likely of chronic disease. P: -->  CBC in AM -->  DVT Px  INFECTIOUS  Lab 11/29/11 0400 11/28/11 0400 11/27/11 2241  WBC 11.3* 15.9* 14.2*  PROCALCITON -- -- 0.12   A:  Suspected HCAP, but PCT level speaks against it. P: -->  Narrow abx to Levofloxacin 4/5  -->  Cultures as per dashboard  ENDOCRINE  Lab 11/29/11 0729 11/29/11 0343 11/29/11 0046 11/28/11 2231 11/28/11 2015  GLUCAP 135* 122* 122* 138* 130*   A:  No active issues.  Risk of steroid-induced hyperglycemia. P: -->  CBG checks / SSI   BEST PRACTICE / DISPOSITION -->  ICU status under PCCM -->  Full code -->  NPO -->  Heparin [place] for DVT Px -->  Protonix IV for GI Px -->  Ventilator bundle -->  Family is not available    Canary Brim, NP-C [place] [facility] Pgr: [phone]

## 2011-11-29 NOTE — Clinical Documentation Improvement (Signed)
    GENERIC DOCUMENTATION CLARIFICATION QUERY  THIS DOCUMENT IS NOT A PERMANENT PART OF THE MEDICAL RECORD  TO RESPOND TO THE THIS QUERY, FOLLOW THE INSTRUCTIONS BELOW:  1. If needed, update documentation for the patient's encounter via the notes activity.  2. Access this query again and click edit on the In [employer].  3. After updating, or not, click F2 to complete all highlighted (required) fields concerning your review. Select "additional documentation in the medical record" OR "no additional documentation provided".  4. Click Sign note button.  5. The deficiency will fall out of your In Basket *Please let us know if you are not able to complete this workflow by phone or e-mail (listed below).  Please update your documentation within the medical record to reflect your response to this query.                                                                                        11/29/11   Dear Dr. Marin Shutter, [facility],  In a better effort to capture your patient's severity of illness, reflect appropriate length of stay and utilization of resources, a review of the patient medical record has revealed the following indicators.    Based on your clinical judgment, please clarify and document in a progress note and/or discharge summary the clinical condition associated with the following supporting information:  In responding to this query please exercise your independent judgment.  The fact that a query is asked, does not imply that any particular answer is desired or expected.  Pt admitted with Acute hypoxemic / hypercarbic respiratory failure and Acute CHF (diastolic) exacerbation   According to lab pt's U/A noted turbid urine with moderate leukocytes in setting of Acute on chronic ARF.   Please clarify based on abnormal lab result whether or not U/A  can be further specified as one of the diagnoses listed below and document in pn or d/c summary.    Possible Clinical  Conditions?  ____________________  ____________________ ____________________ _______Other Condition__________________ _______Cannot Clinically Determine   Supporting Information:  Risk Factors: Encephalopathy, Acute hypoxic/hypercarbic Resp Failure, Acute CHF (diastolic) exacerbation, Mild transaminitis  Signs & Symptoms:  Diagnostics: Component     Latest Ref Rng 11/28/2011  Color, Urine     YELLOW AMBER (A)  APPearance     CLEAR TURBID (A)  Bilirubin Urine     NEGATIVE MODERATE (A)  Ketones, ur     NEGATIVE mg/dL TRACE (A)  Protein     NEGATIVE mg/dL 161 (A)  Leukocytes, UA     NEGATIVE MODERATE (A)   Treatment 4/3  Cefepime  4/3  Levofloxacin  4/3  Vancomycin   You may use possible, probable, or suspect with inpatient documentation. possible, probable, suspected diagnoses MUST be documented at the time of discharge  Reviewed:  no additional documentation provided  Thank You,  Enis Slipper RN, BSN, CCDS Clinical Documentation Specialist [facility] Dept Pager: [phone] / E-mail: [email or web address]  Health Information Management [place]

## 2011-11-29 NOTE — Progress Notes (Signed)
Patient remains resting comfortably and arouses to voice and is able to nod/ follow commands. Admission documentation was not able to be completed with patient on ventilator/ sedated. No family here throughout the night to answer questions. Patient able to nod in response to questions but orientation is questionable. Will wait to complete documentation.

## 2011-11-29 NOTE — Progress Notes (Signed)
E link called and notified that patient remained in A fib with controlled rate.

## 2011-11-29 NOTE — Progress Notes (Signed)
Brief Nutrition Note  CMP     Component Value Date/Time   NA 140 11/29/2011 0400   K 3.7 11/29/2011 0400   CL 104 11/29/2011 0400   CO2 23 11/29/2011 0400   GLUCOSE 120* 11/29/2011 0400   BUN 55* 11/29/2011 0400   CREATININE 2.41* 11/29/2011 0400   CALCIUM 8.7 11/29/2011 0400   CALCIUM 7.9* 05/13/2011 1426   PROT 6.7 11/28/2011 0400   ALBUMIN 3.0* 11/28/2011 0400   AST 49* 11/28/2011 0400   ALT 39 11/28/2011 0400   ALKPHOS 87 11/28/2011 0400   BILITOT 0.5 11/28/2011 0400   GFRNONAA 25* 11/29/2011 0400   GFRAA 29* 11/29/2011 0400    Patient on Oxepa via OG tube at goal rate of 45 ml/hr with positive tolerance. TF currently meeting >90% of estimated energy needs. Will continue to monitor for tolerance/ nutrition needs.   Iven Finn [facility] [phone]

## 2011-11-30 DIAGNOSIS — I4891 Unspecified atrial fibrillation: Secondary | ICD-10-CM

## 2011-11-30 LAB — CBC
MCHC: 31.4 g/dL (ref 30.0–36.0)
MCV: 96.7 fL (ref 78.0–100.0)
Platelets: 201 10*3/uL (ref 150–400)
RDW: 18.2 % — ABNORMAL HIGH (ref 11.5–15.5)
WBC: 19.2 10*3/uL — ABNORMAL HIGH (ref 4.0–10.5)

## 2011-11-30 LAB — GLUCOSE, CAPILLARY
Glucose-Capillary: 168 mg/dL — ABNORMAL HIGH (ref 70–99)
Glucose-Capillary: 123 mg/dL — ABNORMAL HIGH (ref 70–99)
Glucose-Capillary: 118 mg/dL — ABNORMAL HIGH (ref 70–99)
Glucose-Capillary: 111 mg/dL — ABNORMAL HIGH (ref 70–99)

## 2011-11-30 LAB — BASIC METABOLIC PANEL
BUN: 64 mg/dL — ABNORMAL HIGH (ref 6–23)
CO2: 26 mEq/L (ref 19–32)
Calcium: 8.9 mg/dL (ref 8.4–10.5)
Creatinine, Ser: 2.34 mg/dL — ABNORMAL HIGH (ref 0.50–1.35)

## 2011-11-30 MED ORDER — DILTIAZEM HCL 100 MG IV SOLR
5.0000 mg/h | INTRAVENOUS | Status: DC
  Administered 2011-11-30: 5 mg/h via INTRAVENOUS
  Administered 2011-12-01 – 2011-12-03 (×8): 15 mg/h via INTRAVENOUS
  Filled 2011-11-30: qty 100

## 2011-11-30 MED ORDER — METOPROLOL TARTRATE 25 MG/10 ML ORAL SUSPENSION
25.0000 mg | Freq: Four times a day (QID) | ORAL | Status: DC
  Administered 2011-11-30 – 2011-12-01 (×4): 25 mg via ORAL
  Filled 2011-11-30 (×11): qty 10

## 2011-11-30 MED ORDER — METOPROLOL TARTRATE 1 MG/ML IV SOLN
5.0000 mg | Freq: Once | INTRAVENOUS | Status: AC
  Administered 2011-11-30: 5 mg via INTRAVENOUS

## 2011-11-30 MED ORDER — FENTANYL CITRATE 0.05 MG/ML IJ SOLN
25.0000 ug | INTRAMUSCULAR | Status: DC | PRN
  Administered 2011-11-30 – 2011-12-01 (×2): 50 ug via INTRAVENOUS
  Filled 2011-11-30 (×3): qty 2

## 2011-11-30 MED ORDER — MIDAZOLAM HCL 2 MG/2ML IJ SOLN: 2.0000 mg | Freq: Once | INTRAMUSCULAR | Status: DC

## 2011-11-30 MED ORDER — METOPROLOL TARTRATE 25 MG/10 ML ORAL SUSPENSION: 25.0000 mg | ORAL | Status: DC

## 2011-11-30 MED ORDER — POTASSIUM CHLORIDE 20 MEQ/15ML (10%) PO LIQD
40.0000 meq | Freq: Once | ORAL | Status: AC
  Administered 2011-11-30: 40 meq
  Filled 2011-11-30: qty 30

## 2011-11-30 MED ORDER — METOPROLOL TARTRATE 25 MG/10 ML ORAL SUSPENSION
25.0000 mg | Freq: Four times a day (QID) | ORAL | Status: DC
  Administered 2011-11-30: 25 mg via ORAL
  Filled 2011-11-30 (×5): qty 10

## 2011-11-30 NOTE — Progress Notes (Addendum)
Name: [name] MRN: [id number] DOB: 1935-09-03    LOS: 3  [facility]      Brief patient profile:  76 yo with GOLD stage IV COPD (on home oxygen 2 lpm), diastolic CHF (EF 40%), pulmonary hypertension, severe TR and recent hopspital admission brought to [facility] ED am 4/4 in respiratory distress likely secondary to acute pulmonary edema. Failed NIMV / BiPAP.  Intubated.  Lines / Drains: 4/3  Foley  Cultures: 4/3  Blood>>> 4/3 U. Strep>>>neg 4/4 U Legionella > neg  4/4 MRSA > neg 4/5 Trach asp >>>  Recent Results (from the past 240 hour(s))  CULTURE, BLOOD (ROUTINE X 2)     Status: Normal (Preliminary result)   Collection Time   11/28/11  4:00 AM      Component Value Range Status Comment   Specimen Description BLOOD NO SITE GIVEN   Final    Special Requests BOTTLES DRAWN AEROBIC AND ANAEROBIC   Final    Culture  Setup Time [id number]   Final    Culture     Final    Value:        BLOOD CULTURE RECEIVED NO GROWTH TO DATE CULTURE WILL BE HELD FOR 5 DAYS BEFORE ISSUING A FINAL NEGATIVE REPORT   Report Status PENDING   Incomplete   CULTURE, BLOOD (ROUTINE X 2)     Status: Normal (Preliminary result)   Collection Time   11/28/11  4:12 AM      Component Value Range Status Comment   Specimen Description BLOOD LEFT ARM   Final    Special Requests BOTTLES DRAWN AEROBIC ONLY [facility]   Final    Culture  Setup Time [id number]   Final    Culture     Final    Value:        BLOOD CULTURE RECEIVED NO GROWTH TO DATE CULTURE WILL BE HELD FOR 5 DAYS BEFORE ISSUING A FINAL NEGATIVE REPORT   Report Status PENDING   Incomplete   MRSA PCR SCREENING     Status: Normal   Collection Time   11/28/11  4:26 AM      Component Value Range Status Comment   MRSA by PCR NEGATIVE  NEGATIVE  Final      Antibiotics: 4/3  Cefepime (empiric, HCAP)>>>4/5 4/3  Vancomycin (empiric, HCAP)> 4/5 4/3  Levofloxacin (empiric, HCAP/CAP)>>>  Anti-infectives     Start     Dose/Rate Route Frequency Ordered Stop   11/28/11 2200   ceFEPIme (MAXIPIME) 1 g in dextrose 5 % 50 mL IVPB  Status:  Discontinued        1 g 100 mL/hr over 30 Minutes Intravenous Every 24 hours 11/28/11 0306 11/29/11 1114   11/28/11 2200   vancomycin (VANCOCIN) 750 mg in sodium chloride 0.9 % 150 mL IVPB  Status:  Discontinued        750 mg 150 mL/hr over 60 Minutes Intravenous Every 24 hours 11/28/11 0306 11/29/11 1450   11/28/11 0330   levofloxacin (LEVAQUIN) IVPB 750 mg        750 mg 100 mL/hr over 90 Minutes Intravenous Every 48 hours 11/28/11 0305 12/04/11 0329   11/28/11 0030   ceFEPIme (MAXIPIME) 1 g in dextrose 5 % 50 mL IVPB  Status:  Discontinued        1 g 100 mL/hr over 30 Minutes Intravenous 3 times per day 11/28/11 0024 11/28/11 0306   11/28/11 0030   levofloxacin (LEVAQUIN) IVPB 750 mg  Status:  Discontinued  750 mg 100 mL/hr over 90 Minutes Intravenous Every 24 hours 11/28/11 0024 11/28/11 0305   11/28/11 0030   vancomycin (VANCOCIN) IVPB 1000 mg/200 mL premix        1,000 mg 200 mL/hr over 60 Minutes Intravenous  Once 11/28/11 0030 11/28/11 0253           Tests / Events: 4/3  CXR >>>  Pulmonary vascular congestion, possible LLL infiltrate 4/4 CXR>>>Appliances positioned as described and apparent satisfactory location. Persistent cardiac enlargement with pulmonary vascular congestion and interstitial edema.    SUBJECTIVE/OVERNIGHT/INTERVAL HX A Fib RVR overnight and lopressor started GOt some sedation meds last night  Currently RASS -1 but periodically +2 and appears to follow commands SBT started   Vital Signs: Filed Vitals:   11/30/11 0600 11/30/11 0700 11/30/11 0800 11/30/11 0914  BP: 120/67 127/58    Pulse: 59 64    Temp:   97.8 F (36.6 C)   TempSrc:   Axillary   Resp: 20 19    Height:      Weight:      SpO2: 98% 99%  98%  FIO2 0.30    Intake/Output Summary (Last 24 hours) at 11/30/11 1230 Last data filed at 11/30/11 0700  Gross per 24 hour  Intake 1278.58 ml  Output    1690 ml  Net -411.42 ml    Physical Examination: General:  Elderly male on vent Neuro:  Currently RASS -1 but periodically +2 and appears to follow commands; this on continous sedation HEENT:  OETT in place, mm pink /moist Neck:  JVD noted Cardiovascular:  Irregular rhythm, distant heart sounds Lungs:  Even/non-labored on vent, lungs bilaterally coarse Abdomen:  Soft, nontender, nondistended, bowel sounds present Musculoskeletal:  Moves all extremities, no pedal edema Skin:  No rash  Ventilator settings: Vent Mode:  [-] PRVC FiO2 (%):  [30 %] 30 % Set Rate:  [16 bmp] 16 bmp Vt Set:  [580 mL] 580 mL PEEP:  [5 cmH20] 5 cmH20 Plateau Pressure:  [13 cmH20-16 cmH20] 14 cmH20  Labs and Imaging:  Reviewed.  Please refer to the Assessment and Plan section for relevant results.  ASSESSMENT AND PLAN  NEUROLOGIC A:  Encephalopathy secondary to sedation. On 11/30/11 unclear if improved but sedation gtt impairing SBT asessement P: --> DC  Versed / fentanyl gtt and change to prn. Goal RASS 0 to -1 -->  Daily WUA  PULMONARY  Lab 11/28/11 1720 11/28/11 0431 11/28/11 0223 11/27/11 2249  PHART 7.425 7.259* 7.158* 7.410  PCO2ART 33.4* 42.5 49.9* 27.6*  PO2ART 87.5 295.0* 216.0* 77.3*  HCO3 21.5 18.4* 17.0* 17.2*  O2SAT 97.7 99.8 98.6 95.0   A:  Acute hypoxemic / hypercarbic respiratory failure. Metabolic acidosis. Acute pulmonary edema, less likely acute COPD exacerbation or HCAP.  Pulmonary hypertension. P: -->  Daily SBT; do off sedation gtt. Aim to extubate 11/30/11 or 12/01/11 -->  Follow up CXR -->  Bronchodilators via nebulizer -->  Solu-Medrol 80 mg IV q8h -->  Hold inhalers -->  Abx / cultures per ID section -->narrow abx 4/5  CARDIOVASCULAR  Lab 11/28/11 1451 11/28/11 0829 11/28/11 0400 11/27/11 2242 11/27/11 2241  TROPONINI <0.30 <0.30 <0.30 -- --  LATICACIDVEN -- -- -- 4.1* --  PROBNP -- -- -- -- 13987.0*   A:  Acute CHF (diastolic) exacerbation with acute pulmonary edema.   Atrial fibrillation / controlled rate.  History of HTN.  Initially hypotensive, now resolved.  Severe TR.   - on 11/30/11: A Fib RVR 130  after lopressors and cardizem held. Metoprolol restarted b ut still A Fib RVR  -    Lab 11/28/11 1451 11/28/11 0829 11/28/11 0400  TROPONINI <0.30 <0.30 <0.30    P: -->  Lasix 40 mg IV q12h -->  ASA -->  Start cardizem gtt 11/30/2011  Hold PO Metoprolol Cardizem, Hydralazine   RENAL  Lab 11/30/11 0400 11/29/11 0400 11/28/11 0400 11/27/11 2241  NA 143 140 139 137  K 3.1* 3.7 -- --  CL 106 104 105 102  CO2 26 23 21 21   BUN 64* 55* 54* 52*  CREATININE 2.34* 2.41* 2.79* 2.56*  CALCIUM 8.9 8.7 8.2* 8.7  MG -- -- -- --  PHOS -- -- -- --   A:  Acute on chronic renal failure / acute kidney injury.  P:  - replete K  -->  BMP in AM  GASTROINTESTINAL  Lab 11/28/11 0400 11/27/11 2241  AST 49* 40*  ALT 39 32  ALKPHOS 87 91  BILITOT 0.5 0.6  PROT 6.7 7.4  ALBUMIN 3.0* 3.4*   A:  Mild transaminitis, possibly secondary to alcohol use.  Malnutrition. P: -->  GI Px -->  TF   HEMATOLOGIC  Lab 11/30/11 0400 11/29/11 0400 11/28/11 0400 11/27/11 2241  HGB 9.3* 9.0* 9.1* 9.9*  HCT 29.6* 28.1* 29.2* 32.0*  PLT 201 166 190 229  INR -- -- 1.38 --  APTT -- -- -- --   A:  Chronic stable anemia, likely of chronic disease. P: -->  CBC in AM -->  DVT Px  INFECTIOUS  Lab 11/30/11 0400 11/29/11 0400 11/28/11 0400 11/27/11 2241  WBC 19.2* 11.3* 15.9* 14.2*  PROCALCITON -- -- -- 0.12   A:  Suspected HCAP, but PCT level speaks against it. P: -->  Narrow abx to Levofloxacin 4/5  -->  Cultures as per dashboard  ENDOCRINE  Lab 11/30/11 1207 11/30/11 0805 11/30/11 0426 11/30/11 0344 11/30/11 0019  GLUCAP 118* 123* 168* 143* 127*   A:  No active issues.  Risk of steroid-induced hyperglycemia. P: -->  CBG checks / SSI   BEST PRACTICE / DISPOSITION -->  ICU status under PCCM -->  Full code -->  NPO -->  Heparin [place] for DVT Px -->  Protonix IV  for GI Px -->  Ventilator bundle -->  Family is not available  The patient is critically ill with multiple organ systems failure and requires high complexity decision making for assessment and support, frequent evaluation and titration of therapies, application of advanced monitoring technologies and extensive interpretation of multiple databases.   Critical Care Time devoted to patient care services described in this note is  35  Minutes.  Dr. Kalman Shan, M.D., [facility].C.P Pulmonary and Critical Care Medicine Staff Physician [place] [facility] [place] [facility] Pager: [phone], If no answer or between  15:00h - 7:00h: call [phone]  11/30/2011 12:37 PM

## 2011-11-30 NOTE — Progress Notes (Signed)
eLink Physician-Brief Progress Note Patient Name: [name] DOB: Sep 12, 1935 MRN: [id number]  Date of Service  11/30/2011   HPI/Events of Note   afib with rates in to 130's   eICU Interventions  Lopressor 5 mg IV x 1 Start lopressor 25 mg q6 hours liquid via OG tube   Intervention Category Major Interventions: Arrhythmia - evaluation and management  Leeanna Slaby 11/30/2011, 4:16 AM

## 2011-11-30 NOTE — Plan of Care (Signed)
Problem: Phase I Progression Outcomes Goal: VTE prophylaxis Outcome: Completed/Met Date Met:  11/30/11 On heparin injectable Goal: Sedation Protocol initiated if indicated Outcome: Completed/Met Date Met:  11/30/11 Fentanyl, versed: changed to prn on 4/6 Goal: Patient tolerating nututrition at goal Outcome: Completed/Met Date Met:  11/30/11 Tube feedings at goal, no residual Goal: Voiding-avoid urinary catheter unless indicated Outcome: Not Applicable Date Met:  11/30/11 Foley indicated

## 2011-12-01 DIAGNOSIS — I4891 Unspecified atrial fibrillation: Secondary | ICD-10-CM

## 2011-12-01 DIAGNOSIS — J9600 Acute respiratory failure, unspecified whether with hypoxia or hypercapnia: Secondary | ICD-10-CM

## 2011-12-01 LAB — BASIC METABOLIC PANEL
Calcium: 9 mg/dL (ref 8.4–10.5)
Creatinine, Ser: 2.08 mg/dL — ABNORMAL HIGH (ref 0.50–1.35)
GFR calc non Af Amer: 30 mL/min — ABNORMAL LOW (ref >90)
Glucose, Bld: 116 mg/dL — ABNORMAL HIGH (ref 70–99)
Sodium: 146 mEq/L — ABNORMAL HIGH (ref 135–145)

## 2011-12-01 LAB — GLUCOSE, CAPILLARY
Glucose-Capillary: 143 mg/dL — ABNORMAL HIGH (ref 70–99)
Glucose-Capillary: 128 mg/dL — ABNORMAL HIGH (ref 70–99)

## 2011-12-01 LAB — PHOSPHORUS: Phosphorus: 3.4 mg/dL (ref 2.3–4.6)

## 2011-12-01 MED ORDER — POTASSIUM CHLORIDE 20 MEQ/15ML (10%) PO LIQD
40.0000 meq | Freq: Once | ORAL | Status: AC
  Administered 2011-12-01: 40 meq
  Filled 2011-12-01: qty 30

## 2011-12-01 MED ORDER — SODIUM CHLORIDE 3 % IN NEBU
INHALATION_SOLUTION | RESPIRATORY_TRACT | Status: AC
  Filled 2011-12-01: qty 15

## 2011-12-01 MED ORDER — FUROSEMIDE 10 MG/ML IJ SOLN
40.0000 mg | Freq: Three times a day (TID) | INTRAMUSCULAR | Status: DC
  Administered 2011-12-01 – 2011-12-03 (×6): 40 mg via INTRAVENOUS
  Filled 2011-12-01 (×9): qty 4

## 2011-12-01 MED ORDER — METHYLPREDNISOLONE SODIUM SUCC 40 MG IJ SOLR
40.0000 mg | Freq: Three times a day (TID) | INTRAMUSCULAR | Status: DC
  Administered 2011-12-01 – 2011-12-03 (×6): 40 mg via INTRAVENOUS
  Filled 2011-12-01 (×9): qty 1

## 2011-12-01 MED ORDER — METOPROLOL TARTRATE 25 MG PO TABS
25.0000 mg | ORAL_TABLET | Freq: Two times a day (BID) | ORAL | Status: DC
  Administered 2011-12-01 – 2011-12-03 (×4): 25 mg via ORAL
  Filled 2011-12-01 (×5): qty 1

## 2011-12-01 NOTE — Progress Notes (Signed)
Extubated pt per MD order, placed on 3L [place], tolerated procedure well.  Pox 98%, HR 119, RR 16, BP 115/69, pt able to speak.

## 2011-12-01 NOTE — Progress Notes (Signed)
eLink Physician-Brief Progress Note Patient Name: [name] DOB: 07/18/1936 MRN: [id number]  Date of Service  12/01/2011   HPI/Events of Note   Diet  eICU Interventions  Ordered   Intervention Category Minor Interventions: Routine modifications to care plan (e.g. PRN medications for pain, fever)  [name] J 12/01/2011, 4:09 PM

## 2011-12-01 NOTE — Progress Notes (Addendum)
Name: [name] MRN: [id number] DOB: 1935-10-24    LOS: 4  [place] [facility]      Brief patient profile:  76 yo with GOLD stage IV COPD (on home oxygen 2 lpm), diastolic CHF (EF 24%), pulmonary hypertension, severe TR and recent hopspital admission brought to [facility] ED am 4/4 in respiratory distress likely secondary to acute pulmonary edema. Failed NIMV / BiPAP.  Intubated.  Lines / Drains: 4/3  Foley ETT tube ? Date >> (planned extubation 12/01/11)  Cultures: 4/3  Blood>>> 4/3 U. Strep>>>neg 4/4 U Legionella > neg  4/4 MRSA > neg 4/5 Trach asp >>>  Recent Results (from the past 240 hour(s))  CULTURE, BLOOD (ROUTINE X 2)     Status: Normal (Preliminary result)   Collection Time   11/28/11  4:00 AM      Component Value Range Status Comment   Specimen Description BLOOD NO SITE GIVEN   Final    Special Requests BOTTLES DRAWN AEROBIC AND ANAEROBIC   Final    Culture  Setup Time [id number]   Final    Culture     Final    Value:        BLOOD CULTURE RECEIVED NO GROWTH TO DATE CULTURE WILL BE HELD FOR 5 DAYS BEFORE ISSUING A FINAL NEGATIVE REPORT   Report Status PENDING   Incomplete   CULTURE, BLOOD (ROUTINE X 2)     Status: Normal (Preliminary result)   Collection Time   11/28/11  4:12 AM      Component Value Range Status Comment   Specimen Description BLOOD LEFT ARM   Final    Special Requests BOTTLES DRAWN AEROBIC ONLY [facility]   Final    Culture  Setup Time [id number]   Final    Culture     Final    Value:        BLOOD CULTURE RECEIVED NO GROWTH TO DATE CULTURE WILL BE HELD FOR 5 DAYS BEFORE ISSUING A FINAL NEGATIVE REPORT   Report Status PENDING   Incomplete   MRSA PCR SCREENING     Status: Normal   Collection Time   11/28/11  4:26 AM      Component Value Range Status Comment   MRSA by PCR NEGATIVE  NEGATIVE  Final      Antibiotics: 4/3  Cefepime (empiric, HCAP)>>>4/5 4/3  Vancomycin (empiric, HCAP)> 4/5 4/3  Levofloxacin (empiric, HCAP/CAP)>>>  Anti-infectives     Start      Dose/Rate Route Frequency Ordered Stop   11/28/11 2200   ceFEPIme (MAXIPIME) 1 g in dextrose 5 % 50 mL IVPB  Status:  Discontinued        1 g 100 mL/hr over 30 Minutes Intravenous Every 24 hours 11/28/11 0306 11/29/11 1114   11/28/11 2200   vancomycin (VANCOCIN) 750 mg in sodium chloride 0.9 % 150 mL IVPB  Status:  Discontinued        750 mg 150 mL/hr over 60 Minutes Intravenous Every 24 hours 11/28/11 0306 11/29/11 1450   11/28/11 0330   levofloxacin (LEVAQUIN) IVPB 750 mg        750 mg 100 mL/hr over 90 Minutes Intravenous Every 48 hours 11/28/11 0305 12/04/11 0329   11/28/11 0030   ceFEPIme (MAXIPIME) 1 g in dextrose 5 % 50 mL IVPB  Status:  Discontinued        1 g 100 mL/hr over 30 Minutes Intravenous 3 times per day 11/28/11 0024 11/28/11 0306   11/28/11 0030   levofloxacin (  LEVAQUIN) IVPB 750 mg  Status:  Discontinued        750 mg 100 mL/hr over 90 Minutes Intravenous Every 24 hours 11/28/11 0024 11/28/11 0305   11/28/11 0030   vancomycin (VANCOCIN) IVPB 1000 mg/200 mL premix        1,000 mg 200 mL/hr over 60 Minutes Intravenous  Once 11/28/11 0030 11/28/11 0253           Tests / Events: 4/3  CXR >>>  Pulmonary vascular congestion, possible LLL infiltrate 4/4 CXR>>>Appliances positioned as described and apparent satisfactory location. Persistent cardiac enlargement with pulmonary vascular congestion and interstitial edema.    SUBJECTIVE/OVERNIGHT/INTERVAL HX SBT doing well on 5/5 and meets extubation criteria On cardizem gtt and HR 120 No complaints. Desiring extubation  Vital Signs: Filed Vitals:   12/01/11 0531 12/01/11 0800 12/01/11 0802 12/01/11 1014  BP: 125/84 120/79  124/72  Pulse: 106 81  114  Temp:  97.6 F (36.4 C)    TempSrc:  Axillary    Resp: 16 18  21   Height:      Weight:      SpO2: 98% 99% 99% 98%  FIO2 0.30    Intake/Output Summary (Last 24 hours) at 12/01/11 1133 Last data filed at 12/01/11 0900  Gross per 24 hour  Intake   1670 ml    Output   1475 ml  Net    195 ml    Physical Examination: General:  Elderly male on vent Neuro:  Currently RASS  0 and watching TV and wants extubtion. CAM-ICU neg for delirium HEENT:  OETT in place, mm pink /moist Neck:  JVD noted Cardiovascular:  Irregular rhythm, distant heart sounds Lungs:  Even/non-labored on vent, lungs bilaterally coarse Abdomen:  Soft, nontender, nondistended, bowel sounds present Musculoskeletal:  Moves all extremities, no pedal edema Skin:  No rash  Ventilator settings: Vent Mode:  [-] PSV FiO2 (%):  [30 %] 30 % Set Rate:  [16 bmp] 16 bmp Vt Set:  [580 mL] 580 mL PEEP:  [5 cmH20] 5 cmH20 Pressure Support:  [5 cmH20-8 cmH20] 5 cmH20 Plateau Pressure:  [13 cmH20-17 cmH20] 17 cmH20  Labs and Imaging:  Reviewed.  Please refer to the Assessment and Plan section for relevant results.  No results found.   ASSESSMENT AND PLAN  NEUROLOGIC A:  Encephalopathy secondary to sedation. On 12/01/11 normal mental status P: --> sedation prn. Goal RASS 0 to -1  PULMONARY  Lab 11/28/11 1720 11/28/11 0431 11/28/11 0223 11/27/11 2249  PHART 7.425 7.259* 7.158* 7.410  PCO2ART 33.4* 42.5 49.9* 27.6*  PO2ART 87.5 295.0* 216.0* 77.3*  HCO3 21.5 18.4* 17.0* 17.2*  O2SAT 97.7 99.8 98.6 95.0   A:  Acute hypoxemic / hypercarbic respiratory failure. Metabolic acidosis. Acute pulmonary edema, less likely acute COPD exacerbation or HCAP.  Pulmonary hypertension. Meets extubation criteria 12/01/11 P: -->  Aim to extubate 12/01/11 -->  Follow up CXR periodically -->  Bronchodilators via nebulizer -->  Solu-Medrol 80 mg IV q8h -->  Hold inhalers -->  Abx / cultures per ID section -->narrow abx 4/5  CARDIOVASCULAR  Lab 11/28/11 1451 11/28/11 0829 11/28/11 0400 11/27/11 2242 11/27/11 2241  TROPONINI <0.30 <0.30 <0.30 -- --  LATICACIDVEN -- -- -- 4.1* --  PROBNP -- -- -- -- 13987.0*   A:  Acute CHF (diastolic) exacerbation with acute pulmonary edema.  Atrial fibrillation /  controlled rate.  History of HTN.  Initially hypotensive, now resolved.  Severe TR.   - on 11/30/11: A  Fib RVR 130 after lopressors and cardizem held. Metoprolol po restarted b ut still A Fib RVR and changed to cardizem gtt  - on 12/01/11: HR 110 on cardizem gtt and normal bP  -     Lab 11/28/11 1451 11/28/11 0829 11/28/11 0400  TROPONINI <0.30 <0.30 <0.30    P: -->  Lasix 40 mg IV q12h -->  ASA -->  Continue  cardizem gtt since 11/30/11  And po metorprol -> Hold POCardizem, Hydralazine \  - check bnp and cxr 12/02/11 - might need cards consult  RENAL  Lab 12/01/11 0330 11/30/11 0400 11/29/11 0400 11/28/11 0400 11/27/11 2241  NA 146* 143 140 139 137  K 3.6 3.1* -- -- --  CL 107 106 104 105 102  CO2 29 26 23 21 21   BUN 66* 64* 55* 54* 52*  CREATININE 2.08* 2.34* 2.41* 2.79* 2.56*  CALCIUM 9.0 8.9 8.7 8.2* 8.7  MG 2.5 -- -- -- --  PHOS 3.4 -- -- -- --   A:  Acute on chronic renal failure / acute kidney injury.  P:  - replete K  -->  BMP in AM  GASTROINTESTINAL  Lab 11/28/11 0400 11/27/11 2241  AST 49* 40*  ALT 39 32  ALKPHOS 87 91  BILITOT 0.5 0.6  PROT 6.7 7.4  ALBUMIN 3.0* 3.4*   A:  Mild transaminitis, possibly secondary to alcohol use.  Malnutrition. P: -->  GI Px -->  TF   HEMATOLOGIC  Lab 11/30/11 0400 11/29/11 0400 11/28/11 0400 11/27/11 2241  HGB 9.3* 9.0* 9.1* 9.9*  HCT 29.6* 28.1* 29.2* 32.0*  PLT 201 166 190 229  INR -- -- 1.38 --  APTT -- -- -- --   A:  Chronic stable anemia, likely of chronic disease. P: -->  PRBC for hgb < 7gm%  -->  DVT Px -   INFECTIOUS  Lab 11/30/11 0400 11/29/11 0400 11/28/11 0400 11/27/11 2241  WBC 19.2* 11.3* 15.9* 14.2*  PROCALCITON -- -- -- 0.12   A:  Suspected HCAP, but PCT level speaks against it. P: -->  Narrow abx to Levofloxacin 4/5  -->  Cultures as per dashboard  ENDOCRINE  Lab 12/01/11 0337 11/30/11 2350 11/30/11 2010 11/30/11 1608 11/30/11 1207  GLUCAP 120* 129* 111* 151* 118*   A:  No active  issues.  Risk of steroid-induced hyperglycemia. P: -->  CBG checks / SSI   BEST PRACTICE / DISPOSITION -->  ICU status under PCCM -->  Full code -->  NPO -->  Heparin [place] for DVT Px -->  Protonix IV for GI Px -->  Ventilator bundle -->  Family is not available  The patient is critically ill with multiple organ systems failure and requires high complexity decision making for assessment and support, frequent evaluation and titration of therapies, application of advanced monitoring technologies and extensive interpretation of multiple databases.   Critical Care Time devoted to patient care services described in this note is  35  Minutes.  Dr. Kalman Shan, M.D., [facility].C.P Pulmonary and Critical Care Medicine Staff Physician [facility] [place] [facility] and Critical Care Pager: [phone], If no answer or between  15:00h - 7:00h: call [phone]  12/01/2011 11:33 AM

## 2011-12-02 ENCOUNTER — Other Ambulatory Visit: Payer: Self-pay | Admitting: *Deleted

## 2011-12-02 ENCOUNTER — Inpatient Hospital Stay (HOSPITAL_COMMUNITY): Payer: Medicare Other

## 2011-12-02 DIAGNOSIS — I714 Abdominal aortic aneurysm, without rupture: Secondary | ICD-10-CM

## 2011-12-02 LAB — COMPREHENSIVE METABOLIC PANEL
Albumin: 2.7 g/dL — ABNORMAL LOW (ref 3.5–5.2)
BUN: 73 mg/dL — ABNORMAL HIGH (ref 6–23)
Calcium: 8.9 mg/dL (ref 8.4–10.5)
Creatinine, Ser: 2.3 mg/dL — ABNORMAL HIGH (ref 0.50–1.35)
GFR calc Af Amer: 30 mL/min — ABNORMAL LOW (ref >90)
Total Protein: 6.2 g/dL (ref 6.0–8.3)

## 2011-12-02 LAB — CBC
HCT: 28.7 % — ABNORMAL LOW (ref 39.0–52.0)
MCV: 100.7 fL — ABNORMAL HIGH (ref 78.0–100.0)
RBC: 2.85 MIL/uL — ABNORMAL LOW (ref 4.22–5.81)
WBC: 14.4 10*3/uL — ABNORMAL HIGH (ref 4.0–10.5)

## 2011-12-02 MED ORDER — DILTIAZEM HCL ER 60 MG PO CP12
120.0000 mg | ORAL_CAPSULE | Freq: Two times a day (BID) | ORAL | Status: DC
  Administered 2011-12-02 – 2011-12-04 (×6): 120 mg via ORAL
  Filled 2011-12-02 (×8): qty 2

## 2011-12-02 NOTE — Progress Notes (Signed)
Name: [name] MRN: [id number] DOB: 11/28/1935    LOS: 5  [place] [facility]      Brief patient profile:  76 yo with GOLD stage IV COPD (on home oxygen 2 lpm), diastolic CHF (EF 08%), pulmonary hypertension, severe TR and recent hopspital admission brought to [facility] ED am 4/4 in respiratory distress likely secondary to acute pulmonary edema. Failed NIMV / BiPAP.  Intubated.  Lines / Drains: 4/3  Foley ETT tube ? Date >>12/01/11)  Cultures: 4/3  Blood>>>ng 4/3 U. Strep>>>neg 4/4 U Legionella > neg  4/4 MRSA > neg 4/5 Trach asp >>>   Antibiotics: 4/3  Cefepime (empiric, HCAP)>>>4/5 4/3  Vancomycin (empiric, HCAP)> 4/5 4/3  Levofloxacin (empiric, HCAP/CAP)>>>  Tests / Events: 4/3  CXR >>>  Pulmonary vascular congestion, possible LLL infiltrate 4/4 CXR>>>Appliances positioned as described and apparent satisfactory location. Persistent cardiac enlargement with pulmonary vascular congestion and interstitial edema.    SUBJECTIVE/OVERNIGHT/INTERVAL HX NAD at rest HR 135  Vital Signs: Filed Vitals:   12/02/11 0700 12/02/11 0759 12/02/11 0800 12/02/11 0900  BP: 119/98  104/74   Pulse: 109  60 110  Temp:  97.6 F (36.4 C)    TempSrc:  Oral    Resp: 20  16 18   Height:      Weight:      SpO2: 96%  96% 95%  FIO2 0.30    Intake/Output Summary (Last 24 hours) at 12/02/11 1019 Last data filed at 12/02/11 0933  Gross per 24 hour  Intake    833 ml  Output   1245 ml  Net   -412 ml    Physical Examination: General:  Elderly male , awake and alert Neuro:  intact t Neck: No  JVD noted Cardiovascular:  Irregular rhythm, distant heart sounds , Cardizem drip at 15  Lungs:  Even/non-labored t, lungs bilaterally coarse Abdomen:  Soft, nontender, nondistended, bowel sounds present, eating Musculoskeletal:  Moves all extremities, no pedal edema Skin:  No rash  Ventilator settings:    Labs and Imaging:  Reviewed.  Please refer to the Assessment and Plan section for relevant  results.  Dg Chest Port 1 View  12/02/2011  *RADIOLOGY REPORT*  Clinical Data: Respiratory failure.  PORTABLE CHEST - 1 VIEW  Comparison: Chest x-ray 11/29/2011.  Findings: The patient has been extubated and previously noted nasogastric tube has been removed.  Previously noted right subclavian central venous catheter is similarly positioned with tip in the mid superior vena cava.  Lung volumes remain low.  There continues be bibasilar opacities which are favored to represent areas of low atelectasis.  Small bilateral pleural effusions are unchanged.  Pulmonary venous congestion without frank pulmonary edema.  Heart size is mild - moderately enlarged (unchanged). The patient is rotated to the right on today's exam, resulting in distortion of the mediastinal contours and reduced diagnostic sensitivity and specificity for mediastinal pathology. Atherosclerotic calcifications within the arch of the aorta.  IMPRESSION: 1.  Support apparatus, as above. 2.  Low lung volumes with persistent bibasilar opacities favored to represent areas of atelectasis, and small bilateral pleural effusions. 3.  Mild - moderate cardiomegaly is unchanged. 4.  Atherosclerosis.  Original Report Authenticated By: Florencia Reasons, M.D.     ASSESSMENT AND PLAN  NEUROLOGIC A:  Encephalopathy secondary to sedation. On 12/01/11 normal mental status P: -4/8 resolved  PULMONARY  Lab 11/28/11 1720 11/28/11 0431 11/28/11 0223 11/27/11 2249  PHART 7.425 7.259* 7.158* 7.410  PCO2ART 33.4* 42.5 49.9* 27.6*  PO2ART  87.5 295.0* 216.0* 77.3*  HCO3 21.5 18.4* 17.0* 17.2*  O2SAT 97.7 99.8 98.6 95.0   A:  Acute hypoxemic / hypercarbic respiratory failure. Metabolic acidosis. Acute pulmonary edema, less likely acute COPD exacerbation or HCAP.  Pulmonary hypertension. Meets extubation criteria 12/01/11 P: -->  extubated 12/01/11 -->  Follow up CXR periodically -->  Bronchodilators via nebulizer -->  Solu-Medrol 40 mg IV q8h -->  Hold  inhalers -->  narrowed abx 4/5 to levaquin  CARDIOVASCULAR  Lab 12/02/11 0600 11/28/11 1451 11/28/11 0829 11/28/11 0400 11/27/11 2242 11/27/11 2241  TROPONINI -- <0.30 <0.30 <0.30 -- --  LATICACIDVEN -- -- -- -- 4.1* --  PROBNP 10092.0* -- -- -- -- 13987.0*   A:  Acute CHF (diastolic) exacerbation with acute pulmonary edema.  Atrial fibrillation / controlled rate.  History of HTN.  Initially hypotensive, now resolved.  Severe TR.   - on 11/30/11: A Fib RVR 130 after lopressors and cardizem held. Metoprolol po restarted b ut still A Fib RVR and changed to cardizem gtt  - on 12/01/11: HR 110 on cardizem gtt and normal bP     Lab 11/28/11 1451 11/28/11 0829 11/28/11 0400  TROPONINI <0.30 <0.30 <0.30    P: -->  Lasix 40 mg IV q12h -->  ASA --> - 4/8 hr 135 on 15 Cardizem, 4/8 change to po cardizem and add low dose bb  - check bnp and cxr 12/03/11 - might need cards consult in near future  RENAL  Lab 12/02/11 0600 12/01/11 0330 11/30/11 0400 11/29/11 0400 11/28/11 0400  NA 146* 146* 143 140 139  K 3.9 3.6 -- -- --  CL 106 107 106 104 105  CO2 30 29 26 23 21   BUN 73* 66* 64* 55* 54*  CREATININE 2.30* 2.08* 2.34* 2.41* 2.79*  CALCIUM 8.9 9.0 8.9 8.7 8.2*  MG -- 2.5 -- -- --  PHOS -- 3.4 -- -- --   A:  Acute on chronic renal failure / acute kidney injury.  P:  - replete K  -->  BMP in AM  GASTROINTESTINAL  Lab 12/02/11 0600 11/28/11 0400 11/27/11 2241  AST 22 49* 40*  ALT 27 39 32  ALKPHOS 66 87 91  BILITOT 0.5 0.5 0.6  PROT 6.2 6.7 7.4  ALBUMIN 2.7* 3.0* 3.4*   A:  Mild transaminitis, possibly secondary to alcohol use.  Malnutrition. P: -->  GI Px -->  TF   HEMATOLOGIC  Lab 12/02/11 0600 11/30/11 0400 11/29/11 0400 11/28/11 0400 11/27/11 2241  HGB 8.7* 9.3* 9.0* 9.1* 9.9*  HCT 28.7* 29.6* 28.1* 29.2* 32.0*  PLT 175 201 166 190 229  INR -- -- -- 1.38 --  APTT -- -- -- -- --   A:  Chronic stable anemia, likely of chronic disease. P: -->  PRBC for hgb < 7gm%   -->  DVT Px -   INFECTIOUS  Lab 12/02/11 0600 11/30/11 0400 11/29/11 0400 11/28/11 0400 11/27/11 2241  WBC 14.4* 19.2* 11.3* 15.9* 14.2*  PROCALCITON -- -- -- -- 0.12   A:  Suspected HCAP, but PCT level speaks against it. P: -->  Narrow abx to Levofloxacin 4/5  -->  Cultures as per dashboard  ENDOCRINE  Lab 12/01/11 1538 12/01/11 1210 12/01/11 0804 12/01/11 0337 11/30/11 2350  GLUCAP 128* 116* 143* 120* 129*   A:  No active issues.  Risk of steroid-induced hyperglycemia. P: -->  CBG checks / SSI   BEST PRACTICE / DISPOSITION -->  sdU status under PCCM -->  Full code -->  NPO -->  Heparin [place] for DVT Px -->  Protonix IV for GI Px -->  Family is not available  Brett Canales Minor ACNP [facility] Pager [phone] till 3 pm If no answer page [phone] 12/02/2011, 10:19 AM  Independently examined pt, evaluated data & formulated above care plan with NP Lera Gaines V.  12/02/2011 10:19 AM

## 2011-12-02 NOTE — Progress Notes (Signed)
CARE MANAGEMENT NOTE 12/02/2011  Patient:  LELEND, HEINECKE   Account Number:  [id number]  Date Initiated:  11/28/2011  Documentation initiated by:  Paizleigh Wilds  Subjective/Objective Assessment:   pt with hx of a.fib with rvr just recently dc to home retruned with resp distress ,failed attempt for bipap required intubation     Action/Plan:   lives at home alone does have friend and family that check in on him   Anticipated DC Date:  12/05/2011   Anticipated DC Plan:  HOME W HOME HEALTH SERVICES         Choice offered to / List presented to:             Status of service:  In process, will continue to follow Medicare Important Message given?   (If response is "NO", the following Medicare IM given date fields will be blank) Date Medicare IM given:   Date Additional Medicare IM given:    Discharge Disposition:    Per UR Regulation:  Reviewed for med. necessity/level of care/duration of stay  If discussed at Long Length of Stay Meetings, dates discussed:    Comments:  [id number]/Denitra Donaghey,RN,BSN,CCM

## 2011-12-03 ENCOUNTER — Inpatient Hospital Stay (HOSPITAL_COMMUNITY): Payer: Medicare Other

## 2011-12-03 LAB — BASIC METABOLIC PANEL
BUN: 68 mg/dL — ABNORMAL HIGH (ref 6–23)
CO2: 30 mEq/L (ref 19–32)
Calcium: 8.5 mg/dL (ref 8.4–10.5)
Creatinine, Ser: 2 mg/dL — ABNORMAL HIGH (ref 0.50–1.35)
Glucose, Bld: 123 mg/dL — ABNORMAL HIGH (ref 70–99)

## 2011-12-03 LAB — CBC
MCH: 30.5 pg (ref 26.0–34.0)
MCV: 100.7 fL — ABNORMAL HIGH (ref 78.0–100.0)
Platelets: 173 10*3/uL (ref 150–400)
RDW: 18.4 % — ABNORMAL HIGH (ref 11.5–15.5)

## 2011-12-03 LAB — CULTURE, RESPIRATORY W GRAM STAIN: Culture: NORMAL

## 2011-12-03 MED ORDER — DIGOXIN 0.25 MG/ML IJ SOLN
0.2500 mg | Freq: Once | INTRAMUSCULAR | Status: AC
  Filled 2011-12-03: qty 1

## 2011-12-03 MED ORDER — METHYLPREDNISOLONE SODIUM SUCC 40 MG IJ SOLR
40.0000 mg | Freq: Two times a day (BID) | INTRAMUSCULAR | Status: DC
  Administered 2011-12-03: 40 mg via INTRAVENOUS

## 2011-12-03 MED ORDER — LEVOFLOXACIN 500 MG PO TABS
500.0000 mg | ORAL_TABLET | Freq: Every day | ORAL | Status: AC
  Administered 2011-12-03 – 2011-12-06 (×4): 500 mg via ORAL
  Filled 2011-12-03 (×4): qty 1

## 2011-12-03 MED ORDER — LEVALBUTEROL HCL 0.63 MG/3ML IN NEBU
0.6300 mg | INHALATION_SOLUTION | RESPIRATORY_TRACT | Status: DC | PRN
  Filled 2011-12-03: qty 3

## 2011-12-03 MED ORDER — METOPROLOL TARTRATE 25 MG PO TABS
25.0000 mg | ORAL_TABLET | Freq: Once | ORAL | Status: AC
  Administered 2011-12-03: 25 mg via ORAL
  Filled 2011-12-03: qty 1

## 2011-12-03 MED ORDER — LEVALBUTEROL HCL 0.63 MG/3ML IN NEBU
0.6300 mg | INHALATION_SOLUTION | Freq: Four times a day (QID) | RESPIRATORY_TRACT | Status: DC
  Administered 2011-12-03: 0.63 mg via RESPIRATORY_TRACT
  Filled 2011-12-03 (×4): qty 3

## 2011-12-03 MED ORDER — METHYLPREDNISOLONE SODIUM SUCC 40 MG IJ SOLR
40.0000 mg | INTRAMUSCULAR | Status: AC
  Administered 2011-12-04: 40 mg via INTRAVENOUS
  Filled 2011-12-03: qty 1

## 2011-12-03 MED ORDER — LEVALBUTEROL HCL 0.63 MG/3ML IN NEBU
0.6300 mg | INHALATION_SOLUTION | Freq: Four times a day (QID) | RESPIRATORY_TRACT | Status: DC
  Administered 2011-12-03 – 2011-12-05 (×7): 0.63 mg via RESPIRATORY_TRACT
  Filled 2011-12-03 (×11): qty 3

## 2011-12-03 MED ORDER — DIGOXIN 125 MCG PO TABS
0.1250 mg | ORAL_TABLET | Freq: Every day | ORAL | Status: DC
  Administered 2011-12-04: 0.125 mg via ORAL
  Filled 2011-12-03 (×2): qty 1

## 2011-12-03 MED ORDER — METOPROLOL TARTRATE 50 MG PO TABS
50.0000 mg | ORAL_TABLET | Freq: Two times a day (BID) | ORAL | Status: DC
  Administered 2011-12-03 – 2011-12-05 (×5): 50 mg via ORAL
  Filled 2011-12-03 (×7): qty 1

## 2011-12-03 MED ORDER — IPRATROPIUM BROMIDE 0.02 % IN SOLN
0.5000 mg | Freq: Four times a day (QID) | RESPIRATORY_TRACT | Status: DC
  Administered 2011-12-03 – 2011-12-05 (×7): 0.5 mg via RESPIRATORY_TRACT
  Filled 2011-12-03 (×8): qty 2.5

## 2011-12-03 MED ORDER — POTASSIUM CHLORIDE CRYS ER 20 MEQ PO TBCR
40.0000 meq | EXTENDED_RELEASE_TABLET | ORAL | Status: AC
  Administered 2011-12-03 (×2): 40 meq via ORAL
  Filled 2011-12-03 (×2): qty 2

## 2011-12-03 MED ORDER — DIGOXIN 0.25 MG/ML IJ SOLN
0.2500 mg | Freq: Once | INTRAMUSCULAR | Status: AC
  Administered 2011-12-03: 0.25 mg via INTRAVENOUS
  Filled 2011-12-03: qty 1

## 2011-12-03 NOTE — Progress Notes (Signed)
CSW met with Pt to complete ADRs for him at his request. Pt has named his god daughter, Shelle Iron as his HCPOA. CSW completed paperwork and placed copy in chart and provided two copies to patient.  CSW also following for possible d/c needs.  Vennie Homans, [place] 12/03/2011 12:19 PM [phone]

## 2011-12-03 NOTE — Progress Notes (Signed)
Name: [name] MRN: [id number] DOB: [birth year]    LOS: 6  [place] [facility]      Brief patient profile:  76 yo with GOLD stage IV COPD (on home oxygen 2 lpm), diastolic CHF (EF 40%), pulmonary hypertension, severe TR and recent hopspital admission brought to [facility] ED am 4/4 in respiratory distress likely secondary to acute pulmonary edema. Failed NIMV / BiPAP.  Intubated.  Lines / Drains: 4/3  Foley ETT tube 4/4 >>12/01/11) Rt CVL>> Cultures: 4/3  Blood>>>ng 4/3 U. Strep>>>neg 4/4 U Legionella > neg  4/4 MRSA > neg 4/5 Trach asp >>>nl flora   Antibiotics: 4/3  Cefepime (empiric, HCAP)>>>4/5 4/3  Vancomycin (empiric, HCAP)> 4/5 4/3  Levofloxacin (empiric, HCAP/CAP)>>>  Tests / Events: 4/3  CXR >>>  Pulmonary vascular congestion, possible LLL infiltrate 4/4 CXR>>>Appliances positioned as described and apparent satisfactory location. Persistent cardiac enlargement with pulmonary vascular congestion and interstitial edema.    SUBJECTIVE/OVERNIGHT/INTERVAL HX Awake and alert/ Variable HR with what appears to afib. 102-140 despite dilt drip and po dilt.  Vital Signs: Filed Vitals:   12/03/11 0400 12/03/11 0500 12/03/11 0600 12/03/11 0700  BP: 108/70 116/67 110/68 120/87  Pulse: 120 74 40 99  Temp: 97.6 F (36.4 C)     TempSrc: Oral     Resp: 16 13 20 18   Height:      Weight:  140 lb 6.9 oz (63.7 kg)    SpO2: 95% 100% 100% 93%  FIO2 0.30    Intake/Output Summary (Last 24 hours) at 12/03/11 9811 Last data filed at 12/03/11 0700  Gross per 24 hour  Intake    805 ml  Output   4435 ml  Net  -3630 ml    Physical Examination: General:  Elderly male , awake and alert Neuro:  intact t Neck: No  JVD noted Cardiovascular:  Irregular rhythm, distant heart sounds , Cardizem drip at 15  Lungs:  Even/non-labored t, lungs bilaterally coarse Abdomen:  Soft, nontender, nondistended, bowel sounds present, eating Musculoskeletal:  Moves all extremities, no pedal edema Skin:  No  rash  Ventilator settings:    Labs and Imaging:  Reviewed. .  Dg Chest Port 1 View  12/03/2011  *RADIOLOGY REPORT*  Clinical Data: Status post extubation.  Respiratory distress.  PORTABLE CHEST - 1 VIEW  Comparison: 12/02/2011.  Findings: Unchanged central venous catheter from right-sided approach lying with its tip at the SVC/RA junction.  Cardiomegaly. Right greater than left basilar opacities are stable to slightly improved, with increasing lung volumes.  A portion of the lower lung zone is excluded on the left. No pneumothorax.  IMPRESSION: Slight improvement aeration.  Original Report Authenticated By: Elsie Stain, M.D.   Dg Chest Port 1 View  12/02/2011  *RADIOLOGY REPORT*  Clinical Data: Respiratory failure.  PORTABLE CHEST - 1 VIEW  Comparison: Chest x-ray 11/29/2011.  Findings: The patient has been extubated and previously noted nasogastric tube has been removed.  Previously noted right subclavian central venous catheter is similarly positioned with tip in the mid superior vena cava.  Lung volumes remain low.  There continues be bibasilar opacities which are favored to represent areas of low atelectasis.  Small bilateral pleural effusions are unchanged.  Pulmonary venous congestion without frank pulmonary edema.  Heart size is mild - moderately enlarged (unchanged). The patient is rotated to the right on today's exam, resulting in distortion of the mediastinal contours and reduced diagnostic sensitivity and specificity for mediastinal pathology. Atherosclerotic calcifications within the arch of  the aorta.  IMPRESSION: 1.  Support apparatus, as above. 2.  Low lung volumes with persistent bibasilar opacities favored to represent areas of atelectasis, and small bilateral pleural effusions. 3.  Mild - moderate cardiomegaly is unchanged. 4.  Atherosclerosis.  Original Report Authenticated By: Florencia Reasons, M.D.    Lab 12/03/11 0505 12/02/11 0600 12/01/11 0330  NA 142 146* 146*  K 2.9* 3.9  3.6  CL 101 106 107  CO2 30 30 29   BUN 68* 73* 66*  CREATININE 2.00* 2.30* 2.08*  GLUCOSE 123* 124* 116*    Lab 12/03/11 0505 12/02/11 0600 11/30/11 0400  HGB 8.6* 8.7* 9.3*  HCT 28.4* 28.7* 29.6*  WBC 13.3* 14.4* 19.2*  PLT 173 175 201       ASSESSMENT AND PLAN  NEUROLOGIC A:  Encephalopathy secondary to sedation. On 12/01/11 normal mental status P: -4/8 resolved  PULMONARY  Lab 11/28/11 1720 11/28/11 0431 11/28/11 0223 11/27/11 2249  PHART 7.425 7.259* 7.158* 7.410  PCO2ART 33.4* 42.5 49.9* 27.6*  PO2ART 87.5 295.0* 216.0* 77.3*  HCO3 21.5 18.4* 17.0* 17.2*  O2SAT 97.7 99.8 98.6 95.0   A:  Acute hypoxemic / hypercarbic respiratory failure. Metabolic acidosis. Acute pulmonary edema, less likely acute COPD exacerbation or HCAP.  Pulmonary hypertension. Meets extubation criteria 12/01/11 P: -->  extubated 12/01/11 -->  Follow up CXR periodically -->  Bronchodilators via nebulizer -->  Solu-Medrol 40 mg IV q24h -->  Hold inhalers -->  narrowed abx 4/5 to levaquin -4/8 change to po levaquin with stop date.  CARDIOVASCULAR  Lab 12/02/11 0600 11/28/11 1451 11/28/11 0829 11/28/11 0400 11/27/11 2242 11/27/11 2241  TROPONINI -- <0.30 <0.30 <0.30 -- --  LATICACIDVEN -- -- -- -- 4.1* --  PROBNP 10092.0* -- -- -- -- 13987.0*   A:  Acute CHF (diastolic) exacerbation with acute pulmonary edema.  Atrial fibrillation / controlled rate.  History of HTN.  Initially hypotensive, now resolved.  Severe TR.   - on 11/30/11: A Fib RVR 130 after lopressors and cardizem held. Metoprolol po restarted b ut still A Fib RVR and changed to cardizem gtt  - on 12/01/11: HR 110 on cardizem gtt and normal bP -4/8 po dilt started 4/9 add dig and increase dose of po dilt, stop iv dilt     Lab 11/28/11 1451 11/28/11 0829 11/28/11 0400  TROPONINI <0.30 <0.30 <0.30    P: -->  Lasix 40 mg IV q12h -->  ASA --> - 4/8 hr 135 on 15 Cardizem, 4/8 change to po cardizem and add low dose bb  - check bnp  and cxr 12/03/11  Lab 12/02/11 0600 11/27/11 2241  PROBNP 10092.0* 13987.0*     - might need cards consult in near future  RENAL  Lab 12/03/11 0505 12/02/11 0600 12/01/11 0330 11/30/11 0400 11/29/11 0400  NA 142 146* 146* 143 140  K 2.9* 3.9 -- -- --  CL 101 106 107 106 104  CO2 30 30 29 26 23   BUN 68* 73* 66* 64* 55*  CREATININE 2.00* 2.30* 2.08* 2.34* 2.41*  CALCIUM 8.5 8.9 9.0 8.9 8.7  MG -- -- 2.5 -- --  PHOS -- -- 3.4 -- --   A:  Acute on chronic renal failure / acute kidney injury.  P:  - replete K  -->  BMP in AM  GASTROINTESTINAL  Lab 12/02/11 0600 11/28/11 0400 11/27/11 2241  AST 22 49* 40*  ALT 27 39 32  ALKPHOS 66 87 91  BILITOT 0.5 0.5 0.6  PROT 6.2 6.7 7.4  ALBUMIN 2.7* 3.0* 3.4*   A:  Mild transaminitis, possibly secondary to alcohol use.  Malnutrition. P: -->  GI Px -->  Po diet   HEMATOLOGIC  Lab 12/03/11 0505 12/02/11 0600 11/30/11 0400 11/29/11 0400 11/28/11 0400  HGB 8.6* 8.7* 9.3* 9.0* 9.1*  HCT 28.4* 28.7* 29.6* 28.1* 29.2*  PLT 173 175 201 166 190  INR -- -- -- -- 1.38  APTT -- -- -- -- --   A:  Chronic stable anemia, likely of chronic disease. P: -->  PRBC for hgb < 7gm%  -->  DVT Px -   INFECTIOUS  Lab 12/03/11 0505 12/02/11 0600 11/30/11 0400 11/29/11 0400 11/28/11 0400 11/27/11 2241  WBC 13.3* 14.4* 19.2* 11.3* 15.9* --  PROCALCITON -- -- -- -- -- 0.12   A:  Suspected HCAP, but PCT level speaks against it. P: -->  Narrow abx to Levofloxacin 4/5  -->  Cultures as per dashboard --po levaquin 4/9  ENDOCRINE  Lab 12/01/11 1538 12/01/11 1210 12/01/11 0804 12/01/11 0337 11/30/11 2350  GLUCAP 128* 116* 143* 120* 129*   A:  No active issues.  Risk of steroid-induced hyperglycemia. P: -->  CBG checks / SSI   BEST PRACTICE / DISPOSITION -->  sdU status under PCCM -->  Full code -->  NPO -->  Heparin [place] for DVT Px -->  Protonix IV for GI Px -->  Family is not available  Brett Canales Minor ACNP [facility] Pager [phone]  till 3 pm If no answer page [phone]  Independently examined pt, evaluated data & formulated above care plan with NP  [facility].   12/03/2011, 8:33 AM

## 2011-12-03 NOTE — Progress Notes (Signed)
eLink Physician-Brief Progress Note Patient Name: [name] DOB: 07/31/1936 MRN: [id number]  Date of Service  12/03/2011   HPI/Events of Note   Hypokalemia  eICU Interventions  Potassium replaced   Intervention Category Intermediate Interventions: Electrolyte abnormality - evaluation and management  DETERDING,ELIZABETH 12/03/2011, 6:32 AM

## 2011-12-03 NOTE — Evaluation (Signed)
Physical Therapy Evaluation Patient Details Name: [name] MRN: [id number] DOB: 12/26/1935 Today's Date: 12/03/2011  Problem List:  Patient Active Problem List  Diagnoses  . ANEMIA, SEVERE  . Severe mitral regurgitation  . Paroxysmal atrial fibrillation  . CHF  . ESOPHAGEAL ULCER, WITH BLEEDING  . DOE (dyspnea on exertion)  . Chronic diastolic heart failure  . Hypertension  . Chronic kidney disease  . COPD (chronic obstructive pulmonary disease)  . S/P AAA repair  . Atrial fibrillation with RVR  . Leukocytosis  . NSVT (nonsustained ventricular tachycardia)  . Acute and chronic respiratory failure with hypercapnia  . Acute respiratory failure with hypoxia  . Acute pulmonary edema with congestive heart failure  . Pneumonia due to infectious agent  . Acute exacerbation of chronic obstructive pulmonary disease (COPD)  . Acute renal failure  . Encephalopathy acute    Past Medical History:  Past Medical History  Diagnosis Date  . Abdominal aortic aneurysm     s/p stent graft repair in 6/12  . Iliac artery aneurysm, bilateral   . Anemia   . Atrial fibrillation     no coumadin due to h/o GI bleeding. 05/2011 stopped  metoprolol, amiodarone, digoxin, diltiazem due to bradycardia   . Tibial plateau fracture     non displaced  . COPD (chronic obstructive pulmonary disease)     emphysema and severe outflow obstruction, on home O2  . Lung mass     right lower lobe spiculated mass  . Chronic kidney disease     Stage II  . Oxygen dependent   . CHF (congestive heart failure)     Diastolic Heart failure, Hx of severe MR;  Echocardiogram 05/01/11: Mild LVH, EF 55-60%, mild AI, severe MR, severe LAE, moderate RAE, normal RVSF, moderate-severe TR, PASP 79, severe pulmonary hypertension  . Hypertension   . Osteoporosis   . Alcoholic hepatitis    Past Surgical History:  Past Surgical History  Procedure Date  . Arteriovenous graft placement   . Abdominal aortic aneurysm repair    . Bladder surgery   . Neck/back surgery     PT Assessment/Plan/Recommendation PT Assessment Clinical Impression Statement: Patient admitted with COPD/CHF and respiratory distress.  Intubated 4/4 to 4/7 now with mild level of deconditioning with decreased activity tolerance and decreased mobility.  Will benefit from skilled PT in the acute setting to maximzie independence and allow d/c to home with HHPT. PT Recommendation/Assessment: Patient will need skilled PT in the acute care venue PT Problem List: Decreased activity tolerance;Decreased mobility;Cardiopulmonary status limiting activity Barriers to Discharge: Decreased caregiver support PT Therapy Diagnosis : Difficulty walking;Generalized weakness PT Plan PT Frequency: Min 3X/week PT Treatment/Interventions: DME instruction;Gait training;Stair training;Functional mobility training;Therapeutic activities;Therapeutic exercise;Patient/family education PT Recommendation Follow Up Recommendations: Home health PT;Supervision - Intermittent Equipment Recommended: None recommended by PT PT Goals  Acute Rehab PT Goals PT Goal Formulation: With patient Pt will go Sit to Stand: with modified independence PT Goal: Sit to Stand - Progress: Goal set today Pt will go Stand to Sit: with modified independence PT Goal: Stand to Sit - Progress: Goal set today Pt will Ambulate: >150 feet;with modified independence;with least restrictive assistive device;Other (comment) (with vitals stable) PT Goal: Ambulate - Progress: Goal set today Pt will Go Up / Down Stairs: 1-2 stairs;with least restrictive assistive device;with modified independence PT Goal: Up/Down Stairs - Progress: Goal set today Pt will Perform Home Exercise Program: Independently PT Goal: Perform Home Exercise Program - Progress: Goal set today  PT Evaluation Precautions/Restrictions  Precautions Precautions: Fall Precaution Comments: oxygen dependent Prior Functioning  Home  Living Lives With: Alone Receives Help From: Family (sister and brother live close) Type of Home: House Home Layout: One level Home Access: Stairs to enter Entrance Stairs-Rails: Right;Left;Can reach both Secretary/administrator of Steps: 2 Bathroom Shower/Tub: Engineer, manufacturing systems: Standard Home Adaptive Equipment: Bedside commode/3-in-1;Shower chair without back;Walker - rolling;Straight cane Prior Function Level of Independence: Independent with basic ADLs;Requires assistive device for independence;Independent with gait;Independent with transfers Driving: No Cognition Cognition Arousal/Alertness: Awake/alert Overall Cognitive Status: Appears within functional limits for tasks assessed Sensation/Coordination Sensation Light Touch: Appears Intact Extremity Assessment RLE Assessment RLE Assessment: Within Functional Limits LLE Assessment LLE Assessment: Within Functional Limits Mobility (including Balance) Bed Mobility Supine to Sit: 6: Modified independent (Device/Increase time) Transfers Sit to Stand: 5: Supervision;From bed;With upper extremity assist Sit to Stand Details (indicate cue type and reason): standing before ready with lines/O2 tubing Stand to Sit: 5: Supervision;To chair/3-in-1;With armrests;With upper extremity assist Ambulation/Gait Ambulation/Gait Assistance: 4: Min assist Ambulation/Gait Assistance Details (indicate cue type and reason): minguard assist for safety; patient able to walk, but c/o knee pain increased with walking and audible crepitus with bilateral hip internal rotation vs increased tibial torsion with genuvarus deformity Ambulation Distance (Feet): 170 Feet Assistive device: Rolling walker Gait Pattern: Trunk flexed;Decreased stride length  Posture/Postural Control Posture/Postural Control: Postural limitations Postural Limitations: flexed in trunk with hip internal rotation vs. genu varus with increased tibial torsion  bilaterally Exercise    End of Session PT - End of Session Equipment Utilized During Treatment: Gait belt Activity Tolerance: Patient limited by fatigue (HR up to 137, RR 22) Patient left: in chair General Behavior During Session: [facility] for tasks performed Cognition: [facility] for tasks performed  [facility] 12/03/2011, 4:35 PM

## 2011-12-04 ENCOUNTER — Inpatient Hospital Stay (HOSPITAL_COMMUNITY): Payer: Medicare Other

## 2011-12-04 DIAGNOSIS — I4891 Unspecified atrial fibrillation: Secondary | ICD-10-CM

## 2011-12-04 DIAGNOSIS — I509 Heart failure, unspecified: Secondary | ICD-10-CM

## 2011-12-04 DIAGNOSIS — I5032 Chronic diastolic (congestive) heart failure: Secondary | ICD-10-CM

## 2011-12-04 DIAGNOSIS — I501 Left ventricular failure: Secondary | ICD-10-CM

## 2011-12-04 LAB — BASIC METABOLIC PANEL
BUN: 61 mg/dL — ABNORMAL HIGH (ref 6–23)
GFR calc Af Amer: 41 mL/min — ABNORMAL LOW (ref >90)
GFR calc non Af Amer: 36 mL/min — ABNORMAL LOW (ref >90)
Potassium: 4 mEq/L (ref 3.5–5.1)
Sodium: 140 mEq/L (ref 135–145)

## 2011-12-04 LAB — CULTURE, BLOOD (ROUTINE X 2)
Culture  Setup Time: 201304040824
Culture: NO GROWTH

## 2011-12-04 LAB — CBC
HCT: 27.1 % — ABNORMAL LOW (ref 39.0–52.0)
MCHC: 30.6 g/dL (ref 30.0–36.0)
RDW: 18.4 % — ABNORMAL HIGH (ref 11.5–15.5)

## 2011-12-04 LAB — PRO B NATRIURETIC PEPTIDE: Pro B Natriuretic peptide (BNP): 6025 pg/mL — ABNORMAL HIGH (ref 0–125)

## 2011-12-04 MED ORDER — POTASSIUM CHLORIDE CRYS ER 20 MEQ PO TBCR
40.0000 meq | EXTENDED_RELEASE_TABLET | Freq: Every day | ORAL | Status: DC
  Administered 2011-12-04 – 2011-12-09 (×6): 40 meq via ORAL
  Filled 2011-12-04 (×7): qty 2

## 2011-12-04 MED ORDER — PREDNISONE 20 MG PO TABS
40.0000 mg | ORAL_TABLET | Freq: Every day | ORAL | Status: DC
  Administered 2011-12-05 – 2011-12-09 (×5): 40 mg via ORAL
  Filled 2011-12-04 (×5): qty 2

## 2011-12-04 MED ORDER — FUROSEMIDE 10 MG/ML IJ SOLN
40.0000 mg | Freq: Two times a day (BID) | INTRAMUSCULAR | Status: DC
  Administered 2011-12-04: 40 mg via INTRAVENOUS
  Filled 2011-12-04 (×3): qty 4

## 2011-12-04 NOTE — Progress Notes (Signed)
Nutrition Follow-up  Diet Order:  Cardiac  Patient now off TF and mechanical ventilation. Patient's diet has been advanced to Cardiac diet. Good PO intake documented at meals between 90-100%. Patient reports appetite is improving.   Meds: Scheduled Meds:   . antiseptic oral rinse  1 application Mouth Rinse QID  . aspirin  325 mg Oral Daily  . chlorhexidine  15 mL Mouth/Throat BID  . digoxin  0.25 mg Intravenous Once  . digoxin  0.125 mg Oral Daily  . diltiazem  120 mg Oral Q12H  . heparin subcutaneous  5,000 Units Subcutaneous Q8H  . ipratropium  0.5 mg Nebulization Q6H  . levalbuterol  0.63 mg Nebulization Q6H  . levofloxacin  500 mg Oral Daily  . methylPREDNISolone (SOLU-MEDROL) injection  40 mg Intravenous Q24H  . metoprolol tartrate  50 mg Oral BID  . metoprolol tartrate  25 mg Oral Once  . predniSONE  40 mg Oral Q breakfast  . DISCONTD: ipratropium  0.5 mg Nebulization Q4H  . DISCONTD: levalbuterol  0.63 mg Nebulization Q6H   Continuous Infusions:   . sodium chloride 500 mL (12/02/11 1058)   PRN Meds:.levalbuterol, DISCONTD: ipratropium  Labs:  CMP     Component Value Date/Time   NA 140 12/04/2011 0500   K 4.0 12/04/2011 0500   CL 102 12/04/2011 0500   CO2 29 12/04/2011 0500   GLUCOSE 109* 12/04/2011 0500   BUN 61* 12/04/2011 0500   CREATININE 1.79* 12/04/2011 0500   CALCIUM 8.4 12/04/2011 0500   CALCIUM 7.9* 05/13/2011 1426   PROT 6.2 12/02/2011 0600   ALBUMIN 2.7* 12/02/2011 0600   AST 22 12/02/2011 0600   ALT 27 12/02/2011 0600   ALKPHOS 66 12/02/2011 0600   BILITOT 0.5 12/02/2011 0600   GFRNONAA 36* 12/04/2011 0500   GFRAA 41* 12/04/2011 0500     Intake/Output Summary (Last 24 hours) at 12/04/11 1210 Last data filed at 12/04/11 1000  Gross per 24 hour  Intake   1960 ml  Output   2145 ml  Net   -185 ml    Weight Status:  151 lb. Weight is stable.   Re-estimated needs:  Kcal: 1610-9604     Protein: 40.9-51.13 grams     Fluid: per MD  Nutrition Dx:  -Inadequate oral  intake (NI-2.1). Status: Progressing.   Goal:  1. Diet advancement as medically able. -Met 2. PO intake > 75% at meals. 3. Promote weight maintenance.   Intervention:  Patient denies snacks at this time. Will continue to monitor PO intake for adequacy.  Monitor:  Weight trends, PO intake, Labs   Iven Finn [facility] [place] Pager #:  [phone]

## 2011-12-04 NOTE — Progress Notes (Signed)
Name: TYLOR COURTWRIGHT MRN: [id number] DOB: 16-Apr-1936    LOS: 7  [place] [facility]      Brief patient profile:  76 yo with GOLD stage IV COPD (on home oxygen 2 lpm), diastolic CHF (EF 40%), pulmonary hypertension, severe TR and recent [facility] admission brought to [facility] ED am 4/4 in respiratory distress likely secondary to acute pulmonary edema. Failed NIMV / BiPAP.  Intubated.  Lines / Drains: 4/3  Foley ETT tube 4/4 >>12/01/11) Rt CVL>> 4/10 Cultures: 4/3  Blood>>>ng 4/3 U. Strep>>>neg 4/4 U Legionella > neg  4/4 MRSA > neg 4/5 Trach asp >>>nl flora   Antibiotics: 4/3  Cefepime (empiric, HCAP)>>>4/5 4/3  Vancomycin (empiric, HCAP)> 4/5 4/3  Levofloxacin (empiric, HCAP/CAP)>>>4/13  Tests / Events: 4/3  CXR >>>  Pulmonary vascular congestion, possible LLL infiltrate 4/4 CXR>>>Appliances positioned as described and apparent satisfactory location. Persistent cardiac enlargement with pulmonary vascular congestion and interstitial edema.    SUBJECTIVE/OVERNIGHT/INTERVAL HX Off dilt drip. Awake and alert.  Vital Signs: Filed Vitals:   12/04/11 0500 12/04/11 0600 12/04/11 0800 12/04/11 0808  BP: 110/79 111/61    Pulse: 103 67    Temp:   98.5 F (36.9 C)   TempSrc:   Oral   Resp: 18 15    Height:      Weight: 151 lb 0.2 oz (68.5 kg)     SpO2: 96% 96%  96%  FIO2 0.30    Intake/Output Summary (Last 24 hours) at 12/04/11 0901 Last data filed at 12/04/11 0600  Gross per 24 hour  Intake   1700 ml  Output   1845 ml  Net   -145 ml    Physical Examination: General:  Elderly male , awake and alert Neuro:  intact t Neck: No  JVD noted Cardiovascular:  Irregular rhythm, distant heart sounds , Cardizem drip off, hr 103   Lungs:  Even/non-labored t, lungs bilaterally coarse Abdomen:  Soft, nontender, nondistended, bowel sounds present, eating Musculoskeletal:  Moves all extremities, no pedal edema Skin:  No rash  Ventilator settings:    Labs and Imaging:  Reviewed. .  Dg  Chest Port 1 View  12/04/2011  *RADIOLOGY REPORT*  Clinical Data: Edema.  PORTABLE CHEST - 1 VIEW  Comparison: 12/03/2011  Findings: Cardiomegaly.  Right central line remains in place, unchanged.  Vascular congestion.  Bibasilar atelectasis slightly improved.  IMPRESSION: Slight improved aeration in the lung bases.  Continued vascular congestion and bibasilar atelectasis.  Original Report Authenticated By: Cyndie Chime, M.D.   Dg Chest Port 1 View  12/03/2011  *RADIOLOGY REPORT*  Clinical Data: Status post extubation.  Respiratory distress.  PORTABLE CHEST - 1 VIEW  Comparison: 12/02/2011.  Findings: Unchanged central venous catheter from right-sided approach lying with its tip at the SVC/RA junction.  Cardiomegaly. Right greater than left basilar opacities are stable to slightly improved, with increasing lung volumes.  A portion of the lower lung zone is excluded on the left. No pneumothorax.  IMPRESSION: Slight improvement aeration.  Original Report Authenticated By: Elsie Stain, M.D.    Lab 12/04/11 0500 12/03/11 0505 12/02/11 0600  NA 140 142 146*  K 4.0 2.9* 3.9  CL 102 101 106  CO2 29 30 30   BUN 61* 68* 73*  CREATININE 1.79* 2.00* 2.30*  GLUCOSE 109* 123* 124*    Lab 12/04/11 0500 12/03/11 0505 12/02/11 0600  HGB 8.3* 8.6* 8.7*  HCT 27.1* 28.4* 28.7*  WBC 13.1* 13.3* 14.4*  PLT 159 173 175  ASSESSMENT AND PLAN  NEUROLOGIC A:  Encephalopathy secondary to sedation. On 12/01/11 normal mental status P: -4/8 resolved  PULMONARY  Lab 11/28/11 1720 11/28/11 0431 11/28/11 0223 11/27/11 2249  PHART 7.425 7.259* 7.158* 7.410  PCO2ART 33.4* 42.5 49.9* 27.6*  PO2ART 87.5 295.0* 216.0* 77.3*  HCO3 21.5 18.4* 17.0* 17.2*  O2SAT 97.7 99.8 98.6 95.0   A:  Acute hypoxemic / hypercarbic respiratory failure. Metabolic acidosis. Acute pulmonary edema, less likely acute COPD exacerbation or HCAP.  Pulmonary hypertension.  P: -->  extubated 12/01/11 -->  Follow up CXR  periodically -->  Bronchodilators via nebulizer -->  Solu-Medrol 40 mg IV q24h 4/10 , 4/11 change to prednisone. -->  Hold inhalers -->  narrowed abx 4/5 to levaquin -4/8 change to po levaquin with stop date.  CARDIOVASCULAR  Lab 12/04/11 0500 12/02/11 0600 11/28/11 1451 11/28/11 0829 11/28/11 0400 11/27/11 2242 11/27/11 2241  TROPONINI -- -- <0.30 <0.30 <0.30 -- --  LATICACIDVEN -- -- -- -- -- 4.1* --  PROBNP 6025.0* 10092.0* -- -- -- -- 13987.0*   A:  Acute CHF (diastolic) exacerbation with acute pulmonary edema.  Atrial fibrillation / controlled rate.  History of HTN.  Initially hypotensive, now resolved.  Severe TR.   - on 11/30/11: A Fib RVR 130 after lopressors and cardizem held. Metoprolol po restarted b ut still A Fib RVR and changed to cardizem gtt -4/8 po dilt started and add low dose bb 4/9 add dig and increase dose of po dilt, stop iv dilt     Lab 11/28/11 1451 11/28/11 0829 11/28/11 0400  TROPONINI <0.30 <0.30 <0.30    P: -->  Lasix 40 mg IV q12h -stopped 4/9 -->  ASA 4/10 is followed by [employer] cards. He was on dig/lopressor/dilt in past and stopped for bradycardia. Will consult cards 4/10     Lab 12/04/11 0500 12/02/11 0600 11/27/11 2241  PROBNP 6025.0* 10092.0* 13987.0*  -4/10 hr 103 on dig/dilt/lopressor     RENAL  Lab 12/04/11 0500 12/03/11 0505 12/02/11 0600 12/01/11 0330 11/30/11 0400  NA 140 142 146* 146* 143  K 4.0 2.9* -- -- --  CL 102 101 106 107 106  CO2 29 30 30 29 26   BUN 61* 68* 73* 66* 64*  CREATININE 1.79* 2.00* 2.30* 2.08* 2.34*  CALCIUM 8.4 8.5 8.9 9.0 8.9  MG 2.0 -- -- 2.5 --  PHOS 3.4 -- -- 3.4 --   A:  Acute on chronic renal failure / acute kidney injury.  P:  - replete K  -->  BMP in AM  GASTROINTESTINAL  Lab 12/02/11 0600 11/28/11 0400 11/27/11 2241  AST 22 49* 40*  ALT 27 39 32  ALKPHOS 66 87 91  BILITOT 0.5 0.5 0.6  PROT 6.2 6.7 7.4  ALBUMIN 2.7* 3.0* 3.4*   A:  Mild transaminitis, possibly secondary to alcohol  use.  Malnutrition. P: -->  GI Px -->  Po diet   HEMATOLOGIC  Lab 12/04/11 0500 12/03/11 0505 12/02/11 0600 11/30/11 0400 11/29/11 0400 11/28/11 0400  HGB 8.3* 8.6* 8.7* 9.3* 9.0* --  HCT 27.1* 28.4* 28.7* 29.6* 28.1* --  PLT 159 173 175 201 166 --  INR -- -- -- -- -- 1.38  APTT -- -- -- -- -- --   A:  Chronic stable anemia, likely of chronic disease. P: -->  PRBC for hgb < 7gm%  -->  DVT Px -   INFECTIOUS  Lab 12/04/11 0500 12/03/11 0505 12/02/11 0600 11/30/11 0400 11/29/11 0400 11/27/11  2241  WBC 13.1* 13.3* 14.4* 19.2* 11.3* --  PROCALCITON -- -- -- -- -- 0.12   A:  Suspected HCAP, but PCT level speaks against it. P: -->  Narrow abx to Levofloxacin 4/5  -->  Cultures as per dashboard --po levaquin 4/9  ENDOCRINE  Lab 12/01/11 1538 12/01/11 1210 12/01/11 0804 12/01/11 0337 11/30/11 2350  GLUCAP 128* 116* 143* 120* 129*   A:  No active issues.  Risk of steroid-induced hyperglycemia. P: -->  CBG checks / SSI   BEST PRACTICE / DISPOSITION -->  tele status under PCCM  -->  Full code -->  Full diet -->  Heparin  for DVT Px -->  Protonix po for GI Px -->  Family is not available -dc cvl dispo - ? Short term rehab  Brett Canales Minor ACNP [facility] Pager [phone] till 3 pm If no answer page [phone]  Independently examined pt, evaluated data & formulated above care plan with NP [facility].

## 2011-12-04 NOTE — Consult Note (Signed)
CARDIOLOGY CONSULT NOTE   Patient ID: SIGIFREDO PIGNATO MRN: [id number] DOB/AGE: May 25, 1936 76 y.o.  Admit date: 11/27/2011  Primary Physician   Beverley Fiedler, MD, MD Primary Cardiologist   TW  Reason for Consultation   Tachy-brady  BJY:NWGNFA Kathie Rhodes Amrhein is a 76 y.o. AA male initially presented to the ED for weakness on 4/3, but found to be in respiratory distress and afib with RVR.  Attempts for bipap failed and he was subsequently intubated on 4/4 and admitted to the ICU on Cardizem gtt.  Over the course of his ICU stay, his afib with RVR was nonresponsive to treatment and dig and betablocker were added which began to control his rate. Currently he is at baseline with his breathing and was extubated on 12/01/2011. He had some problems with bradycardia on multiple rate control medications and the medications were changed but then his heart rate increased. His diltiazem M. and digoxin were changed to by mouth medications but his heart rate control is suboptimal and cardiology was asked to assist.  Mr. Fye feels that his respiratory status is currently at baseline. He is not having presyncope or syncope. He is not having chest pain.   Past Medical History  Diagnosis Date  . Abdominal aortic aneurysm     s/p stent graft repair in 6/12  . Iliac artery aneurysm, bilateral   . Anemia   . Atrial fibrillation     no coumadin due to h/o GI bleeding. 05/2011 stopped  metoprolol, amiodarone, digoxin, diltiazem due to bradycardia   . Tibial plateau fracture     non displaced  . COPD (chronic obstructive pulmonary disease)     emphysema and severe outflow obstruction, on home O2  . Lung mass     right lower lobe spiculated mass  . Chronic kidney disease     Stage II  . Oxygen dependent   . CHF (congestive heart failure)     Diastolic Heart failure, Hx of severe MR;  Echocardiogram 05/01/11: Mild LVH, EF 55-60%, mild AI, severe MR, severe LAE, moderate RAE, normal RVSF, moderate-severe TR,  PASP 79, severe pulmonary hypertension  . Hypertension   . Osteoporosis   . Alcoholic hepatitis      Past Surgical History  Procedure Date  . Arteriovenous graft placement   . Abdominal aortic aneurysm repair   . Bladder surgery   . Neck/back surgery     No Known Allergies  I have reviewed the patient's current medications   . antiseptic oral rinse  1 application Mouth Rinse QID  . aspirin  325 mg Oral Daily  . chlorhexidine  15 mL Mouth/Throat BID  . digoxin  0.25 mg Intravenous Once  . digoxin  0.125 mg Oral Daily  . diltiazem  120 mg Oral Q12H  . heparin subcutaneous  5,000 Units Subcutaneous Q8H  . ipratropium  0.5 mg Nebulization Q6H  . levalbuterol  0.63 mg Nebulization Q6H  . levofloxacin  500 mg Oral Daily  . methylPREDNISolone (SOLU-MEDROL) injection  40 mg Intravenous Q24H  . metoprolol tartrate  50 mg Oral BID  . metoprolol tartrate  25 mg Oral Once  . predniSONE  40 mg Oral Q breakfast   Prescriptions prior to admission  Medication Sig Dispense Refill  . albuterol (PROVENTIL HFA;VENTOLIN HFA) 108 (90 BASE) MCG/ACT inhaler Inhale 2 puffs into the lungs every 6 (six) hours as needed. For shortness of breath      . albuterol (PROVENTIL) 2 MG tablet Take 2 mg by mouth  3 (three) times daily.      Marland Kitchen amLODipine (NORVASC) 10 MG tablet Take 10 mg by mouth daily.      Marland Kitchen aspirin EC 325 MG EC tablet Take 1 tablet (325 mg total) by mouth daily.  30 tablet    . budesonide-formoterol (SYMBICORT) 160-4.5 MCG/ACT inhaler Inhale 2 puffs into the lungs 2 (two) times daily.      . cephALEXin (KEFLEX) 500 MG capsule Take 1 capsule (500 mg total) by mouth every 12 (twelve) hours.  8 capsule  0  . diltiazem (CARDIZEM CD) 180 MG 24 hr capsule Take 1 capsule (180 mg total) by mouth daily.  30 capsule  1  . furosemide (LASIX) 80 MG tablet Take 1 tablet (80 mg total) by mouth daily.      . hydrALAZINE (APRESOLINE) 25 MG tablet Take 25 mg by mouth 3 (three) times daily.      . metoprolol  (LOPRESSOR) 50 MG tablet Take 1 tablet (50 mg total) by mouth 2 (two) times daily.  60 tablet  1  . pantoprazole (PROTONIX) 40 MG tablet Take 40 mg by mouth 2 (two) times daily.       Marland Kitchen tiotropium (SPIRIVA) 18 MCG inhalation capsule Place 18 mcg into inhaler and inhale daily.          History   Social History  . Marital Status: Single    Spouse Name: N/A    Number of Children: 0  . Years of Education: N/A   Occupational History  . retired    Social History Main Topics  . Smoking status: Former Smoker -- 0.5 packs/day for 20 years    Types: Cigarettes    Quit date: 08/26/2009  . Smokeless tobacco: Never Used  . Alcohol Use: No     history of ETOH abuse in the past  . Drug Use: No  . Sexually Active: Not on file   Social History Narrative   Lives alone, but has a sister across the street and a brother also. Never married, no kids. Ambulatory with a cane and walker. Denies smoking, drinking, drugs at presents. Previously had visiting nurse but not now.     Family History  Problem Relation Age of Onset  . Emphysema Father   . Leukemia Father   . Heart disease Mother   . Rheum arthritis Mother   . Anemia Mother   . Hypertension Mother   . Coronary artery disease Mother     ROS:  Full 14 point review of systems complete and found to be negative unless listed above.  Physical Exam: Blood pressure 121/78, pulse 103, temperature 97.8 F (36.6 C), temperature source Oral, resp. rate 20, height 5\' 8"  (1.727 m), weight 151 lb 0.2 oz (68.5 kg), SpO2 96.00%.  General: Well developed, cachectic AA male in no acute distress. Sitting up in bed eating lunch. Head: Nasal cannula in place. Eyes PERRLA, No xanthomas. Normocephalic and atraumatic, oropharynx without edema or exudate. Dentition-poor.  Lungs: Barrel chest, diminished bases bilaterally, wheezing throughout with crackles in bilateral bases. Good effort, equal and bilateral expansion.   Heart: Heart irregular rate and rhythm with  S1, S2, no rubs, gallops or  murmur. pulses are 2+ all 4 extrem.  No lifts, heaves or thrills. No chest wall tenderness.  Neck: Mildly elevated JVD (8-9 cm) with + hepatojugular reflux. No carotid bruit. No lymphadenopathy. Trachea midline. No thyromegaly. Abdomen: Bowel sounds present, abdomen soft and non-tender without masses or hernias noted. Msk:  No spine  or cva tenderness. No weakness, no joint deformities or effusions. Extremities: Nonpitting bilateral ankle edema. No clubbing or cyanosis.  Neuro: Alert and oriented X 3. No focal deficits noted. Psych: Slow to respond, but appropriate. Good affect, pleasant. Normal tone and timber of speech. Skin: BLE scattered areas of hyperpigmentation. No rashes or lesions noted.  Labs:   Lab Results  Component Value Date   WBC 13.1* 12/04/2011   HGB 8.3* 12/04/2011   HCT 27.1* 12/04/2011   MCV 100.0 12/04/2011   PLT 159 12/04/2011   Lab 12/04/11 0500 12/02/11 0600  NA 140 --  K 4.0 --  CL 102 --  CO2 29 --  BUN 61* --  CREATININE 1.79* --  CALCIUM 8.4 --  PROT -- 6.2  BILITOT -- 0.5  ALKPHOS -- 66  ALT -- 27  AST -- 22  GLUCOSE 109* --   Pro B Natriuretic peptide (BNP)  Date/Time Value Range Status  12/04/2011  5:00 AM 6025.0* 0-125 (pg/mL) Final  12/02/2011  6:00 AM 10092.0* 0-125 (pg/mL) Final   ECG:28-Nov-2011 05:16:34  Atrial fibrillation with slow ventricular response Cannot rule out Atrial flutter 3:1 conduction Abnormal ECG Vent. rate 48 BPM PR interval * ms QRS duration 98 ms QT/QTc 524/468 ms P-R-T axes 5 59 72  Radiology:   Dg Chest Port 1 View 12/04/2011  *RADIOLOGY REPORT*  Clinical Data: Edema.  PORTABLE CHEST - 1 VIEW  Comparison: 12/03/2011  Findings: Cardiomegaly.  Right central line remains in place, unchanged.  Vascular congestion.  Bibasilar atelectasis slightly improved.  IMPRESSION: Slight improved aeration in the lung bases.  Continued vascular congestion and bibasilar atelectasis.  Original Report  Authenticated By: Cyndie Chime, M.D.   ASSESSMENT AND PLAN:   The patient was seen today by Dr. Shirlee Latch, the patient evaluated and the data reviewed.  1. Atrial fibrillation: In the setting of an acute illness, a heart rate in the 90s to 100s is acceptable. His current medication should be continued with adjustments made after he recovers more from the acute event.  2.  Acute pulmonary edema with diastolic congestive heart failure - he would benefit from further diuresis and we will continue his IV Lasix for another few days. His renal function and potassium levels will be followed closely.  Other problems per primary M.D.: Acute and chronic respiratory failure with hypercapnia   Acute respiratory failure with hypoxia  Pneumonia due to infectious agent  Acute exacerbation of chronic obstructive pulmonary disease (COPD)  Acute renal failure  Encephalopathy acute  Signed: Theodore Demark 12/04/2011, 11:45 AM Co-Sign MD  Patient seen with PA, agree with note.  76 yo with h/o atrial fibrillation severe COPD, CKD, diastolic CHF in the setting of severe MR/TR and pulmonary hypertension was admitted with acute on chronic diastolic CHF with pulmonary edema in the setting of afib/RVR.  He has had bradycardia in the past on multiple nodal blocking agents. 1. Atrial fibrillation: HR is reasonable currently in the 90s - 100s (atrial fibrillation). Would continue current regimen without uptitration.  Given history of symptomatic bradycardia on multiple nodal blocking agents in the past, will aim to try to decrease his overall nodal blocker load in the future when he has recovered from the current episode (probably aim first to stop digoxin). He is not an anticoagulation candidate given GI bleed history.  2. Acute on chronic diastolic CHF: In the setting of atrial fibrillation/RVR with pre-existing severe MR/TR and pulmonary HTN.  He is still volume overloaded with JVP 8-9  cm and HJR.  He is off Lasix at this  time, presumably due to rise in creatinine during hospitalization.  Creatinine now back down again to his baseline.  - Start Lasix gently at 40 mg IV bid and follow I/Os, weights, and creatinine closely.  3. Valvular heart disease: Severe MR and TR.  With his severe COPD, he is a poor candidate for valve surgery.   Marca Ancona 12/04/2011 2:06 PM

## 2011-12-05 LAB — BASIC METABOLIC PANEL
CO2: 29 mEq/L (ref 19–32)
GFR calc non Af Amer: 39 mL/min — ABNORMAL LOW (ref >90)
Glucose, Bld: 109 mg/dL — ABNORMAL HIGH (ref 70–99)
Potassium: 4.2 mEq/L (ref 3.5–5.1)
Sodium: 138 mEq/L (ref 135–145)

## 2011-12-05 LAB — CBC
HCT: 28.5 % — ABNORMAL LOW (ref 39.0–52.0)
Hemoglobin: 8.8 g/dL — ABNORMAL LOW (ref 13.0–17.0)
WBC: 10.6 10*3/uL — ABNORMAL HIGH (ref 4.0–10.5)

## 2011-12-05 LAB — PRO B NATRIURETIC PEPTIDE: Pro B Natriuretic peptide (BNP): 8479 pg/mL — ABNORMAL HIGH (ref 0–125)

## 2011-12-05 MED ORDER — BUDESONIDE-FORMOTEROL FUMARATE 160-4.5 MCG/ACT IN AERO
2.0000 | INHALATION_SPRAY | Freq: Two times a day (BID) | RESPIRATORY_TRACT | Status: DC
  Administered 2011-12-05 – 2011-12-09 (×9): 2 via RESPIRATORY_TRACT
  Filled 2011-12-05: qty 6

## 2011-12-05 MED ORDER — DILTIAZEM HCL ER COATED BEADS 360 MG PO CP24
360.0000 mg | ORAL_CAPSULE | Freq: Every day | ORAL | Status: DC
  Administered 2011-12-05 – 2011-12-09 (×5): 360 mg via ORAL
  Filled 2011-12-05 (×5): qty 1

## 2011-12-05 MED ORDER — FUROSEMIDE 80 MG PO TABS
80.0000 mg | ORAL_TABLET | Freq: Every day | ORAL | Status: DC
  Administered 2011-12-05 – 2011-12-09 (×5): 80 mg via ORAL
  Filled 2011-12-05 (×5): qty 1

## 2011-12-05 MED ORDER — TIOTROPIUM BROMIDE MONOHYDRATE 18 MCG IN CAPS
18.0000 ug | ORAL_CAPSULE | Freq: Every day | RESPIRATORY_TRACT | Status: DC
  Administered 2011-12-05 – 2011-12-08 (×4): 18 ug via RESPIRATORY_TRACT
  Filled 2011-12-05 (×2): qty 5

## 2011-12-05 NOTE — Progress Notes (Signed)
Name: [name] MRN: [id number] DOB: [birth year]    LOS: 8  [place] [facility]      Brief patient profile:  76 yo with GOLD stage IV COPD (on home oxygen 2 lpm), diastolic CHF (EF 40%), pulmonary hypertension, severe TR and recent [facility]spital admission brought to [facility] ED am 4/4 in respiratory distress likely secondary to acute pulmonary edema. Failed NIMV / BiPAP.  Intubated.  Lines / Drains: 4/3  Foley ETT tube 4/4 >>4/7 Rt CVL>> 4/10  Cultures: 4/3  Blood>>>ng 4/3 U. Strep>>>neg 4/4 U Legionella > neg  4/4 MRSA > neg 4/5 Trach asp >>>nl flora   Antibiotics: 4/3  Cefepime (empiric, HCAP)>>>4/5 4/3  Vancomycin (empiric, HCAP)> 4/5 4/3  Levofloxacin (empiric, HCAP/CAP)>>>4/13  Tests / Events: 4/3  CXR >>>  Pulmonary vascular congestion, possible LLL infiltrate 4/4 CXR>>>Appliances positioned as described and apparent satisfactory location. Persistent cardiac enlargement with pulmonary vascular congestion and interstitial edema.    SUBJECTIVE/OVERNIGHT/INTERVAL HX Off dilt drip. Awake and alert.  Vital Signs: Filed Vitals:   12/04/11 2059 12/05/11 0454 12/05/11 0929 12/05/11 1034  BP: 123/81 132/88  137/80  Pulse: 100 103    Temp: 97.8 F (36.6 C) 97.7 F (36.5 C)    TempSrc: Oral Oral    Resp: 19 18    Height:      Weight:  152 lb 12.8 oz (69.31 kg)    SpO2: 97% 97% 96%   FIO2 0.30    Intake/Output Summary (Last 24 hours) at 12/05/11 1108 Last data filed at 12/05/11 0951  Gross per 24 hour  Intake    480 ml  Output   2625 ml  Net  -2145 ml    Physical Examination: General:  Elderly male , awake and alert Neuro:  intact t Neck: No  JVD noted Cardiovascular:  Irregular rhythm, distant heart sounds , hr 108   Lungs:  Even/non-labored t, lungs bilaterally coarse Abdomen:  Soft, nontender, nondistended, bowel sounds present, eating Musculoskeletal:  Moves all extremities, no pedal edema Skin:  No rash      Labs and Imaging:  Reviewed. .  Dg Chest Port 1  View  12/04/2011  *RADIOLOGY REPORT*  Clinical Data: Edema.  PORTABLE CHEST - 1 VIEW  Comparison: 12/03/2011  Findings: Cardiomegaly.  Right central line remains in place, unchanged.  Vascular congestion.  Bibasilar atelectasis slightly improved.  IMPRESSION: Slight improved aeration in the lung bases.  Continued vascular congestion and bibasilar atelectasis.  Original Report Authenticated By: Cyndie Chime, M.D.    Lab 12/05/11 0448 12/04/11 0500 12/03/11 0505  NA 138 140 142  K 4.2 4.0 2.9*  CL 101 102 101  CO2 29 29 30   BUN 56* 61* 68*  CREATININE 1.66* 1.79* 2.00*  GLUCOSE 109* 109* 123*    Lab 12/05/11 0448 12/04/11 0500 12/03/11 0505  HGB 8.8* 8.3* 8.6*  HCT 28.5* 27.1* 28.4*  WBC 10.6* 13.1* 13.3*  PLT 158 159 173       ASSESSMENT AND PLAN  NEUROLOGIC A:  Encephalopathy secondary to sedation. On 12/01/11 normal mental status P: -4/8 resolved  PULMONARY  Lab 11/28/11 1720  PHART 7.425  PCO2ART 33.4*  PO2ART 87.5  HCO3 21.5  O2SAT 97.7   A:  Acute hypoxemic / hypercarbic respiratory failure. Metabolic acidosis. Acute pulmonary edema, less likely acute COPD exacerbation or HCAP.  Pulmonary hypertension.  P: -->  extubated 12/01/11 -->  Follow up CXR periodically -->  Bronchodilators via nebulizer -->   4/11 change solumedrol  to prednisone. Taper -->  4/11 resume inhalers in preparation for dc -->4/8 change to po levaquin with stop date.  CARDIOVASCULAR  Lab 12/05/11 0440 12/04/11 0500 12/02/11 0600 11/28/11 1451  TROPONINI -- -- -- <0.30  LATICACIDVEN -- -- -- --  PROBNP 8479.0* 6025.0* 10092.0* --   A:  Acute CHF (diastolic) exacerbation with acute pulmonary edema.  Atrial fibrillation / controlled rate.  History of HTN.  Initially hypotensive, now resolved.  Severe TR.   - on 11/30/11: A Fib RVR 130 after lopressors and cardizem held. Metoprolol po restarted b ut still A Fib RVR and changed to cardizem gtt -4/8 po dilt started and add low dose bb 4/9 add  dig and increase dose of po dilt, stop iv dilt 4/10 cards consult>>dig d/c and dilt dose increased       Lab 11/28/11 1451  TROPONINI <0.30    P: -->off lasix -->  ASA 4/10 is followed by [employer] cards. He was on dig/lopressor/dilt in past and stopped for bradycardia. Will consult cards 4/10 4/11 dig dc per cards and dilt increased.    Lab 12/05/11 0440 12/04/11 0500 12/02/11 0600  PROBNP 8479.0* 6025.0* 10092.0*     RENAL  Lab 12/05/11 0448 12/04/11 0500 12/03/11 0505 12/02/11 0600 12/01/11 0330  NA 138 140 142 146* 146*  K 4.2 4.0 -- -- --  CL 101 102 101 106 107  CO2 29 29 30 30 29   BUN 56* 61* 68* 73* 66*  CREATININE 1.66* 1.79* 2.00* 2.30* 2.08*  CALCIUM 8.7 8.4 8.5 8.9 9.0  MG -- 2.0 -- -- 2.5  PHOS -- 3.4 -- -- 3.4   A:  Acute on chronic renal failure / acute kidney injury.  P:  - replete K as needed -->  BMP in AM  GASTROINTESTINAL  Lab 12/02/11 0600  AST 22  ALT 27  ALKPHOS 66  BILITOT 0.5  PROT 6.2  ALBUMIN 2.7*   A:  Mild transaminitis, possibly secondary to alcohol use.  Malnutrition. P: -->  GI Px -->  Po diet   HEMATOLOGIC  Lab 12/05/11 0448 12/04/11 0500 12/03/11 0505 12/02/11 0600 11/30/11 0400  HGB 8.8* 8.3* 8.6* 8.7* 9.3*  HCT 28.5* 27.1* 28.4* 28.7* 29.6*  PLT 158 159 173 175 201  INR -- -- -- -- --  APTT -- -- -- -- --   A:  Chronic stable anemia, likely of chronic disease. P: -->  PRBC for hgb < 7gm%  -->  DVT Px -   INFECTIOUS  Lab 12/05/11 0448 12/04/11 0500 12/03/11 0505 12/02/11 0600 11/30/11 0400  WBC 10.6* 13.1* 13.3* 14.4* 19.2*  PROCALCITON -- -- -- -- --   A:  Suspected HCAP, but PCT level speaks against it. P: -->  Narrow abx to Levofloxacin IV 4/5  -->  Cultures as per dashboard --po levaquin 4/9  ENDOCRINE  Lab 12/01/11 1538 12/01/11 1210 12/01/11 0804 12/01/11 0337 11/30/11 2350  GLUCAP 128* 116* 143* 120* 129*   A:  No active issues.  Risk of steroid-induced hyperglycemia. P: -->  CBG checks /  SSI   BEST PRACTICE / DISPOSITION -->  tele status under PCCM  -->  Full code -->  Full diet -->  Heparin [place] for DVT Px -->  Protonix po for GI Px -->  Family is not available  dispo -4/11 change to inhalers, plan for dc 4/12 to home.    Brett Canales Minor ACNP [facility] Pager [phone] till 3 pm If no answer  page [phone]  I have seen and examined this patient with the nurse practionner and agree with the above assessment and plan.  I have made notations to the exceptions.  Shan Levans Beeper  [phone]  Cell  [phone]  If no response or cell goes to voicemail, call beeper [phone]

## 2011-12-05 NOTE — Progress Notes (Signed)
CARE MANAGEMENT NOTE 12/05/2011  Patient:  [name], [name]   Account Number:  [id number]  Date Initiated:  11/28/2011  Documentation initiated by:  Pax Reasoner  Subjective/Objective Assessment:   pt with hx of a.fib with rvr just recently dc to home retruned with resp distress ,failed attempt for bipap required intubation     Action/Plan:   lives at home alone does have friend and family that check in on him   Anticipated DC Date:  12/05/2011   Anticipated DC Plan:  HOME W HOME HEALTH SERVICES      DC Planning Services  CM consult      [place] [facility] Service Choice  HOME HEALTH  DURABLE MEDICAL EQUIPMENT   Choice offered to / List presented to:  C-1 Patient           HH agency  [facility]   Status of service:  In process, will continue to follow Medicare Important Message given?   (If response is "NO", the following Medicare IM given date fields will be blank) Date Medicare IM given:   Date Additional Medicare IM given:    Discharge Disposition:    Per UR Regulation:  Reviewed for med. necessity/level of care/duration of stay  If discussed at Long Length of Stay Meetings, dates discussed:    Comments:  [id number]/[name] Lund,RN,BSN,CCM: Spoke with patient and sister [name], plan is for patient to return to his home with hhc, patient does live alone and sister lives across the street. CSW states in the notes that there is a daughter that will be with the patient but he does not mention this to me.  Will verify this with csw. [id number]/JYNWGN [name] Lewman,RN,BSN,CCM

## 2011-12-05 NOTE — Progress Notes (Signed)
@   Subjective:  Denies CP or dyspnea   Objective:  Filed Vitals:   12/04/11 1446 12/04/11 2003 12/04/11 2059 12/05/11 0454  BP:   123/81 132/88  Pulse:   100 103  Temp:   97.8 F (36.6 C) 97.7 F (36.5 C)  TempSrc:   Oral Oral  Resp:   19 18  Height:      Weight:    152 lb 12.8 oz (69.31 kg)  SpO2: 96% 96% 97% 97%    Intake/Output from previous day:  Intake/Output Summary (Last 24 hours) at 12/05/11 0738 Last data filed at 12/05/11 0458  Gross per 24 hour  Intake    780 ml  Output   2725 ml  Net  -1945 ml    Physical Exam: Physical exam: Well-developed well-nourished in no acute distress.  Skin is warm and dry.  HEENT is normal.  Neck is supple.  Chest with diminished BS throughout and mild rhonchi Cardiovascular exam is irregular Abdominal exam nontender or distended. No masses palpated. Extremities show no edema. neuro grossly intact    Lab Results: Basic Metabolic Panel:  Basename 12/05/11 0448 12/04/11 0500  NA 138 140  K 4.2 4.0  CL 101 102  CO2 29 29  GLUCOSE 109* 109*  BUN 56* 61*  CREATININE 1.66* 1.79*  CALCIUM 8.7 8.4  MG -- 2.0  PHOS -- 3.4   CBC:  Basename 12/05/11 0448 12/04/11 0500  WBC 10.6* 13.1*  NEUTROABS -- --  HGB 8.8* 8.3*  HCT 28.5* 27.1*  MCV 100.0 100.0  PLT 158 159     Assessment/Plan:  1) Atrial fibrillation - HR 90-110; I will not continue digoxin given renal insuff; continue present dose of lopressor;  Increase cardizem to 360 mg daily. Continue ASA; no coumadin given h/o GI bleed. 2) Chronic diastolic CHF - change lasix to 80 mg po daily. 3) Chronic renal insuff-close fu of renal function following DC. 4) COPD - management per primary care 5) Severe MR - not a surgical candidate. Close FU with Dr Daleen Squibb after DC.  Olga Millers 12/05/2011, 7:38 AM

## 2011-12-05 NOTE — Progress Notes (Signed)
CSW had discussed possible home needs/rehab with Pt on 4/9 and Pt stated he preferred to go home to his home or go home with his daughter. Pt appears to have adequate support to assist him at home and PT feels Pt can receive hh pt and int. Supervision. CSW signing off at this time.  Vennie Homans, [place] 8:18 AM 12/05/2011 [phone]

## 2011-12-05 NOTE — Progress Notes (Signed)
[id number] that daughter will be checking in on patient or he will go to stay with her for a short period of time/physical therapy is not advocating for 24/7 care but only if he needs assistance. [facility]CT-[facility] and notified of pending discharge possible with 24 hours.

## 2011-12-06 LAB — BASIC METABOLIC PANEL
BUN: 49 mg/dL — ABNORMAL HIGH (ref 6–23)
CO2: 30 mEq/L (ref 19–32)
Calcium: 8.6 mg/dL (ref 8.4–10.5)
Chloride: 100 mEq/L (ref 96–112)
Creatinine, Ser: 1.55 mg/dL — ABNORMAL HIGH (ref 0.50–1.35)
GFR calc Af Amer: 49 mL/min — ABNORMAL LOW (ref >90)
GFR calc non Af Amer: 42 mL/min — ABNORMAL LOW (ref >90)
Glucose, Bld: 121 mg/dL — ABNORMAL HIGH (ref 70–99)
Potassium: 4.2 mEq/L (ref 3.5–5.1)
Sodium: 138 mEq/L (ref 135–145)

## 2011-12-06 MED ORDER — METOPROLOL TARTRATE 50 MG PO TABS
75.0000 mg | ORAL_TABLET | Freq: Two times a day (BID) | ORAL | Status: DC
  Administered 2011-12-06 – 2011-12-07 (×4): 75 mg via ORAL
  Filled 2011-12-06 (×6): qty 1

## 2011-12-06 NOTE — Progress Notes (Signed)
@   Subjective:  Denies CP or dyspnea   Objective:  Filed Vitals:   12/05/11 2159 12/05/11 2206 12/06/11 0425 12/06/11 0500  BP: 140/80  121/91   Pulse: 92  106   Temp:  97.5 F (36.4 C) 98.7 F (37.1 C)   TempSrc:  Oral Oral   Resp:  18 20   Height:      Weight:    152 lb 12.5 oz (69.3 kg)  SpO2:  96% 95%     Intake/Output from previous day:  Intake/Output Summary (Last 24 hours) at 12/06/11 0804 Last data filed at 12/06/11 0800  Gross per 24 hour  Intake    120 ml  Output   2300 ml  Net  -2180 ml    Physical Exam: Physical exam: Well-developed well-nourished in no acute distress.  Skin is warm and dry.  HEENT is normal.  Neck is supple.  Chest with diminished BS throughout and mild rhonchi Cardiovascular exam is irregular and tachycardic Abdominal exam nontender or distended. No masses palpated. Extremities show no edema. neuro grossly intact    Lab Results: Basic Metabolic Panel:  Basename 12/06/11 0423 12/05/11 0448 12/04/11 0500  NA 138 138 --  K 4.2 4.2 --  CL 100 101 --  CO2 30 29 --  GLUCOSE 121* 109* --  BUN 49* 56* --  CREATININE 1.55* 1.66* --  CALCIUM 8.6 8.7 --  MG -- -- 2.0  PHOS -- -- 3.4   CBC:  Basename 12/05/11 0448 12/04/11 0500  WBC 10.6* 13.1*  NEUTROABS -- --  HGB 8.8* 8.3*  HCT 28.5* 27.1*  MCV 100.0 100.0  PLT 158 159     Assessment/Plan:  1) Atrial fibrillation - HR 100-110; increase lopressor to 75 mg Po BID;  continue cardizem 360 mg daily. Continue ASA; no coumadin given h/o GI bleed. 2) Chronic diastolic CHF - continue lasix 80 mg po daily. 3) Chronic renal insuff-close fu of renal function following DC. 4) COPD - management per primary care 5) Severe MR - not a surgical candidate. Close FU with Dr Daleen Squibb after DC.  Olga Millers 12/06/2011, 8:04 AM

## 2011-12-06 NOTE — Progress Notes (Signed)
Physical Therapy Treatment Patient Details Name: [name] MRN: [id number] DOB: 03-03-1936 Today's Date: 12/06/2011  PT Assessment/Plan  PT - Assessment/Plan Comments on Treatment Session: Pt able to tolerate increased ambulation distance however continues to c/o bilateral knee pain with ambulation.  Pt with slight SOB after ambulation and declined exercises 2* fatigue. PT Plan: Discharge plan remains appropriate;Frequency remains appropriate Follow Up Recommendations: Home health PT;Supervision - Intermittent Equipment Recommended: None recommended by PT PT Goals  Acute Rehab PT Goals PT Goal: Sit to Stand - Progress: Progressing toward goal PT Goal: Stand to Sit - Progress: Progressing toward goal PT Goal: Ambulate - Progress: Progressing toward goal  PT Treatment Precautions/Restrictions  Precautions Precautions: Fall Precaution Comments: oxygen dependent Restrictions Weight Bearing Restrictions: No Mobility (including Balance) Bed Mobility Bed Mobility: No Transfers Transfers: Yes Sit to Stand: 5: Supervision;From chair/3-in-1;With armrests Stand to Sit: 5: Supervision;To chair/3-in-1;With armrests Ambulation/Gait Ambulation/Gait: Yes Ambulation/Gait Assistance: 4: Min assist Ambulation/Gait Assistance Details (indicate cue type and reason): min/guard for safety, continues to c/o knee pain and crepitus with ambulation, ambulated on 2L  and SaO2 94% upon return to room, pt reports slight SOB upon sitting and reports too fatigued to perform exercises Ambulation Distance (Feet): 240 Feet Assistive device: Rolling walker Gait Pattern: Decreased stride length;Trunk flexed    Exercise    End of Session PT - End of Session Equipment Utilized During Treatment: Other (comment) (oxygen) Activity Tolerance: Patient limited by fatigue Patient left: in chair;with call bell in reach General Behavior During Session: [facility] for tasks performed Cognition: [facility] for tasks  performed  [name],[name] 12/06/2011, 4:07 PM Pager: [phone]

## 2011-12-06 NOTE — Progress Notes (Signed)
Name: [name] MRN: [id number] DOB: 04-04-1936    LOS: 9  [place] [facility]      Brief patient profile:  76 yo with GOLD stage IV COPD (on home oxygen 2 lpm), diastolic CHF (EF 13%), pulmonary hypertension, severe TR and recent [facility] admission brought to [facility] ED am 4/4 in respiratory distress likely secondary to acute pulmonary edema. Failed NIMV / BiPAP.  Intubated.  Lines / Drains: 4/3  Foley>>out ETT tube 4/4 >>4/7 Rt CVL>> 4/10  Cultures: 4/3  Blood>>>ng 4/3 U. Strep>>>neg 4/4 U Legionella > neg  4/4 MRSA > neg 4/5 Trach asp >>>nl flora   Antibiotics: 4/3  Cefepime (empiric, HCAP)>>>4/5 4/3  Vancomycin (empiric, HCAP)> 4/5 4/3  Levofloxacin (empiric, HCAP/CAP)>>>4/13  Tests / Events: 4/3  CXR >>>  Pulmonary vascular congestion, possible LLL infiltrate 4/4 CXR>>>Appliances positioned as described and apparent satisfactory location. Persistent cardiac enlargement with pulmonary vascular congestion and interstitial edema.    SUBJECTIVE/OVERNIGHT/INTERVAL HX No chest pain, dyspnea Tele - AF 100-120  Vital Signs: Filed Vitals:   12/05/11 2206 12/06/11 0425 12/06/11 0500 12/06/11 0823  BP:  121/91    Pulse:  106    Temp: 97.5 F (36.4 C) 98.7 F (37.1 C)    TempSrc: Oral Oral    Resp: 18 20    Height:      Weight:   152 lb 12.5 oz (69.3 kg)   SpO2: 96% 95%  95%  FIO2 0.30    Intake/Output Summary (Last 24 hours) at 12/06/11 2440 Last data filed at 12/06/11 0800  Gross per 24 hour  Intake    120 ml  Output   2100 ml  Net  -1980 ml    Physical Examination: General:  Elderly male , awake and alert Neuro:  intact  Neck: No  JVD noted Cardiovascular:  Irregular rhythm, distant heart sounds ,   Lungs:  Even/non-labored  lungs bilaterally coarse Abdomen:  Soft, nontender, nondistended, bowel sounds present, eating Musculoskeletal:  Moves all extremities, no pedal edema Skin:  No rash      Labs and Imaging:  Reviewed. .  No results found.  Lab  12/06/11 0423 12/05/11 0448 12/04/11 0500  NA 138 138 140  K 4.2 4.2 4.0  CL 100 101 102  CO2 30 29 29   BUN 49* 56* 61*  CREATININE 1.55* 1.66* 1.79*  GLUCOSE 121* 109* 109*    Lab 12/05/11 0448 12/04/11 0500 12/03/11 0505  HGB 8.8* 8.3* 8.6*  HCT 28.5* 27.1* 28.4*  WBC 10.6* 13.1* 13.3*  PLT 158 159 173       ASSESSMENT AND PLAN  NEUROLOGIC A:  Encephalopathy secondary to sedation. On 12/01/11 normal mental status P: -4/8 resolved  PULMONARY No results found for this basename: PHART:5,PCO2:5,PCO2ART:5,PO2ART:5,HCO3:5,O2SAT:5 in the last 168 hours A:  Acute hypoxemic / hypercarbic respiratory failure. Metabolic acidosis. Acute pulmonary edema, less likely acute COPD exacerbation or HCAP.  Pulmonary hypertension.  P: -->  extubated 12/01/11 -->  Bronchodilators via nebulizer -->   4/11 change solumedrol  to prednisone. Taper over 2 weeks -->  4/11 resume inhalers in preparation for dc -->stop po levaquin  CARDIOVASCULAR  Lab 12/05/11 0440 12/04/11 0500 12/02/11 0600  TROPONINI -- -- --  LATICACIDVEN -- -- --  PROBNP 8479.0* 6025.0* 10092.0*   A:  Acute CHF (diastolic) exacerbation with acute pulmonary edema.  Atrial fibrillation / controlled rate.  History of HTN.  Initially hypotensive, now resolved.  Severe TR.   - on 11/30/11: A Fib RVR  130 after lopressors and cardizem held. Metoprolol po restarted b ut still A Fib RVR and changed to cardizem gtt -4/8 po dilt started and add low dose bb 4/9 add dig and increase dose of po dilt, stop iv dilt 4/10 cards consult>>dig d/c and dilt dose increased   P: -->off lasix -->  ASA 4/12 cards increased lopressor to 75 mg.. Due to his recent failed dc with quick readmission will keep in house thru weekend per cards agreement.   Lab 12/05/11 0440 12/04/11 0500 12/02/11 0600  PROBNP 8479.0* 6025.0* 10092.0*     RENAL  Lab 12/06/11 0423 12/05/11 0448 12/04/11 0500 12/03/11 0505 12/02/11 0600 12/01/11 0330  NA 138 138 140  142 146* --  K 4.2 4.2 -- -- -- --  CL 100 101 102 101 106 --  CO2 30 29 29 30 30  --  BUN 49* 56* 61* 68* 73* --  CREATININE 1.55* 1.66* 1.79* 2.00* 2.30* --  CALCIUM 8.6 8.7 8.4 8.5 8.9 --  MG -- -- 2.0 -- -- 2.5  PHOS -- -- 3.4 -- -- 3.4   A:  Acute on chronic renal failure / acute kidney injury.  P:  - replete K as needed -->  BMP in AM  GASTROINTESTINAL  Lab 12/02/11 0600  AST 22  ALT 27  ALKPHOS 66  BILITOT 0.5  PROT 6.2  ALBUMIN 2.7*   A:  Mild transaminitis, possibly secondary to alcohol use.  Malnutrition. P: -->  GI Px -->  Po diet   HEMATOLOGIC  Lab 12/05/11 0448 12/04/11 0500 12/03/11 0505 12/02/11 0600 11/30/11 0400  HGB 8.8* 8.3* 8.6* 8.7* 9.3*  HCT 28.5* 27.1* 28.4* 28.7* 29.6*  PLT 158 159 173 175 201  INR -- -- -- -- --  APTT -- -- -- -- --   A:  Chronic stable anemia, likely of chronic disease. P: -->  PRBC for hgb < 7gm%  -->  DVT Px -   INFECTIOUS  Lab 12/05/11 0448 12/04/11 0500 12/03/11 0505 12/02/11 0600 11/30/11 0400  WBC 10.6* 13.1* 13.3* 14.4* 19.2*  PROCALCITON -- -- -- -- --   A:  Suspected HCAP, but PCT level speaks against it. P: -->  Narrow abx to Levofloxacin IV 4/5  --po levaquin 4/9 with stop date in place 4/13  ENDOCRINE  Lab 12/01/11 1538 12/01/11 1210 12/01/11 0804 12/01/11 0337 11/30/11 2350  GLUCAP 128* 116* 143* 120* 129*   A:  No active issues.  Risk of steroid-induced hyperglycemia. P: -->  CBG checks / SSI   BEST PRACTICE / DISPOSITION -->  tele status under PCCM  -->  Full code -->  Full diet -->  Heparin [place] for DVT Px -->  Protonix po for GI Px -->  Family is not available  dispo -4/11 change to inhalers, plan for dc 4/12 to home.             -4/12 keep in house thru weekend.   Brett Canales Minor ACNP [facility] Pager [phone] till 3 pm If no answer page [phone]  Independently examined pt, evaluated data & formulated above care plan with NP  [facility] V.

## 2011-12-07 LAB — PRO B NATRIURETIC PEPTIDE: Pro B Natriuretic peptide (BNP): 5443 pg/mL — ABNORMAL HIGH (ref 0–125)

## 2011-12-07 LAB — BASIC METABOLIC PANEL
CO2: 30 mEq/L (ref 19–32)
Chloride: 100 mEq/L (ref 96–112)
Creatinine, Ser: 1.68 mg/dL — ABNORMAL HIGH (ref 0.50–1.35)
GFR calc Af Amer: 45 mL/min — ABNORMAL LOW (ref >90)
Potassium: 4.2 mEq/L (ref 3.5–5.1)

## 2011-12-07 MED ORDER — DIGOXIN 250 MCG PO TABS
0.2500 mg | ORAL_TABLET | Freq: Every day | ORAL | Status: DC
  Administered 2011-12-07: 0.25 mg via ORAL
  Filled 2011-12-07 (×2): qty 1

## 2011-12-07 NOTE — Progress Notes (Signed)
Name: EKIN PILAR MRN: [id number] DOB: 28-Mar-1936    LOS: 10  [place] [facility]      Brief patient profile:  76 yo with GOLD stage IV COPD (on home oxygen 2 lpm), diastolic CHF (EF 40%), pulmonary hypertension, severe TR and recent hopspital admission brought to [facility] ED am 4/4 in respiratory distress likely secondary to acute pulmonary edema. Failed NIMV / BiPAP.  Intubated.  Lines / Drains: 4/3  Foley>>out ETT tube 4/4 >>4/7 Rt CVL>> 4/10  Cultures: 4/3  Blood>>>ng 4/3 U. Strep>>>neg 4/4 U Legionella > neg  4/4 MRSA > neg 4/5 Trach asp >>>nl flora   Antibiotics: 4/3  Cefepime (empiric, HCAP)>>>4/5 4/3  Vancomycin (empiric, HCAP)> 4/5 4/3  Levofloxacin (empiric, HCAP/CAP)>>>4/13  Tests / Events: 4/3  CXR >>>  Pulmonary vascular congestion, possible LLL infiltrate 4/4 CXR>>>Appliances positioned as described and apparent satisfactory location. Persistent cardiac enlargement with pulmonary vascular congestion and interstitial edema.    SUBJECTIVE/OVERNIGHT/INTERVAL HX No chest pain, dyspnea    Vital Signs: BP 114/65  Pulse 118  Temp(Src) 97.5 F (36.4 C) (Oral)  Resp 18  Ht 5\' 8"  (1.727 m)  Wt 151 lb 6.4 oz (68.675 kg)  BMI 23.02 kg/m2  SpO2 96%  2lpm np  Intake/Output Summary (Last 24 hours) at 12/07/11 1430 Last data filed at 12/07/11 1300  Gross per 24 hour  Intake    960 ml  Output   1315 ml  Net   -355 ml    Physical Examination: General:  Elderly male , awake and alert Neuro:  intact  Neck: No  JVD noted Cardiovascular:  Irregular rhythm, distant heart sounds ,   Lungs:  Even/non-labored  lungs bilaterally coarse Abdomen:  Soft, nontender, nondistended, bowel sounds present, eating Musculoskeletal:  Moves all extremities, no pedal edema Skin:  No rash      Labs and Imaging:      No results found.  Lab 12/07/11 0515 12/06/11 0423 12/05/11 0448  NA 138 138 138  K 4.2 4.2 4.2  CL 100 100 101  CO2 30 30 29   BUN 45* 49* 56*  CREATININE 1.68*  1.55* 1.66*  GLUCOSE 105* 121* 109*    Lab 12/05/11 0448 12/04/11 0500 12/03/11 0505  HGB 8.8* 8.3* 8.6*  HCT 28.5* 27.1* 28.4*  WBC 10.6* 13.1* 13.3*  PLT 158 159 173       ASSESSMENT AND PLAN  NEUROLOGIC A:  Encephalopathy secondary to sedation. On 12/01/11 normal mental status P: -4/8 resolved  PULMONARY No results found for this basename: PHART:5,PCO2:5,PCO2ART:5,PO2ART:5,HCO3:5,O2SAT:5 in the last 168 hours A:  Acute hypoxemic / hypercarbic respiratory failure. Metabolic acidosis. Acute pulmonary edema, less likely acute COPD exacerbation or HCAP.  Pulmonary hypertension.  P: -->  extubated 12/01/11 -->  Bronchodilators via nebulizer -->   4/11 change solumedrol  to prednisone. Taper over 2 weeks -->  4/11 resume inhalers in preparation for dc -->stop po levaquin 4/12  CARDIOVASCULAR  Lab 12/07/11 0513 12/05/11 0440 12/04/11 0500 12/02/11 0600  TROPONINI -- -- -- --  LATICACIDVEN -- -- -- --  PROBNP 5443.0* 8479.0* 6025.0* 10092.0*   A:  Acute CHF (diastolic) exacerbation with acute pulmonary edema.  Atrial fib/controlled rate.  History of HTN.  Initially hypotensive, now resolved.  Severe TR.   - on 4/6 A Fib RVR 130 after lopressors and cardizem held. Metoprolol po restarted but still A Fib RVR and changed to cardizem gtt -4/8 po dilt started and add low dose bb 4/9 add dig and increase  dose of po dilt, stop iv dilt 4/10 cards consulted P: -->off lasix -->  ASA 4/12 cards increased lopressor to 75 mg.. Due to his recent failed dc with quick readmission will keep in house thru weekend per cards agreement. Careful with titrating up lopressor as liable to begin affecting b2 receptors in lung adversely  > prefer bisoprolol in this setting.   Lab 12/07/11 0513 12/05/11 0440 12/04/11 0500  PROBNP 5443.0* 8479.0* 6025.0*     RENAL  Lab 12/07/11 0515 12/06/11 0423 12/05/11 0448 12/04/11 0500 12/03/11 0505 12/01/11 0330  NA 138 138 138 140 142 --  K 4.2 4.2 -- --  -- --  CL 100 100 101 102 101 --  CO2 30 30 29 29 30  --  BUN 45* 49* 56* 61* 68* --  CREATININE 1.68* 1.55* 1.66* 1.79* 2.00* --  CALCIUM 8.5 8.6 8.7 8.4 8.5 --  MG -- -- -- 2.0 -- 2.5  PHOS -- -- -- 3.4 -- 3.4   A:  Acute on chronic renal failure / acute kidney injury.  P:  - replete K as needed -->  BMP in AM  GASTROINTESTINAL  Lab 12/02/11 0600  AST 22  ALT 27  ALKPHOS 66  BILITOT 0.5  PROT 6.2  ALBUMIN 2.7*   A:  Mild transaminitis, possibly secondary to alcohol use.  Malnutrition. P: -->  GI Px -->  Po diet   HEMATOLOGIC  Lab 12/05/11 0448 12/04/11 0500 12/03/11 0505 12/02/11 0600  HGB 8.8* 8.3* 8.6* 8.7*  HCT 28.5* 27.1* 28.4* 28.7*  PLT 158 159 173 175  INR -- -- -- --  APTT -- -- -- --   A:  Chronic stable anemia, likely of chronic disease. P: -->  PRBC for hgb < 7gm%  -->  DVT Px -   INFECTIOUS  Lab 12/05/11 0448 12/04/11 0500 12/03/11 0505 12/02/11 0600  WBC 10.6* 13.1* 13.3* 14.4*  PROCALCITON -- -- -- --   A:  Suspected HCAP, but PCT level speaks against it. P: -->  Narrow abx to Levofloxacin IV 4/5  --po levaquin 4/9 with stop date in place 4/13  ENDOCRINE  Lab 12/01/11 1538 12/01/11 1210 12/01/11 0804 12/01/11 0337 11/30/11 2350  GLUCAP 128* 116* 143* 120* 129*   A:  No active issues.  Risk of steroid-induced hyperglycemia. P: -->  CBG checks / SSI   BEST PRACTICE / DISPOSITION -->  tele status under PCCM  -->  Full code -->  Full diet -->  Heparin [place] for DVT Px -->  Protonix po for GI Px -->  Family is not available  dispo -4/11 change to inhalers, plan for dc 4/12 to home.             -4/12 keep in house thru weekend.    Sandrea Hughs, MD Pulmonary and Critical Care Medicine [facility] Cell [phone]

## 2011-12-07 NOTE — Progress Notes (Signed)
Subjective:  On floor and not currently SOB, no chest pain  Objective:  Vital Signs in the last 24 hours: BP 114/65  Pulse 90  Temp(Src) 97.5 F (36.4 C) (Oral)  Resp 18  Ht 5\' 8"  (1.727 m)  Wt 68.675 kg (151 lb 6.4 oz)  BMI 23.02 kg/m2  SpO2 99%  Physical Exam: Thin BM in NAD Lungs:  Mild rhonchi Cardiac:  Irregularly irregular rhythm, normal S1 and S2, no S3 Abdomen:  Soft, nontender, no masses Extremities:  No edema present  Intake/Output from previous day: 04/12 0701 - 04/13 0700 In: 840 [P.O.:840] Out: 2075 [Urine:2075] Weight change: -0.625 kg (-1 lb 6.1 oz)  Lab Results: Basic Metabolic Panel:  Basename 12/07/11 0515 12/06/11 0423  NA 138 138  K 4.2 4.2  CL 100 100  CO2 30 30  GLUCOSE 105* 121*  BUN 45* 49*  CREATININE 1.68* 1.55*   CBC:  Basename 12/05/11 0448  WBC 10.6*  NEUTROABS --  HGB 8.8*  HCT 28.5*  MCV 100.0  PLT 158   BNP (last 3 results)  Basename 12/07/11 0513 12/05/11 0440 12/04/11 0500  PROBNP 5443.0* 8479.0* 6025.0*   Telemetry: Atrial fibrillation with RVR   Assessment/Plan:  1) Atrial fibrillation - HR 100-110; Add dig to help with rate continue cardizem 360 mg daily. Continue ASA; no coumadin given h/o GI bleed.  2) Chronic diastolic CHF - continue lasix 80 mg po daily.  3) Chronic renal insuff-close fu of renal function following DC.  4) COPD - management per primary care  5) Severe MR - not a surgical candidate.  Darden Palmer  MD [facility] Card[facility]  12/07/2011, 8:56 AM

## 2011-12-08 DIAGNOSIS — N189 Chronic kidney disease, unspecified: Secondary | ICD-10-CM

## 2011-12-08 LAB — BASIC METABOLIC PANEL
BUN: 39 mg/dL — ABNORMAL HIGH (ref 6–23)
CO2: 32 mEq/L (ref 19–32)
Chloride: 100 mEq/L (ref 96–112)
Glucose, Bld: 87 mg/dL (ref 70–99)
Potassium: 5 mEq/L (ref 3.5–5.1)
Sodium: 141 mEq/L (ref 135–145)

## 2011-12-08 LAB — CBC
HCT: 32.6 % — ABNORMAL LOW (ref 39.0–52.0)
Hemoglobin: 10 g/dL — ABNORMAL LOW (ref 13.0–17.0)
MCHC: 30.7 g/dL (ref 30.0–36.0)
RBC: 3.17 MIL/uL — ABNORMAL LOW (ref 4.22–5.81)

## 2011-12-08 MED ORDER — METOPROLOL TARTRATE 100 MG PO TABS
100.0000 mg | ORAL_TABLET | Freq: Two times a day (BID) | ORAL | Status: DC
  Administered 2011-12-08 – 2011-12-09 (×3): 100 mg via ORAL
  Filled 2011-12-08 (×3): qty 1

## 2011-12-08 MED ORDER — PANTOPRAZOLE SODIUM 40 MG PO TBEC
40.0000 mg | DELAYED_RELEASE_TABLET | Freq: Two times a day (BID) | ORAL | Status: DC
  Administered 2011-12-08 – 2011-12-09 (×2): 40 mg via ORAL
  Filled 2011-12-08 (×3): qty 1

## 2011-12-08 MED ORDER — SODIUM CHLORIDE 0.9 % IJ SOLN
3.0000 mL | INTRAMUSCULAR | Status: DC | PRN
  Administered 2011-12-08 – 2011-12-09 (×3): 3 mL via INTRAVENOUS

## 2011-12-08 MED ORDER — DIGOXIN 250 MCG PO TABS
0.2500 mg | ORAL_TABLET | Freq: Two times a day (BID) | ORAL | Status: AC
  Administered 2011-12-08 – 2011-12-09 (×2): 0.25 mg via ORAL
  Filled 2011-12-08 (×2): qty 1

## 2011-12-08 NOTE — Progress Notes (Signed)
Pt had 1.91 sec missed beat, pt is sitting in chair talking on phone. VSS. Will cont to monitor.

## 2011-12-08 NOTE — Progress Notes (Signed)
Name: [name] MRN: [id number] DOB: [birth year]    LOS: 11  [place] [facility]      Brief patient profile:  76 yo with GOLD stage IV COPD (on home oxygen 2 lpm), diastolic CHF (EF 41%), pulmonary hypertension, severe TR and recent hopspital admission brought to [facility] ED am 4/4 in respiratory distress likely secondary to acute pulmonary edema. Failed NIMV / BiPAP.  Intubated.  Lines / Drains: 4/3  Foley>>out ETT tube 4/4 >>4/7 Rt CVL>> 4/10  Cultures: 4/3  Blood>>>neg 4/3 U. Strep>>>neg 4/4 U Legionella > neg  4/4 MRSA > neg 4/5 Trach asp >>>nl flora   Antibiotics: 4/3  Cefepime (empiric, HCAP)>>>4/5 4/3  Vancomycin (empiric, HCAP)> 4/5 4/3  Levofloxacin (empiric, HCAP/CAP)>>>4/13  Tests / Events: 4/3  CXR >>>  Pulmonary vascular congestion, possible LLL infiltrate 4/4 CXR>>>Appliances positioned as described and apparent satisfactory location. Persistent cardiac enlargement with pulmonary vascular congestion and interstitial edema.    SUBJECTIVE/OVERNIGHT/INTERVAL HX No chest pain, dyspnea    Vital Signs: BP 112/72  Pulse 102  Temp(Src) 97.8 F (36.6 C) (Oral)  Resp 18  Ht 5\' 8"  (1.727 m)  Wt 150 lb 5.7 oz (68.2 kg)  BMI 22.86 kg/m2  SpO2 98%  2lpm np  Intake/Output Summary (Last 24 hours) at 12/08/11 1127 Last data filed at 12/08/11 0900  Gross per 24 hour  Intake   1083 ml  Output   1770 ml  Net   -687 ml    Physical Examination: General:  Elderly male , awake and alert Neuro:  intact  Neck: No  JVD noted Cardiovascular:  Irregular rhythm, distant heart sounds ,   Lungs:  Even/non-labored  lungs bilaterally coarse Abdomen:  Soft, nontender, nondistended, bowel sounds present, eating Musculoskeletal:  Moves all extremities, no pedal edema Skin:  No rash      Labs and Imaging:      No results found.  Lab 12/08/11 0540 12/07/11 0515 12/06/11 0423  NA 141 138 138  K 5.0 4.2 4.2  CL 100 100 100  CO2 32 30 30  BUN 39* 45* 49*  CREATININE 1.60*  1.68* 1.55*  GLUCOSE 87 105* 121*    Lab 12/08/11 0540 12/05/11 0448 12/04/11 0500  HGB 10.0* 8.8* 8.3*  HCT 32.6* 28.5* 27.1*  WBC 12.9* 10.6* 13.1*  PLT 173 158 159       ASSESSMENT AND PLAN  NEUROLOGIC A:  Encephalopathy secondary to sedation. On 12/01/11 normal mental status P: -4/8 resolved  PULMONARY No results found for this basename: PHART:5,PCO2:5,PCO2ART:5,PO2ART:5,HCO3:5,O2SAT:5 in the last 168 hours A:  Acute hypoxemic / hypercarbic respiratory failure. Metabolic acidosis. Acute pulmonary edema, less likely acute COPD exacerbation or HCAP.  Pulmonary hypertension.  P: -->  extubated 12/01/11 -->  Bronchodilators via nebulizer -->   4/11 change solumedrol  to prednisone. Taper over 2 weeks -->  4/11 resumed inhalers in preparation for dc -->stopped po levaquin 4/12  CARDIOVASCULAR  Lab 12/07/11 0513 12/05/11 0440 12/04/11 0500 12/02/11 0600  TROPONINI -- -- -- --  LATICACIDVEN -- -- -- --  PROBNP 5443.0* 8479.0* 6025.0* 10092.0*   A:  Acute CHF (diastolic) exacerbation with acute pulmonary edema.  Atrial fib/controlled rate.  History of HTN.  Initially hypotensive, now resolved.  Severe TR.   - on 4/6 A Fib RVR 130 after lopressors and cardizem held. Metoprolol po restarted but still A Fib RVR and changed to cardizem gtt -4/8 po dilt started and add low dose bb 4/9 add dig and increase dose  of po dilt, stop iv dilt 4/10 cards consulted P: -->  ASA 4/12 cards increased lopressor to 75 mg.. Due to his recent failed dc with quick readmission will keep in house thru weekend per cards agreement. Careful with titrating up lopressor as liable to begin affecting b2 receptors in lung adversely  > prefer bisoprolol in this setting.   Lab 12/07/11 0513 12/05/11 0440 12/04/11 0500  PROBNP 5443.0* 8479.0* 6025.0*     RENAL  Lab 12/08/11 0540 12/07/11 0515 12/06/11 0423 12/05/11 0448 12/04/11 0500  NA 141 138 138 138 140  K 5.0 4.2 -- -- --  CL 100 100 100 101 102    CO2 32 30 30 29 29   BUN 39* 45* 49* 56* 61*  CREATININE 1.60* 1.68* 1.55* 1.66* 1.79*  CALCIUM 9.0 8.5 8.6 8.7 8.4  MG -- -- -- -- 2.0  PHOS -- -- -- -- 3.4   A:  Acute on chronic renal failure / acute kidney injury.  P: appears to have stabilized around creat 1.6  Vs baseline 1.8  10/12/11  GASTROINTESTINAL  Lab 12/02/11 0600  AST 22  ALT 27  ALKPHOS 66  BILITOT 0.5  PROT 6.2  ALBUMIN 2.7*   A:  Mild transaminitis, possibly secondary to alcohol use.  Malnutrition. P: Resolved  HEMATOLOGIC  Lab 12/08/11 0540 12/05/11 0448 12/04/11 0500 12/03/11 0505 12/02/11 0600  HGB 10.0* 8.8* 8.3* 8.6* 8.7*  HCT 32.6* 28.5* 27.1* 28.4* 28.7*  PLT 173 158 159 173 175  INR -- -- -- -- --  APTT -- -- -- -- --   A:  Chronic stable anemia, likely of chronic disease. P: -->  PRBC for hgb < 7gm%  -->  On DVT Px with sq hep    INFECTIOUS  Lab 12/08/11 0540 12/05/11 0448 12/04/11 0500 12/03/11 0505 12/02/11 0600  WBC 12.9* 10.6* 13.1* 13.3* 14.4*  PROCALCITON -- -- -- -- --   A:  Suspected HCAP, but PCT level speaks against it. P: -->  Narrow abx to Levofloxacin IV 4/5  --po levaquin 4/9 > stopped 4/13  ENDOCRINE  Lab 12/01/11 1538 12/01/11 1210  GLUCAP 128* 116*   A:  No active issues.  Risk of steroid-induced hyperglycemia. P: -->  CBG checks / SSI   BEST PRACTICE / DISPOSITION -->  tele status under PCCM  -->  Full code -->  Full diet -->  Heparin [place] for DVT Px      dispo -4/11 change to inhalers, plan for dc 4/12 to home.             -4/12 keep in house thru weekend.    Sandrea Hughs, MD Pulmonary and Critical Care Medicine [facility] Cell [phone]

## 2011-12-08 NOTE — Progress Notes (Signed)
Subjective:  No SOB or chest pain.  In chair at bedside.  Mental status improved. Objective:  Vital Signs in the last 24 hours: BP 112/72  Pulse 102  Temp(Src) 97.8 F (36.6 C) (Oral)  Resp 18  Ht 5\' 8"  (1.727 m)  Wt 68.2 kg (150 lb 5.7 oz)  BMI 22.86 kg/m2  SpO2 98%  Physical Exam: Thin BM in NAD Lungs:  Mild rhonchi Cardiac:  Irregularly irregular rhythm, normal S1 and S2, no S3, 2/6 systolic murmur Abdomen:  Soft, nontender, no masses Extremities:  No edema present  Intake/Output from previous day: 04/13 0701 - 04/14 0700 In: 1443 [P.O.:1440; I.V.:3] Out: 1645 [Urine:1645] Filed Weights   12/06/11 0500 12/07/11 0447 12/08/11 0506  Weight: 69.3 kg (152 lb 12.5 oz) 68.675 kg (151 lb 6.4 oz) 68.2 kg (150 lb 5.7 oz)    Lab Results: Basic Metabolic Panel:  Basename 12/08/11 0540 12/07/11 0515  NA 141 138  K 5.0 4.2  CL 100 100  CO2 32 30  GLUCOSE 87 105*  BUN 39* 45*  CREATININE 1.60* 1.68*   CBC:  Basename 12/08/11 0540  WBC 12.9*  NEUTROABS --  HGB 10.0*  HCT 32.6*  MCV 102.8*  PLT 173   BNP (last 3 results)  Basename 12/07/11 0513 12/05/11 0440 12/04/11 0500  PROBNP 5443.0* 8479.0* 6025.0*   Telemetry: Atrial fibrillation with RVR   Assessment/Plan:  1) Atrial fibrillation - HR 100-110; increase beta blocker.  Continue ASA; no coumadin given h/o GI bleed.  2) Chronic diastolic CHF - continue lasix 80 mg po daily.  3) Chronic renal insuf  improving 4) COPD - management per primary care  5) Severe MR - not a surgical candidate.  Darden Palmer  MD [facility]  12/08/2011, 9:03 AM

## 2011-12-09 ENCOUNTER — Encounter: Payer: Medicare Other | Admitting: Physician Assistant

## 2011-12-09 DIAGNOSIS — I4891 Unspecified atrial fibrillation: Secondary | ICD-10-CM

## 2011-12-09 DIAGNOSIS — J9620 Acute and chronic respiratory failure, unspecified whether with hypoxia or hypercapnia: Secondary | ICD-10-CM

## 2011-12-09 DIAGNOSIS — J189 Pneumonia, unspecified organism: Secondary | ICD-10-CM

## 2011-12-09 DIAGNOSIS — I5033 Acute on chronic diastolic (congestive) heart failure: Secondary | ICD-10-CM

## 2011-12-09 DIAGNOSIS — J440 Chronic obstructive pulmonary disease with acute lower respiratory infection: Secondary | ICD-10-CM

## 2011-12-09 DIAGNOSIS — J209 Acute bronchitis, unspecified: Secondary | ICD-10-CM

## 2011-12-09 MED ORDER — ASPIRIN 325 MG PO TBEC: 325.0000 mg | DELAYED_RELEASE_TABLET | Freq: Every day | ORAL | Status: AC

## 2011-12-09 MED ORDER — METOPROLOL TARTRATE 100 MG PO TABS: 100.0000 mg | ORAL_TABLET | Freq: Two times a day (BID) | ORAL | Status: DC

## 2011-12-09 MED ORDER — PREDNISONE 20 MG PO TABS: ORAL_TABLET | ORAL | Status: DC

## 2011-12-09 MED ORDER — POTASSIUM CHLORIDE CRYS ER 20 MEQ PO TBCR: 20.0000 meq | EXTENDED_RELEASE_TABLET | Freq: Every day | ORAL | Status: DC

## 2011-12-09 MED ORDER — DILTIAZEM HCL ER COATED BEADS 360 MG PO CP24: 360.0000 mg | ORAL_CAPSULE | Freq: Every day | ORAL | Status: DC

## 2011-12-09 NOTE — Progress Notes (Signed)
   CARE MANAGEMENT NOTE 12/09/2011  Patient:  [name], [name]   Account Number:  [id number]  Date Initiated:  11/28/2011  Documentation initiated by:  [name],[name]  Subjective/Objective Assessment:   pt with hx of a.fib with rvr just recently dc to home retruned with resp distress ,failed attempt for bipap required intubation     Action/Plan:   lives at home alone does have friend and family that check in on him   Anticipated DC Date:  12/11/2011   Anticipated DC Plan:  HOME W HOME HEALTH SERVICES      DC Planning Services  CM consult  Patient refused services      [facility]  HOME HEALTH  DURABLE MEDICAL EQUIPMENT   Choice offered to / List presented to:  C-1 Patient   DME arranged  [facility]      DME agency  [facility]     [facility] arranged  HH-1 RN      [facility] agency  [facility]   Status of service:  In process, will continue to follow Medicare Important Message given?   (If response is "NO", the following Medicare IM given date fields will be blank) Date Medicare IM given:   Date Additional Medicare IM given:    Discharge Disposition:    Per UR Regulation:  Reviewed for med. necessity/level of care/duration of stay  If discussed at Long Length of Stay Meetings, dates discussed:    Comments:  12/09/11 [name] Gustafson RN,BSN NCM [phone] PATIENT ONLY AGREEING TO HHRN,EVEN THOUGH INFORMED THAT PT-RECOMMENDED HH ALSO.[facility] HHRN-SAFETY & MEDS INMOTION.[facility] FOLLOWING FOR HHC-RN.HOME 02 TO BE RESUMED.[facility] DME WILL BRING A TRAVEL 02 TANK TO PATIENT'S RM.SPOKE TO HIS SISTER LORRAINE AWARE OF PATIENT'S CHOICE TO HOME W/ONLY HHRN.FAMILY WILL BE ABLE TO TRANSPORT HOME @ D/C.WILL NEED HHRN ORDER.  [id number] [name] Plater RN,BSN,CCM Case Management [phone]   [id number] [name],RN,BSN,CCM: Spoke with patient and sister [name], plan is for patient to return to his home with hhc, patient does live alone and sister lives across the street. CSW states  in the notes that there is a daughter that will be with the patient but he does not mention this to me.  Will verify this with csw. [id number] [name],RN,BSN,CCM

## 2011-12-09 NOTE — Progress Notes (Signed)
Name: [name] MRN: [id number] DOB: 1935/10/12    LOS: 12  [place] [facility]      Brief patient profile:  76 yo with GOLD stage IV COPD (on home oxygen 2 lpm), diastolic CHF (EF 82%), pulmonary hypertension, severe TR and recent hopspital admission brought to [facility] ED am 4/4 in respiratory distress likely secondary to acute pulmonary edema. Failed NIMV / BiPAP.  Intubated.  Lines / Drains: 4/3  Foley>>out ETT tube 4/4 >>4/7 Rt CVL>> 4/10  Cultures: 4/3  Blood>>>neg 4/3 U. Strep>>>neg 4/4 U Legionella > neg  4/4 MRSA > neg 4/5 Trach asp >>>nl flora  Antibiotics: 4/3  Cefepime (empiric, HCAP)>>>4/5 4/3  Vancomycin (empiric, HCAP)> 4/5 4/3  Levofloxacin (empiric, HCAP/CAP)>>>4/13  Tests / Events: 4/3  CXR >>>  Pulmonary vascular congestion, possible LLL infiltrate 4/4 CXR>>>Appliances positioned as described and apparent satisfactory location. Persistent cardiac enlargement with pulmonary vascular congestion and interstitial edema.  SUBJECTIVE/OVERNIGHT/INTERVAL HX No chest pain, dyspnea    Vital Signs: BP 117/79  Pulse 92  Temp(Src) 97.8 F (36.6 C) (Oral)  Resp 20  Ht 5\' 8"  (1.727 m)  Wt 148 lb 13 oz (67.5 kg)  BMI 22.63 kg/m2  SpO2 97%  2lpm np  Intake/Output Summary (Last 24 hours) at 12/09/11 1233 Last data filed at 12/09/11 0801  Gross per 24 hour  Intake   1118 ml  Output   2225 ml  Net  -1107 ml    Physical Examination: General:  Elderly male , awake and alert Neuro:  intact  Neck: No  JVD noted Cardiovascular:  Irregular rhythm, distant heart sounds ,   Lungs:  Even/non-labored  lungs bilaterally coarse Abdomen:  Soft, nontender, nondistended, bowel sounds present, eating Musculoskeletal:  Moves all extremities, no pedal edema Skin:  No rash  Labs and Imaging:        Lab 12/08/11 0540 12/07/11 0515 12/06/11 0423  NA 141 138 138  K 5.0 4.2 4.2  CL 100 100 100  CO2 32 30 30  BUN 39* 45* 49*  CREATININE 1.60* 1.68* 1.55*  GLUCOSE 87 105* 121*      Lab 12/08/11 0540 12/05/11 0448 12/04/11 0500  HGB 10.0* 8.8* 8.3*  HCT 32.6* 28.5* 27.1*  WBC 12.9* 10.6* 13.1*  PLT 173 158 159       ASSESSMENT AND PLAN  Acute on chronic respiratory failure with hypoxia/hypercapnia in setting of GOLD 4 COPD with acute exacerbation, and acute pulmonary edema -Taper prednisone off over next two weeks -continue bronchodilators -completed Abx -continue supplemental oxygen 24/7  Acute on chronic diastolic CHF with acute pulmonary edema, A fib with RVR, HTN, severe TR -per cardiology -continue ASA, cardizem CD, lasix, metoprolol  Metabolic encephalopathy secondary to hypercapnia and sedation -resolved  Acute on chronic renal failure / acute kidney injury -resolved  Anemia of chronic disease -f/u as outpt  Disposition -pt refused home health RN -has outpt f/u with Dr. Juanito Doom with [facility] -has outpt f/u with Dr. Marcelyn Bruins with [facility]    Brett Canales Minor ACNP [facility] Pager [phone] till 3 pm If no answer page [phone] 12/09/2011, 12:33 PM  Reviewed above, examined pt, and agree with assessment/plan.  He is to be d/c home today.  Follow up appointments have been made.  Concerned that compliance with medical regimen may be an issue for his outpt therapy.  Coralyn Helling, MD 12/09/2011, 2:43 PM Pager:  [phone]

## 2011-12-09 NOTE — Discharge Summary (Addendum)
Physician Discharge Summary  Patient ID: [name] MRN: [id number] DOB/AGE: 1936-04-09 76 y.o.  Admit date: 11/27/2011 Discharge date: 12/09/2011  Problem List Active Problems:  Acute and chronic respiratory failure with hypercapnia  Acute respiratory failure with hypoxia  Acute pulmonary edema with congestive heart failure  Pneumonia due to infectious agent  Acute exacerbation of chronic obstructive pulmonary disease (COPD)  Acute renal failure  Encephalopathy acute HPI: 76 yo with GOLD stage IV COPD (on home oxygen 2 lpm), diastolic CHF (EF 40%), pulmonary hypertension, severe TR and recent hopspital admission brought to [facility] ED in respiratory distress likely secondary to acute pulmonary edema. Failed NIMV / BiPAP. Intubated.  Hospital Course:  Lines / Drains:  4/3 Foley>>out  ETT tube 4/4 >>4/7  Rt CVL>> 4/10  Cultures:  4/3 Blood>>>neg  4/3 U. Strep>>>neg  4/4 U Legionella > neg  4/4 MRSA > neg  4/5 Trach asp >>>nl flora  Antibiotics:  4/3 Cefepime (empiric, HCAP)>>>4/5  4/3 Vancomycin (empiric, HCAP)> 4/5  4/3 Levofloxacin (empiric, HCAP/CAP)>>>4/13  Tests / Events:  4/3 CXR >>> Pulmonary vascular congestion, possible LLL infiltrate  4/4 CXR>>>Appliances positioned as described and apparent satisfactory location. Persistent cardiac enlargement with pulmonary vascular congestion and interstitial edema.  SUBJECTIVE/OVERNIGHT/INTERVAL HX  No chest pain, dyspnea  Vital Signs:  BP 117/79  Pulse 92  Temp(Src) 97.8 F (36.6 C) (Oral)  Resp 20  Ht 5\' 8"  (1.727 m)  Wt 148 lb 13 oz (67.5 kg)  BMI 22.63 kg/m2  SpO2 97% 2lpm np   Intake/Output Summary (Last 24 hours) at 12/09/11 1233 Last data filed at 12/09/11 0801   Gross per 24 hour   Intake  1118 ml   Output  2225 ml   Net  -1107 ml    Physical Examination:  General: Elderly male , awake and alert  Neuro: intact  Neck: No JVD noted  Cardiovascular: Irregular rhythm, distant heart sounds ,  Lungs:  Even/non-labored lungs bilaterally coarse  Abdomen: Soft, nontender, nondistended, bowel sounds present, eating  Musculoskeletal: Moves all extremities, no pedal edema  Skin: No rash   Labs and Imaging:  No results found.   Lab  12/08/11 0540  12/07/11 0515  12/06/11 0423   NA  141  138  138   K  5.0  4.2  4.2   CL  100  100  100   CO2  32  30  30   BUN  39*  45*  49*   CREATININE  1.60*  1.68*  1.55*   GLUCOSE  87  105*  121*     Lab  12/08/11 0540  12/05/11 0448  12/04/11 0500   HGB  10.0*  8.8*  8.3*   HCT  32.6*  28.5*  27.1*   WBC  12.9*  10.6*  13.1*   PLT  173  158  159    ASSESSMENT AND PLAN  NEUROLOGIC  A: Encephalopathy secondary to sedation. On 12/01/11 normal mental status  P:  -4/8 resolved  PULMONARY  No results found for this basename: PHART:5,PCO2:5,PCO2ART:5,PO2ART:5,HCO3:5,O2SAT:5 in the last 168 hours  A: Acute hypoxemic / hypercarbic respiratory failure. Metabolic acidosis. Acute pulmonary edema, less likely acute COPD exacerbation or HCAP. Pulmonary hypertension.  P:  --> extubated 12/01/11  --> Bronchodilators via nebulizer  --> 4/11 change solumedrol to prednisone. Taper over 2 weeks  --> 4/11 resumed inhalers in preparation for dc  -->stopped po levaquin 4/12  CARDIOVASCULAR   Lab  12/07/11 0513  12/05/11 0440  12/04/11 0500   TROPONINI  --  --  --   LATICACIDVEN  --  --  --   PROBNP  5443.0*  8479.0*  6025.0*    A: Acute CHF (diastolic) exacerbation with acute pulmonary edema. Atrial fib/controlled rate. History of HTN. Initially hypotensive, now resolved. Severe TR.  - on 4/6 A Fib RVR 130 after lopressors and cardizem held. Metoprolol po restarted but still A Fib RVR and changed to cardizem gtt  -4/8 po dilt started and add low dose bb  4/9 add dig and increase dose of po dilt, stop iv dilt  4/10 cards consulted  P:  --> ASA  4/12 cards increased lopressor to 75 mg.. Due to his recent failed dc with quick readmission will keep in house thru  weekend per cards agreement.  Careful with titrating up lopressor as liable to begin affecting b2 receptors in lung adversely > prefer bisoprolol in this setting.   Lab  12/07/11 0513  12/05/11 0440  12/04/11 0500   PROBNP  5443.0*  8479.0*  6025.0*    RENAL   Lab  12/08/11 0540  12/07/11 0515  12/06/11 0423  12/05/11 0448  12/04/11 0500   NA  141  138  138  138  140   K  5.0  4.2  --  --  --   CL  100  100  100  101  102   CO2  32  30  30  29  29    BUN  39*  45*  49*  56*  61*   CREATININE  1.60*  1.68*  1.55*  1.66*  1.79*   CALCIUM  9.0  8.5  8.6  8.7  8.4   MG  --  --  --  --  2.0   PHOS  --  --  --  --  3.4    A: Acute on chronic renal failure / acute kidney injury.  P: appears to have stabilized around creat 1.6 Vs baseline 1.8 10/12/11  GASTROINTESTINAL  No results found for this basename: AST:5,ALT:5,ALKPHOS:5,BILITOT:5,PROT:5,ALBUMIN:5 in the last 168 hours  A: Mild transaminitis, possibly secondary to alcohol use. Malnutrition.  P:  Resolved  HEMATOLOGIC   Lab  12/08/11 0540  12/05/11 0448  12/04/11 0500  12/03/11 0505   HGB  10.0*  8.8*  8.3*  8.6*   HCT  32.6*  28.5*  27.1*  28.4*   PLT  173  158  159  173   INR  --  --  --  --   APTT  --  --  --  --    A: Chronic stable anemia, likely of chronic disease.  P:  --> PRBC for hgb < 7gm%  --> On DVT Px with sq hep  INFECTIOUS   Lab  12/08/11 0540  12/05/11 0448  12/04/11 0500  12/03/11 0505   WBC  12.9*  10.6*  13.1*  13.3*   PROCALCITON  --  --  --  --    A: Suspected HCAP, but PCT level speaks against it.  P:  --> Narrow abx to Levofloxacin IV 4/5  --po levaquin 4/9 > stopped 4/13  ENDOCRINE  No results found for this basename: GLUCAP:5 in the last 168 hours  A: No active issues. Risk of steroid-induced hyperglycemia.  P:  --> CBG checks / SSI  BEST PRACTICE / DISPOSITION  --> tele status under PCCM  --> Full code  --> Full diet  --> Heparin [place] for DVT  Px  dispo -4/11 change to inhalers, plan for dc  4/12 to home.  -4/12 keep in house thru weekend.  -4/15 dc home. Refused HHRN. Has appointment with Juanito Doom. I suspect he will be back due to non compliance.  -Follow up with Dr. Shelle Iron.       Labs at discharge Lab Results  Component Value Date   CREATININE 1.60* 12/08/2011   BUN 39* 12/08/2011   NA 141 12/08/2011   K 5.0 12/08/2011   CL 100 12/08/2011   CO2 32 12/08/2011   Lab Results  Component Value Date   WBC 12.9* 12/08/2011   HGB 10.0* 12/08/2011   HCT 32.6* 12/08/2011   MCV 102.8* 12/08/2011   PLT 173 12/08/2011   Lab Results  Component Value Date   ALT 27 12/02/2011   AST 22 12/02/2011   ALKPHOS 66 12/02/2011   BILITOT 0.5 12/02/2011   Lab Results  Component Value Date   INR 1.38 11/28/2011   INR 1.36 11/17/2011   INR 1.14 05/30/2011    Current radiology studies No results found.  Disposition:  01-Home or Self Care  Discharge Orders    Future Appointments: Provider: Department: Dept Phone: Center:   12/24/2011 10:45 AM Barbaraann Share, MD [facility] [phone] None   01/30/2012 11:45 AM Sherren Kerns, MD [facility] [phone] [facility]     Future Orders Please Complete By Expires   Discharge patient        Medication List  As of 12/09/2011 12:43 PM   STOP taking these medications         amLODipine 10 MG tablet      cephALEXin 500 MG capsule      hydrALAZINE 25 MG tablet         TAKE these medications         albuterol 108 (90 BASE) MCG/ACT inhaler   Commonly known as: PROVENTIL HFA;VENTOLIN HFA   Inhale 2 puffs into the lungs every 6 (six) hours as needed. For shortness of breath      albuterol 2 MG tablet   Commonly known as: PROVENTIL   Take 2 mg by mouth 3 (three) times daily.      aspirin 325 MG EC tablet   Take 1 tablet (325 mg total) by mouth daily.      budesonide-formoterol 160-4.5 MCG/ACT inhaler   Commonly known as: SYMBICORT   Inhale 2 puffs into the lungs 2 (two) times daily.      diltiazem 360 MG 24 hr capsule   Commonly known  as: CARDIZEM CD   Take 1 capsule (360 mg total) by mouth daily.      LASIX 80 MG tablet   Generic drug: furosemide   Take 1 tablet (80 mg total) by mouth daily.      metoprolol 100 MG tablet   Commonly known as: LOPRESSOR   Take 1 tablet (100 mg total) by mouth 2 (two) times daily.      pantoprazole 40 MG tablet   Commonly known as: PROTONIX   Take 40 mg by mouth 2 (two) times daily.      potassium chloride SA 20 MEQ tablet   Commonly known as: K-DUR,KLOR-CON   Take 1 tablet (20 mEq total) by mouth daily.      predniSONE 20 MG tablet   Commonly known as: DELTASONE   Take 4 tabs  daily with food x 4 days, then 3 tabs daily x 4 days, then 2 tabs daily x 4 days,  then 1 tab daily x4 days then stop. #40      tiotropium 18 MCG inhalation capsule   Commonly known as: SPIRIVA   Place 18 mcg into inhaler and inhale daily.           Follow-up Information    Follow up with [facility]. (HHRN-MEDS, & SAFETY IN MOTION;RESUME HOME 02)    Contact information:   [address] [place], [place] [phone]      Call Valera Castle, MD. (Dr Vern Claude office will call you.)    Contact information:   [address] [place] [address] [phone]       Follow up with Barbaraann Share, MD on 12/24/2011. (10:45 am)    Contact information:   [address] [employer] [place] [address] [phone]          Discharged Condition: good  Signed: Brett Canales Minor ACNP [facility] PCCM Pager [phone] till 3 pm If no answer page [phone] 12/09/2011, 12:43 PM  Coralyn Helling, MD 12/09/2011, 2:44 PM  Final diagnoses: GOLD 4 COPD with acute exacerbation Acute on chronic respiratory failure Pneumonia Acute on chronic diastolic congestive heart failure Acute pulmonary edema Atrial fibrillation with rapid ventricular response Metabolic encephalopathy due to hypercapnia Acute on chronic renal  failure  Coralyn Helling, MD 12/11/2011, 12:44 PM  Secondary pulmonary hypertension

## 2011-12-09 NOTE — Progress Notes (Signed)
PROGRESS NOTE  Subjective:   [name] is a 76 yo with A-fib, severe MR, mild AI,  Mod. TR, severe Pulmonary hypertension, COPD.  Recently had bradycardia but then was  Admitted with A-Fib with RVR.  CT angio was negative for PE but did show aneurismal dilation of the ascending aorta.   Objective:    Vital Signs:   Temp:  [97.7 F (36.5 C)-97.9 F (36.6 C)] 97.8 F (36.6 C) (04/15 0420) Pulse Rate:  [99-103] 99  (04/15 0420) Resp:  [18-20] 20  (04/15 0420) BP: (103-117)/(54-79) 117/79 mmHg (04/15 0420) SpO2:  [96 %-98 %] 97 % (04/15 0420) Weight:  [148 lb 13 oz (67.5 kg)] 148 lb 13 oz (67.5 kg) (04/15 0540)  Last BM Date: 12/08/11   24-hour weight change: Weight change: -1 lb 8.7 oz (-0.7 kg)  Weight trends: Filed Weights   12/07/11 0447 12/08/11 0506 12/09/11 0540  Weight: 151 lb 6.4 oz (68.675 kg) 150 lb 5.7 oz (68.2 kg) 148 lb 13 oz (67.5 kg)    Intake/Output:  04/14 0701 - 04/15 0700 In: 878 [P.O.:875; I.V.:3] Out: 2350 [Urine:2350]     Physical Exam: BP 117/79  Pulse 99  Temp(Src) 97.8 F (36.6 C) (Oral)  Resp 20  Ht 5\' 8"  (1.727 m)  Wt 148 lb 13 oz (67.5 kg)  BMI 22.63 kg/m2  SpO2 97%  General: Vital signs reviewed and noted. Thin, chronically ill appearing.  Head: Normocephalic, atraumatic.  Eyes: conjunctivae/corneas clear. PERRL, EOM's intact. Fundi benign.  Throat: Oropharynx nonerythematous, no exudate appreciated.   Neck: Supple. Normal carotids. No JVD  Lungs:  Bilateral wheezing  Heart: Irregularly irregular, 3/6 systolic murmur at LSB radiating to axilla  Abdomen:  Soft, non-tender, non-distended with normoactive bowel sounds. No hepatomegaly. No rebound/guarding. No abdominal masses.  Extremities: No edema.  Distal pedal pulses are 2+ and equal bilaterally.  Neurologic: A&O X3, CN II - XII are grossly intact. Motor strength is 5/5 in the all 4 extremities.  Psych: Responds to questions appropriately with normal affect.     Labs: BMET:  Basename 12/08/11 0540 12/07/11 0515  NA 141 138  K 5.0 4.2  CL 100 100  CO2 32 30  GLUCOSE 87 105*  BUN 39* 45*  CREATININE 1.60* 1.68*  CALCIUM 9.0 8.5  MG -- --  PHOS -- --    Liver function tests: No results found for this basename: AST:2,ALT:2,ALKPHOS:2,BILITOT:2,PROT:2,ALBUMIN:2 in the last 72 hours No results found for this basename: LIPASE:2,AMYLASE:2 in the last 72 hours  CBC:  Basename 12/08/11 0540  WBC 12.9*  NEUTROABS --  HGB 10.0*  HCT 32.6*  MCV 102.8*  PLT 173    Cardiac Enzymes: No results found for this basename: CKTOTAL:4,CKMB:4,TROPONINI:4 in the last 72 hours  Coagulation Studies: No results found for this basename: LABPROT:5,INR:5 in the last 72 hours  Other:   Tele:  A-fib with v-rates 90-100  Medications:    Infusions:    . sodium chloride 500 mL (12/02/11 1058)    Scheduled Medications:    . antiseptic oral rinse  1 application Mouth Rinse QID  . aspirin  325 mg Oral Daily  . budesonide-formoterol  2 puff Inhalation BID  . chlorhexidine  15 mL Mouth/Throat BID  . digoxin  0.25 mg Oral BID  . diltiazem  360 mg Oral Daily  . furosemide  80 mg Oral Daily  . heparin subcutaneous  5,000 Units Subcutaneous Q8H  . metoprolol tartrate  100 mg Oral BID  .  pantoprazole  40 mg Oral BID AC  . potassium chloride  40 mEq Oral Daily  . predniSONE  40 mg Oral Q breakfast  . tiotropium  18 mcg Inhalation Daily  . DISCONTD: digoxin  0.25 mg Oral Daily  . DISCONTD: metoprolol tartrate  75 mg Oral BID    Assessment/ Plan:    1. A-fib:  His rate is still a bit higher than we would like but he is on near max meds.  I suspect we are close to his baseline.  His HR will likely slow slightly as he improves in general.   following  Length of Stay: 12  Vesta Mixer, Montez Hageman., MD, [facility] 12/09/2011, 7:02 AM

## 2011-12-09 NOTE — Progress Notes (Signed)
PT Cancellation Note  Treatment cancelled today due to patient's refusal to participate due to planning to discharge today and wants to safe his strength for going home.  Imelda Dandridge,CYNDI 12/09/2011, 5:14 PM

## 2011-12-11 ENCOUNTER — Other Ambulatory Visit: Payer: Self-pay | Admitting: *Deleted

## 2011-12-11 DIAGNOSIS — I714 Abdominal aortic aneurysm, without rupture: Secondary | ICD-10-CM

## 2011-12-11 DIAGNOSIS — Z48812 Encounter for surgical aftercare following surgery on the circulatory system: Secondary | ICD-10-CM

## 2011-12-12 ENCOUNTER — Telehealth: Payer: Self-pay | Admitting: Pulmonary Disease

## 2011-12-12 NOTE — Telephone Encounter (Signed)
Spoke to [place] [facility] and she states this was questions for pt's primary dr Luciana Axe and she has already called them

## 2011-12-17 ENCOUNTER — Telehealth: Payer: Self-pay | Admitting: Pulmonary Disease

## 2011-12-17 NOTE — Telephone Encounter (Signed)
Will be happy to address at upcoming visit.

## 2011-12-17 NOTE — Telephone Encounter (Signed)
I spoke with lecretia and she states she saw pt in the hospital and he is requesting a more portable oxygen. He currently has helios and states this runs out to fast. Pt last OV w/ KC was 07/15/11 and told to f/u in 2 months. Pt has OV scheduled for 12/24/11 for HFU w/ [facility]. Please advise Dr. Shelle Iron, thanks

## 2011-12-18 NOTE — Telephone Encounter (Signed)
i spoke with [name] and she was already aware of KC response. Nothing further was needed

## 2011-12-24 ENCOUNTER — Inpatient Hospital Stay: Payer: Medicare Other | Admitting: Pulmonary Disease

## 2012-01-15 ENCOUNTER — Telehealth: Payer: Self-pay | Admitting: Pulmonary Disease

## 2012-01-15 ENCOUNTER — Other Ambulatory Visit (HOSPITAL_COMMUNITY): Payer: Self-pay | Admitting: Internal Medicine

## 2012-01-15 NOTE — Telephone Encounter (Signed)
Called and spoke with [place]. She states that pt is needing eval for helios system at next ov 01/17/12. He is having issues with his current portable system, which only last 2-3 hours and he gets anxious about running out if something where to happen and he would not get home on time. He will need to have new qualifying sats. I will forward to [facility] and LC to give the heads up.

## 2012-01-17 ENCOUNTER — Encounter: Payer: Self-pay | Admitting: Pulmonary Disease

## 2012-01-17 ENCOUNTER — Ambulatory Visit (INDEPENDENT_AMBULATORY_CARE_PROVIDER_SITE_OTHER): Payer: Medicare Other | Admitting: Pulmonary Disease

## 2012-01-17 VITALS — BP 110/70 | HR 60 | Temp 97.9°F | Ht 66.0 in | Wt 147.4 lb

## 2012-01-17 DIAGNOSIS — J4489 Other specified chronic obstructive pulmonary disease: Secondary | ICD-10-CM

## 2012-01-17 DIAGNOSIS — J449 Chronic obstructive pulmonary disease, unspecified: Secondary | ICD-10-CM

## 2012-01-17 MED ORDER — BUDESONIDE-FORMOTEROL FUMARATE 160-4.5 MCG/ACT IN AERO: 2.0000 | INHALATION_SPRAY | Freq: Two times a day (BID) | RESPIRATORY_TRACT | Status: DC

## 2012-01-17 MED ORDER — TIOTROPIUM BROMIDE MONOHYDRATE 18 MCG IN CAPS: 18.0000 ug | ORAL_CAPSULE | Freq: Every day | RESPIRATORY_TRACT | Status: DC

## 2012-01-17 NOTE — Patient Instructions (Signed)
Will try you again on spiriva one inhalation each am, and also symbicort 160/4.5 2 inhalations each am and pm.  Keep mouth rinsed well Do not take nebulizer treatments except for rescue/emergencies. You do not need oxygen at rest or with exertion.  Go back to using only while sleeping followup with me in 6mos.

## 2012-01-17 NOTE — Assessment & Plan Note (Signed)
The patient has had 2 hospitalizations in the last few months with decompensated congestive heart failure, and currently has no evidence for bronchospasm on exam.  Given the severely of his disease, I think he would do better on long-acting bronchodilators rather than shorter acting medications.  I would like to try and give him back on Spiriva and symbicort since he states financial access to his medications is not an issue.  The patient had adequate oxygen saturations today on room air and with exertion, and therefore does not need oxygen under the circumstances.

## 2012-01-17 NOTE — Telephone Encounter (Signed)
Will close encounter- see ov from 01/17/12

## 2012-01-17 NOTE — Progress Notes (Signed)
  Subjective:    Patient ID: [name], male    DOB: 07-19-1936, 76 y.o.   MRN: [id number]  HPI The patient comes in today after 2 recent hospitalizations for congestive heart failure.  He also has a history of significant COPD.  The patient has known diastolic dysfunction, significant mitral valve disease, and atrial fibrillation.  He has been in the hospital in March and April for congestive heart failure, with no evidence for COPD exacerbation or an actual pneumonia.  He comes in today where he is using nebulized bronchodilators, but no long-acting medications despite being discharged on symbicort and Spiriva.  The patient states the cost of his medications is not an issue.  He also was discharged on cardiac medications, but it is unclear from his history today whether he is taking these medications.   Review of Systems  Constitutional: Negative for fever and unexpected weight change.  HENT: Positive for rhinorrhea. Negative for ear pain, nosebleeds, congestion, sore throat, sneezing, trouble swallowing, dental problem, postnasal drip and sinus pressure.   Eyes: Negative for redness and itching.  Respiratory: Positive for cough and shortness of breath. Negative for chest tightness and wheezing.   Cardiovascular: Negative for palpitations and leg swelling.  Gastrointestinal: Negative for nausea and vomiting.  Genitourinary: Negative for dysuria.  Musculoskeletal: Negative for joint swelling.  Skin: Negative for rash.  Neurological: Negative for headaches.  Hematological: Does not bruise/bleed easily.  Psychiatric/Behavioral: Negative for dysphoric mood. The patient is not nervous/anxious.        Objective:   Physical Exam Thin and cachectic male in no acute distress Nose without purulence or discharge noted Chest with decreased breath sounds, but no wheezes or rhonchi Cardiac exam with mild irregularity, controlled ventricular response, 2/6 systolic murmur. Lower extremities  without edema, no cyanosis Alert and oriented, moves all 4 extremities.      Assessment & Plan:

## 2012-01-22 ENCOUNTER — Telehealth: Payer: Self-pay | Admitting: Pulmonary Disease

## 2012-01-22 NOTE — Telephone Encounter (Signed)
Let the pt know that we need to put this issue to rest.  Have him come in for walk testing.

## 2012-01-22 NOTE — Telephone Encounter (Signed)
Pt is coming in tomorrow at 2pm to do 6 minute walk test.

## 2012-01-22 NOTE — Telephone Encounter (Signed)
I spoke with Nehemiah Settle, [facility] nurse,  and advised that at OV on 01-17-12 we walked the pt and his oxygen level did nt drop so that is why KC d/c oxygen. She states when she went to see the pt today his sats were 82% at rest on room air. She states she taught the pt how to correctly do pursed lip breathing and his sats increased to 92% on after about 30 secs. She states the pt denies any cough, chest congestion, his lungs sounded clear. She advised the pt to slow down his walking pace and to do pursed lip breathing technique. Brooke wanted to let Dr. Shelle Iron know and see if he had any recs for the pt. Carron Curie, CMA No Known Allergies

## 2012-01-23 ENCOUNTER — Ambulatory Visit (INDEPENDENT_AMBULATORY_CARE_PROVIDER_SITE_OTHER): Payer: Medicare Other | Admitting: Pulmonary Disease

## 2012-01-23 DIAGNOSIS — R0600 Dyspnea, unspecified: Secondary | ICD-10-CM

## 2012-01-23 DIAGNOSIS — R0609 Other forms of dyspnea: Secondary | ICD-10-CM

## 2012-01-24 ENCOUNTER — Telehealth: Payer: Self-pay | Admitting: Pulmonary Disease

## 2012-01-24 NOTE — Telephone Encounter (Signed)
Please let pt know that he does not need oxygen at rest or with activity. Continue wearing at hs.

## 2012-01-24 NOTE — Progress Notes (Signed)
Please let pt know that his sats did not drop significantly.  He does not need oxygen at rest or with exertion for now. He does need to continue at night.

## 2012-01-24 NOTE — Telephone Encounter (Signed)
ATC x1..sodium...will try again later

## 2012-01-29 ENCOUNTER — Encounter: Payer: Self-pay | Admitting: Vascular Surgery

## 2012-01-30 ENCOUNTER — Encounter: Payer: Self-pay | Admitting: Vascular Surgery

## 2012-01-30 ENCOUNTER — Ambulatory Visit (INDEPENDENT_AMBULATORY_CARE_PROVIDER_SITE_OTHER): Payer: Medicare Other | Admitting: Vascular Surgery

## 2012-01-30 ENCOUNTER — Ambulatory Visit
Admission: RE | Admit: 2012-01-30 | Discharge: 2012-01-30 | Disposition: A | Discharge status: Home / Self Care | Payer: Medicare Other | Source: Ambulatory Visit | Attending: Vascular Surgery | Admitting: Vascular Surgery

## 2012-01-30 VITALS — BP 153/95 | HR 87 | Resp 16 | Ht 69.0 in | Wt 141.2 lb

## 2012-01-30 DIAGNOSIS — I714 Abdominal aortic aneurysm, without rupture, unspecified: Secondary | ICD-10-CM

## 2012-01-30 DIAGNOSIS — Z48812 Encounter for surgical aftercare following surgery on the circulatory system: Secondary | ICD-10-CM

## 2012-01-30 MED ORDER — IOHEXOL 350 MG/ML SOLN
60.0000 mL | Freq: Once | INTRAVENOUS | Status: AC | PRN
  Administered 2012-01-30: 60 mL via INTRAVENOUS

## 2012-01-30 NOTE — Progress Notes (Signed)
History of Present Illness: Patient is a 76 year old male who presents for follow-up evaluation of AAA. He underwent Gore Excluder aneurysm stent graft repair in 01/30/11. The patient denies new abdominal or back pain. The patient's atherosclerotic risk factors remain a fib, chf, hypertension. These are all currently stable and followed by his primary care physician. He denies any hip or buttock claudication. He does have some occasional right knee pain from arthritis. His stent graft repair require coil embolization of the right internal iliac artery. He had subtotal occlusion of the left internal iliac artery. The stent graft was extended into the external iliac artery bilaterally the treat common iliac aneurysms as well. He denies any new abdominal or back pain. He does have chronic right-sided back pain.  Physical exam: Filed Vitals:   01/30/12 1218  BP: 153/95  Pulse: 87  Resp: 16  Height: 5\' 9"  (1.753 m)  Weight: 141 lb 3.2 oz (64.048 kg)  SpO2: 98%   Abdomen: Tense difficult for him to relax to the back pain no obvious palpable masses  Extremities: 2+ femoral pulses bilaterally  Data: CT angiogram the abdomen and pelvis was performed today. This shows the stent graft is patent just below the level of the renal arteries with no evidence of migration. There is some tortuosity to the proximal infrarenal aorta. The iliac aneurysms are well excluded. Has coil embolization of the right internal iliac artery. No evidence of endoleak.  Infrarenal aortic diameter is 3.9 cm right common iliac diameter is 4.37 m left common iliac diameter is 2.5 cm. 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  Plan: Followup one year with aortic ultrasound in the office  Fabienne Bruns, MD [facility] Office: [phone] Pager: [phone]

## 2012-01-30 NOTE — Telephone Encounter (Signed)
ATC x 1. NA. WCB later. 

## 2012-02-05 NOTE — Telephone Encounter (Signed)
Pt aware of KC recs for oxygen use and verbalized understanding.

## 2012-02-12 ENCOUNTER — Encounter: Payer: Self-pay | Admitting: Cardiology

## 2012-02-12 ENCOUNTER — Ambulatory Visit (INDEPENDENT_AMBULATORY_CARE_PROVIDER_SITE_OTHER): Payer: Medicare Other | Admitting: Cardiology

## 2012-02-12 VITALS — BP 122/68 | HR 65 | Ht 69.0 in | Wt 139.0 lb

## 2012-02-12 DIAGNOSIS — I059 Rheumatic mitral valve disease, unspecified: Secondary | ICD-10-CM

## 2012-02-12 DIAGNOSIS — Z9889 Other specified postprocedural states: Secondary | ICD-10-CM

## 2012-02-12 DIAGNOSIS — I10 Essential (primary) hypertension: Secondary | ICD-10-CM

## 2012-02-12 DIAGNOSIS — I340 Nonrheumatic mitral (valve) insufficiency: Secondary | ICD-10-CM

## 2012-02-12 DIAGNOSIS — Z8679 Personal history of other diseases of the circulatory system: Secondary | ICD-10-CM

## 2012-02-12 DIAGNOSIS — N189 Chronic kidney disease, unspecified: Secondary | ICD-10-CM

## 2012-02-12 DIAGNOSIS — I4891 Unspecified atrial fibrillation: Secondary | ICD-10-CM

## 2012-02-12 DIAGNOSIS — I472 Ventricular tachycardia: Secondary | ICD-10-CM

## 2012-02-12 DIAGNOSIS — I5032 Chronic diastolic (congestive) heart failure: Secondary | ICD-10-CM

## 2012-02-12 NOTE — Progress Notes (Signed)
HPI [name] returns today for evaluation and management of his chronic A. fib, peripheral vascular disease with a history of stent repair of abdominal aortic aneurysm, history of diastolic heart failure, and subclinical coronary disease.  His biggest limitation is shortness of breath area and he walks with a cane. He is followed by pulmonary. Denies any chest pain or palpitations. He said orthopnea PND or edema.  Past Medical History  Diagnosis Date  . Abdominal aortic aneurysm     s/p stent graft repair in 6/12  . Iliac artery aneurysm, bilateral   . Anemia   . Atrial fibrillation     no coumadin due to h/o GI bleeding. 05/2011 stopped  metoprolol, amiodarone, digoxin, diltiazem due to bradycardia   . Tibial plateau fracture     non displaced  . COPD (chronic obstructive pulmonary disease)     emphysema and severe outflow obstruction, on home O2  . Lung mass     right lower lobe spiculated mass  . Chronic kidney disease     Stage II  . Oxygen dependent   . CHF (congestive heart failure)     Diastolic Heart failure, Hx of severe MR;  Echocardiogram 05/01/11: Mild LVH, EF 55-60%, mild AI, severe MR, severe LAE, moderate RAE, normal RVSF, moderate-severe TR, PASP 79, severe pulmonary hypertension  . Hypertension   . Osteoporosis   . Alcoholic hepatitis     Current Outpatient Prescriptions  Medication Sig Dispense Refill  . albuterol (PROVENTIL HFA;VENTOLIN HFA) 108 (90 BASE) MCG/ACT inhaler Inhale 2 puffs into the lungs every 6 (six) hours as needed. For shortness of breath      . albuterol (PROVENTIL) 2 MG tablet Take 2 mg by mouth 3 (three) times daily.      . budesonide-formoterol (SYMBICORT) 160-4.5 MCG/ACT inhaler Inhale 2 puffs into the lungs 2 (two) times daily.  1 Inhaler  6  . diltiazem (CARDIZEM CD) 360 MG 24 hr capsule Take 1 capsule (360 mg total) by mouth daily.  30 capsule  0  . furosemide (LASIX) 80 MG tablet Take 1 tablet (80 mg total) by mouth daily.      Marland Kitchen  ipratropium-albuterol (DUONEB) 0.5-2.5 (3) MG/3ML SOLN Inhale 1 vial into the lungs Every 4 hours as needed.      . metoprolol (LOPRESSOR) 100 MG tablet Take 1 tablet (100 mg total) by mouth 2 (two) times daily.  60 tablet  1  . pantoprazole (PROTONIX) 40 MG tablet Take 40 mg by mouth 2 (two) times daily.       . potassium chloride SA (K-DUR,KLOR-CON) 20 MEQ tablet Take 1 tablet (20 mEq total) by mouth daily.  30 tablet  0  . tiotropium (SPIRIVA) 18 MCG inhalation capsule Place 1 capsule (18 mcg total) into inhaler and inhale daily.  30 capsule  6    No Known Allergies  Family History  Problem Relation Age of Onset  . Emphysema Father   . Leukemia Father   . Heart disease Mother   . Rheum arthritis Mother   . Anemia Mother   . Hypertension Mother   . Coronary artery disease Mother     History   Social History  . Marital Status: Single    Spouse Name: N/A    Number of Children: 0  . Years of Education: N/A   Occupational History  . retired    Social History Main Topics  . Smoking status: Former Smoker -- 0.5 packs/day for 20 years    Types:  Cigarettes    Quit date: 08/26/2009  . Smokeless tobacco: Never Used  . Alcohol Use: No     history of ETOH abuse in the past  . Drug Use: No  . Sexually Active: Not on file   Other Topics Concern  . Not on file   Social History Narrative   Lives alone, but has a sister across the street and a brother also. Never married, no kids. Ambulatory with a cane and walker. Denies smoking, drinking, drugs at presents. Previously had visiting nurse but not now.     ROS ALL NEGATIVE EXCEPT THOSE NOTED IN HPI  PE  General Appearance: well developed, well nourished in no acute distress, frail, chronically ill-appearing HEENT: symmetrical face, PERRLA, good dentition  Neck: no JVD, thyromegaly, or adenopathy, trachea midline Chest: symmetric without deformity Cardiac: PMI non-displaced, irregular rate and rhythm, normal S1, S2, no gallop  or murmur Lung: clear to ausculation and percussion Vascular: Diminished in the lower extremities the feet are warm. No ulcers or lesions.   Abdominal: nondistended, nontender, good bowel sounds, no HSM, no bruits Extremities: no cyanosis, clubbing or edema, no sign of DVT, no varicosities  Skin: normal color, no rashes Neuro: alert and oriented x 3, non-focal Pysch: normal affect  EKG Chronic A. fib, LVH, no acute changes. BMET    Component Value Date/Time   NA 141 12/08/2011 0540   K 5.0 12/08/2011 0540   CL 100 12/08/2011 0540   CO2 32 12/08/2011 0540   GLUCOSE 87 12/08/2011 0540   BUN 39* 12/08/2011 0540   CREATININE 1.60* 12/08/2011 0540   CALCIUM 9.0 12/08/2011 0540   CALCIUM 7.9* 05/13/2011 1426   GFRNONAA 41* 12/08/2011 0540   GFRAA 47* 12/08/2011 0540    Lipid Panel     Component Value Date/Time   CHOL 126 05/01/2011 0139   TRIG 48 05/01/2011 0139   HDL 37* 05/01/2011 0139   CHOLHDL 3.4 05/01/2011 0139   VLDL 10 05/01/2011 0139   LDLCALC 79 05/01/2011 0139    CBC    Component Value Date/Time   WBC 12.9* 12/08/2011 0540   RBC 3.17* 12/08/2011 0540   HGB 10.0* 12/08/2011 0540   HCT 32.6* 12/08/2011 0540   PLT 173 12/08/2011 0540   MCV 102.8* 12/08/2011 0540   MCH 31.5 12/08/2011 0540   MCHC 30.7 12/08/2011 0540   RDW 18.3* 12/08/2011 0540   LYMPHSABS 0.9 11/27/2011 2241   MONOABS 0.8 11/27/2011 2241   EOSABS 0.0 11/27/2011 2241   BASOSABS 0.0 11/27/2011 2241

## 2012-02-12 NOTE — Patient Instructions (Addendum)
Your physician wants you to follow-up in: 1 year with Dr. Wall. You will receive a reminder letter in the mail two months in advance. If you don't receive a letter, please call our office to schedule the follow-up appointment.  

## 2012-02-12 NOTE — Assessment & Plan Note (Signed)
Stable clinically. He has a very complex medical history with multiple comorbidities. He is for medical treatment only. Fortunately, he is stable. I've made no changes today. His greatest limitations probably his chronic lung disease. He'll continue to followup with pulmonary.

## 2012-06-11 IMAGING — CR DG CHEST 1V
2 series · 2 of 2 positions shown · non-contrast
Comparison: 10/12/2011

CLINICAL DATA: Short of breath.  Hypoxia.

CHEST - 1 VIEW

[x chest ap (1 of 2)]
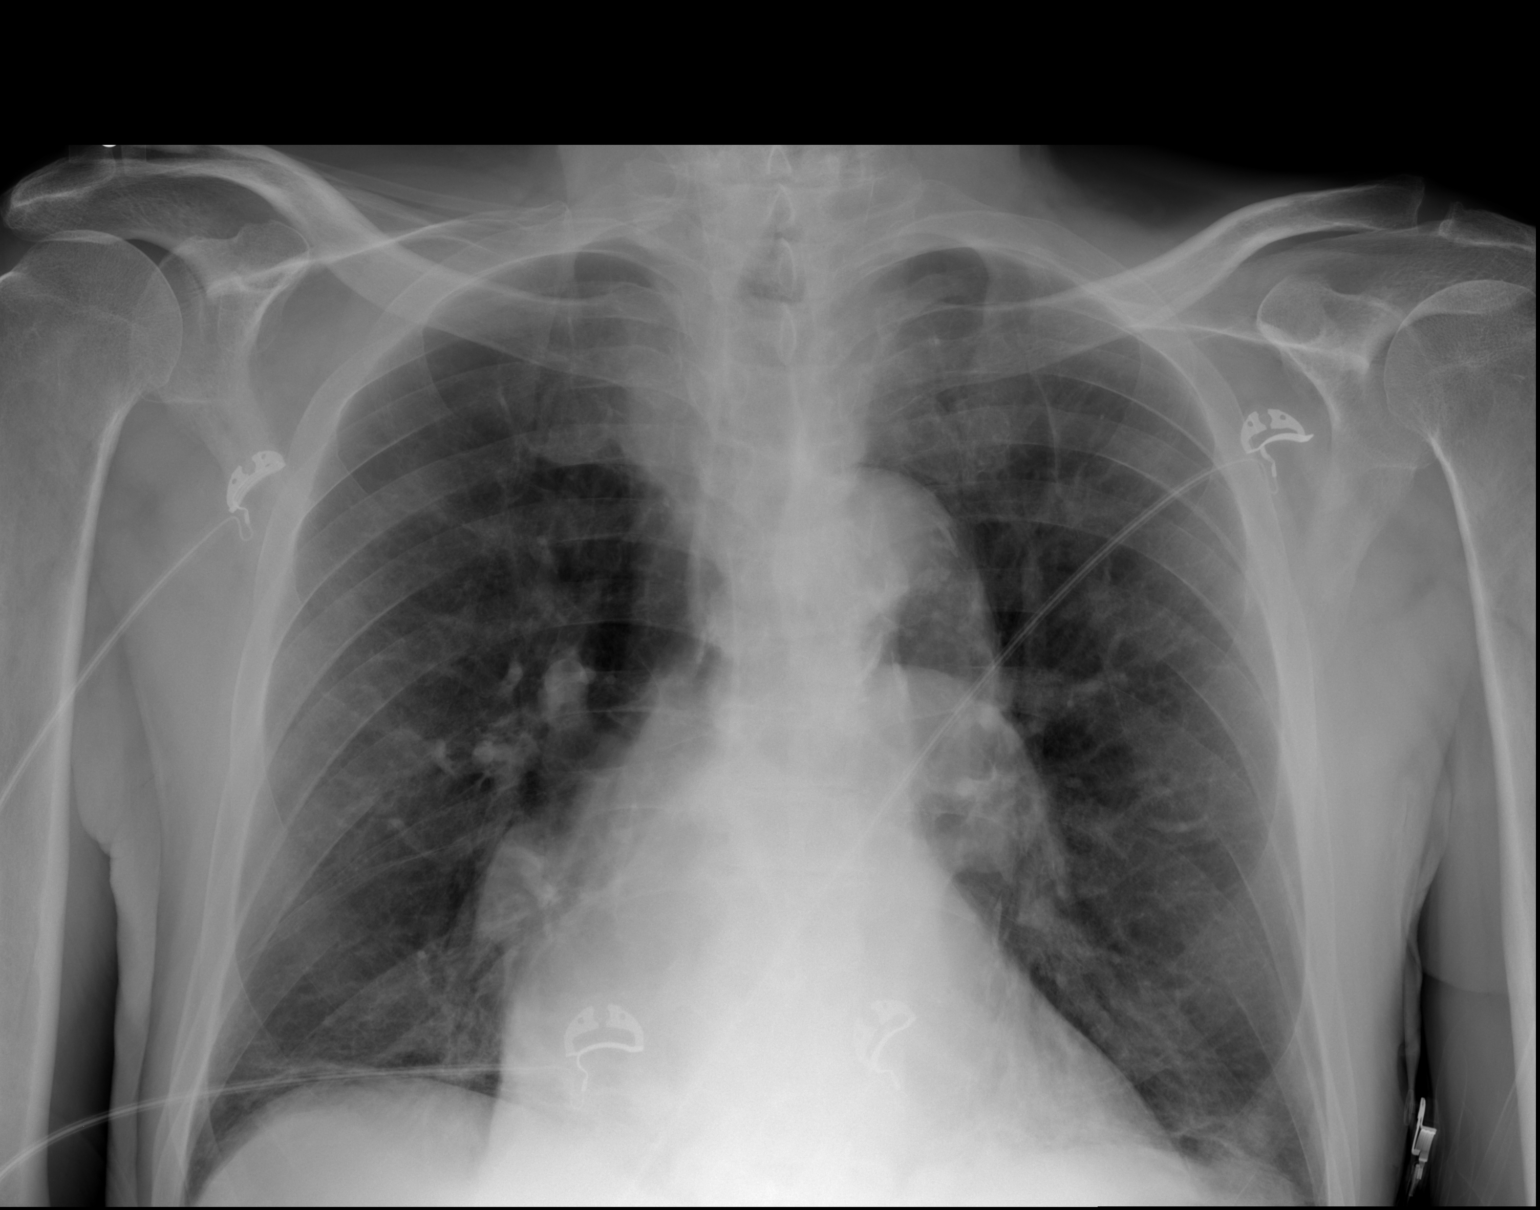

[x chest ap (2 of 2)]
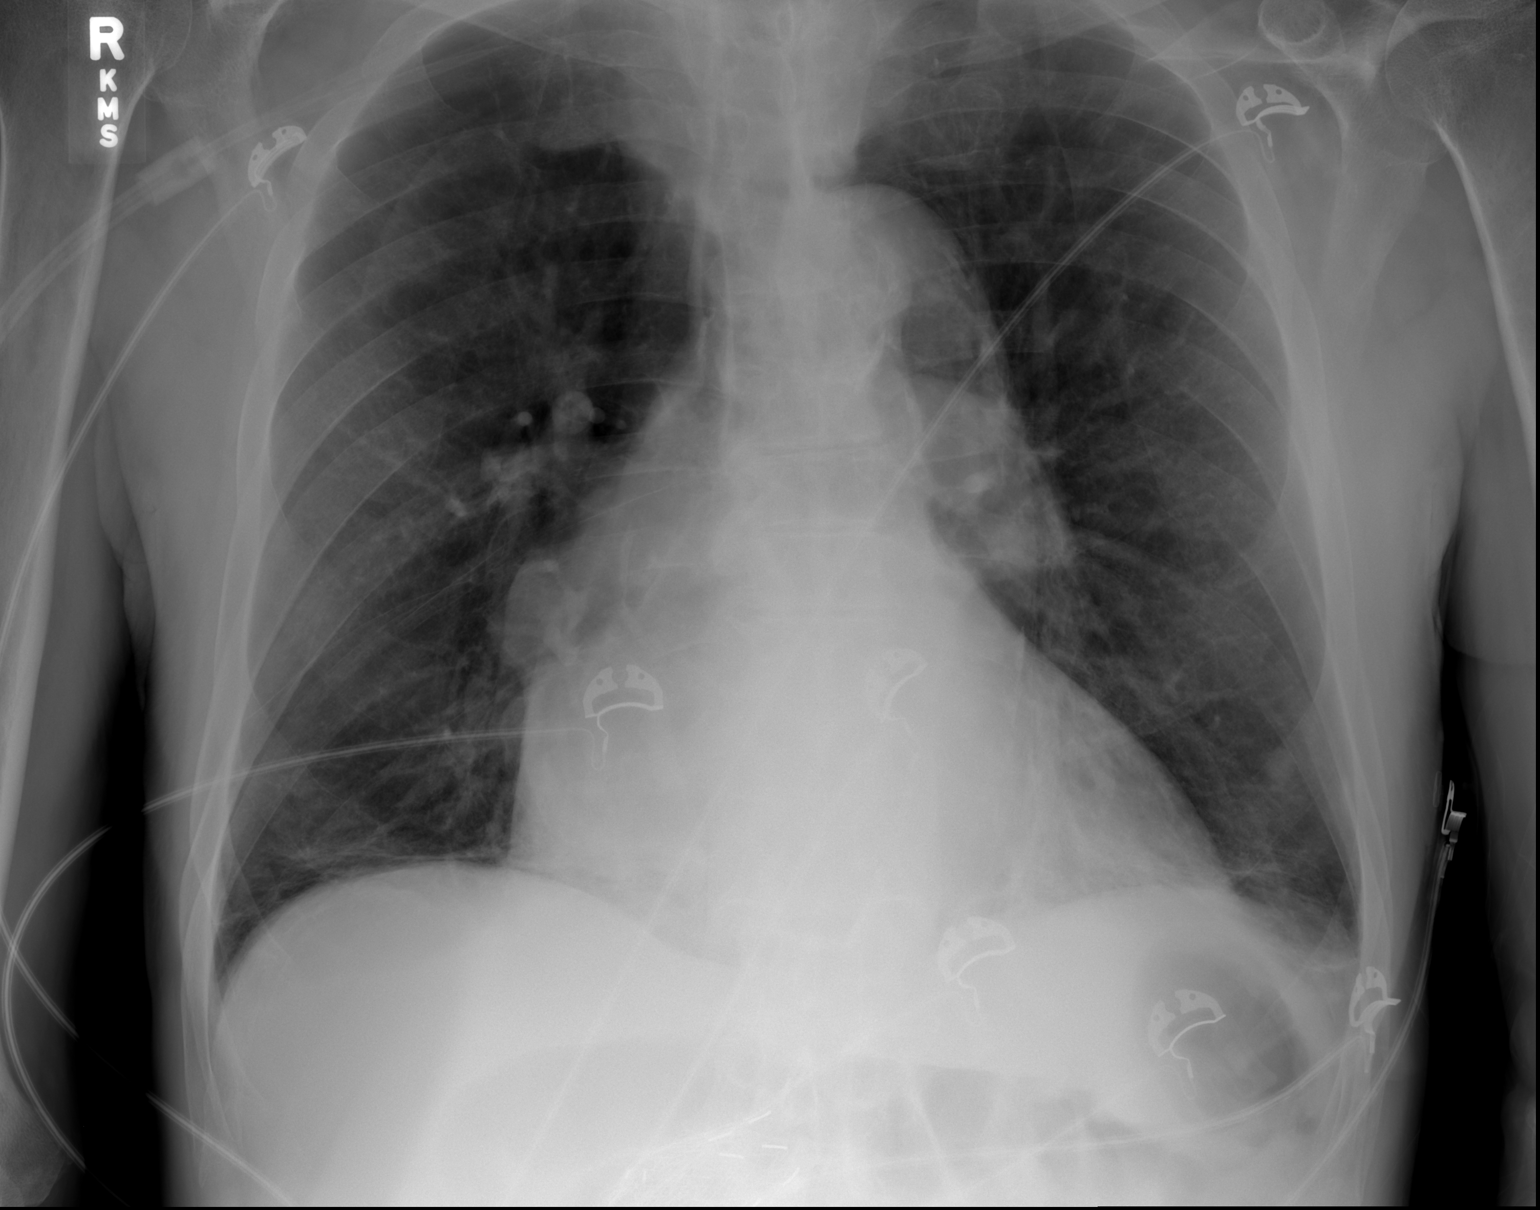

[2 of 2 positions shown; findings below may reference images not displayed]

FINDINGS: Moderate cardiomegaly and ectasia of the thoracic aorta
are stable.  Mild bibasilar scarring is unchanged.  No evidence of
acute infiltrate or edema.  No evidence of pleural effusion.
IMPRESSION: Stable cardiomegaly and bibasilar scarring.  No acute findings.

## 2012-07-20 ENCOUNTER — Ambulatory Visit: Payer: Medicare Other | Admitting: Pulmonary Disease

## 2012-07-25 ENCOUNTER — Emergency Department (HOSPITAL_COMMUNITY): Payer: Medicare Other

## 2012-07-25 ENCOUNTER — Encounter (HOSPITAL_COMMUNITY): Payer: Self-pay | Admitting: *Deleted

## 2012-07-25 ENCOUNTER — Emergency Department (HOSPITAL_COMMUNITY)
Admission: EM | Admit: 2012-07-25 | Discharge: 2012-07-25 | Disposition: A | Discharge status: Home / Self Care | Payer: Medicare Other | Attending: Emergency Medicine | Admitting: Emergency Medicine

## 2012-07-25 DIAGNOSIS — M25579 Pain in unspecified ankle and joints of unspecified foot: Secondary | ICD-10-CM | POA: Insufficient documentation

## 2012-07-25 DIAGNOSIS — Z79899 Other long term (current) drug therapy: Secondary | ICD-10-CM | POA: Insufficient documentation

## 2012-07-25 DIAGNOSIS — D649 Anemia, unspecified: Secondary | ICD-10-CM | POA: Insufficient documentation

## 2012-07-25 DIAGNOSIS — J449 Chronic obstructive pulmonary disease, unspecified: Secondary | ICD-10-CM | POA: Insufficient documentation

## 2012-07-25 DIAGNOSIS — N182 Chronic kidney disease, stage 2 (mild): Secondary | ICD-10-CM | POA: Insufficient documentation

## 2012-07-25 DIAGNOSIS — Z8781 Personal history of (healed) traumatic fracture: Secondary | ICD-10-CM | POA: Insufficient documentation

## 2012-07-25 DIAGNOSIS — Z8709 Personal history of other diseases of the respiratory system: Secondary | ICD-10-CM | POA: Insufficient documentation

## 2012-07-25 DIAGNOSIS — I4891 Unspecified atrial fibrillation: Secondary | ICD-10-CM | POA: Insufficient documentation

## 2012-07-25 DIAGNOSIS — J4489 Other specified chronic obstructive pulmonary disease: Secondary | ICD-10-CM | POA: Insufficient documentation

## 2012-07-25 DIAGNOSIS — I509 Heart failure, unspecified: Secondary | ICD-10-CM | POA: Insufficient documentation

## 2012-07-25 DIAGNOSIS — M7989 Other specified soft tissue disorders: Secondary | ICD-10-CM | POA: Insufficient documentation

## 2012-07-25 DIAGNOSIS — Z87891 Personal history of nicotine dependence: Secondary | ICD-10-CM | POA: Insufficient documentation

## 2012-07-25 DIAGNOSIS — M810 Age-related osteoporosis without current pathological fracture: Secondary | ICD-10-CM | POA: Insufficient documentation

## 2012-07-25 DIAGNOSIS — I129 Hypertensive chronic kidney disease with stage 1 through stage 4 chronic kidney disease, or unspecified chronic kidney disease: Secondary | ICD-10-CM | POA: Insufficient documentation

## 2012-07-25 MED ORDER — IBUPROFEN 600 MG PO TABS: 600.0000 mg | ORAL_TABLET | Freq: Four times a day (QID) | ORAL | Status: DC | PRN

## 2012-07-25 MED ORDER — OXYCODONE-ACETAMINOPHEN 5-325 MG PO TABS: 1.0000 | ORAL_TABLET | Freq: Four times a day (QID) | ORAL | Status: DC | PRN

## 2012-07-25 MED ORDER — OXYCODONE-ACETAMINOPHEN 5-325 MG PO TABS
1.0000 | ORAL_TABLET | Freq: Once | ORAL | Status: AC
  Administered 2012-07-25: 1 via ORAL
  Filled 2012-07-25: qty 1

## 2012-07-25 NOTE — ED Provider Notes (Signed)
History   This chart was scribed for [name] Chick, MD scribed by Magnus Sinning. The patient was seen in room TR09C/TR09C at 12:29   CSN: [id number]  Arrival date & time 07/25/12  1150   None     Chief Complaint  Patient presents with  . Foot Pain    (Consider location/radiation/quality/duration/timing/severity/associated sxs/prior treatment) HPI Comments: MAZE CORNIEL is a 76 y.o. male who presents to the Emergency Department complaining of constant right ankle and foot pain, onset 3-4 days. He states he has been unable to bear weight. He denies hx of gout.   Patient is a 76 y.o. male presenting with lower extremity pain. The history is provided by the patient. No language interpreter was used.  Foot Pain This is a new problem. The current episode started more than 2 days ago. The problem occurs constantly. The problem has not changed since onset.Pertinent negatives include no chest pain, no abdominal pain, no headaches and no shortness of breath. The symptoms are aggravated by walking.     Past Medical History  Diagnosis Date  . Abdominal aortic aneurysm     s/p stent graft repair in 6/12  . Iliac artery aneurysm, bilateral   . Anemia   . Atrial fibrillation     no coumadin due to h/o GI bleeding. 05/2011 stopped  metoprolol, amiodarone, digoxin, diltiazem due to bradycardia   . Tibial plateau fracture     non displaced  . COPD (chronic obstructive pulmonary disease)     emphysema and severe outflow obstruction, on home O2  . Lung mass     right lower lobe spiculated mass  . Chronic kidney disease     Stage II  . Oxygen dependent   . CHF (congestive heart failure)     Diastolic Heart failure, Hx of severe MR;  Echocardiogram 05/01/11: Mild LVH, EF 55-60%, mild AI, severe MR, severe LAE, moderate RAE, normal RVSF, moderate-severe TR, PASP 79, severe pulmonary hypertension  . Hypertension   . Osteoporosis   . Alcoholic hepatitis     Past Surgical History    Procedure Date  . Arteriovenous graft placement   . Abdominal aortic aneurysm repair   . Bladder surgery   . Neck/back surgery     Family History  Problem Relation Age of Onset  . Emphysema Father   . Leukemia Father   . Heart disease Mother   . Rheum arthritis Mother   . Anemia Mother   . Hypertension Mother   . Coronary artery disease Mother     History  Substance Use Topics  . Smoking status: Former Smoker -- 0.5 packs/day for 20 years    Types: Cigarettes    Quit date: 08/26/2009  . Smokeless tobacco: Never Used  . Alcohol Use: No     Comment: history of ETOH abuse in the past      Review of Systems  Respiratory: Negative for shortness of breath.   Cardiovascular: Negative for chest pain.  Gastrointestinal: Negative for abdominal pain.  Neurological: Negative for headaches.  All other systems reviewed and are negative.    Allergies  Review of patient's allergies indicates no known allergies.  Home Medications   Current Outpatient Rx  Name  Route  Sig  Dispense  Refill  . ALBUTEROL SULFATE HFA 108 (90 BASE) MCG/ACT IN AERS   Inhalation   Inhale 2 puffs into the lungs every 6 (six) hours as needed. For wheezing         .  AMLODIPINE BESYLATE 10 MG PO TABS   Oral   Take 10 mg by mouth daily.         . BUDESONIDE-FORMOTEROL FUMARATE 160-4.5 MCG/ACT IN AERO   Inhalation   Inhale 2 puffs into the lungs 2 (two) times daily.   1 Inhaler   6   . DILTIAZEM HCL ER COATED BEADS 180 MG PO CP24   Oral   Take 180 mg by mouth daily.         . FUROSEMIDE 80 MG PO TABS   Oral   Take 1 tablet (80 mg total) by mouth daily.         . IPRATROPIUM-ALBUTEROL 0.5-2.5 (3) MG/3ML IN SOLN   Inhalation   Inhale 1 vial into the lungs Every 4 hours as needed. For wheezing         . METOPROLOL TARTRATE 100 MG PO TABS   Oral   Take 1 tablet (100 mg total) by mouth 2 (two) times daily.   60 tablet   1   . PANTOPRAZOLE SODIUM 40 MG PO TBEC   Oral   Take 40  mg by mouth 2 (two) times daily.          Marland Kitchen POTASSIUM CHLORIDE CRYS ER 20 MEQ PO TBCR   Oral   Take 1 tablet (20 mEq total) by mouth daily.   30 tablet   0   . TIOTROPIUM BROMIDE MONOHYDRATE 18 MCG IN CAPS   Inhalation   Place 1 capsule (18 mcg total) into inhaler and inhale daily.   30 capsule   6   . ALBUTEROL SULFATE 2 MG PO TABS   Oral   Take 2 mg by mouth 3 (three) times daily.         . IBUPROFEN 600 MG PO TABS   Oral   Take 1 tablet (600 mg total) by mouth every 6 (six) hours as needed for pain.   30 tablet   0   . OXYCODONE-ACETAMINOPHEN 5-325 MG PO TABS   Oral   Take 1-2 tablets by mouth every 6 (six) hours as needed for pain.   15 tablet   0     BP 103/89  Pulse 62  Temp 97.9 F (36.6 C) (Oral)  Resp 17  SpO2 97%  Physical Exam  Nursing note and vitals reviewed. Constitutional: He is oriented to person, place, and time. He appears well-developed and well-nourished. No distress.  HENT:  Head: Normocephalic and atraumatic.  Eyes: Conjunctivae normal and EOM are normal.  Neck: Neck supple. No tracheal deviation present.  Cardiovascular: Normal rate.   Pulmonary/Chest: Effort normal. No respiratory distress.  Abdominal: He exhibits no distension.  Musculoskeletal: Normal range of motion.       Right ankle is swollen over the lateral and medial malleouls, Tenderness over the lateral mallelous. No overlying erythema or warmth. 2+ DP pulse.   Neurological: He is alert and oriented to person, place, and time. No sensory deficit.  Skin: Skin is dry.  Psychiatric: He has a normal mood and affect. His behavior is normal.    ED Course  Procedures (including critical care time) DIAGNOSTIC STUDIES:   COORDINATION OF CARE:  Labs Reviewed - No data to display Dg Ankle Complete Right  07/25/2012  *RADIOLOGY REPORT*  Clinical Data: Right ankle pain and swelling over the past 3 days. No known injuries.  RIGHT ANKLE - COMPLETE 3+ VIEW  Comparison: None.   Findings: Mild diffuse soft tissue swelling. No evidence of  acute or subacute fracture or dislocation.  Ankle mortise intact with well-preserved joint space.  No intrinsic osseous abnormalities. No evidence of a significant joint effusion.  IMPRESSION: No osseous abnormality.   Original Report Authenticated By: Hulan Saas, M.D.      1. Ankle pain       MDM  Pt presenting with c/o right ankle pain and swelling- no overlying warmth or erythema to suggest gout or septic arthritis.  No fever.  Xray shows no signifcant effusion or other acute abnormality. Pt discharged with pain medication and will need f/u with ortho if symtpoms continue.  Discharged with strict return precautions.  Pt agreeable with plan.   I personally performed the services described in this documentation, which was scribed in my presence. The recorded information has been reviewed and is accurate.         [name] Chick, MD 07/25/12 [phone]

## 2012-07-25 NOTE — ED Notes (Addendum)
Belinda (neice) [phone] cell and Karin Golden (sister) [phone] house---please call when pt is being discharged

## 2012-07-25 NOTE — ED Notes (Signed)
Reports having several days of right ankle and foot pain, has been unable to bear weight on that foot. Denies injury to foot or ankle, denies hx of gout. Swelling noted to ankle.

## 2012-07-25 NOTE — ED Notes (Signed)
Patient discharged with instructions using the teach back method, he verbalizes an understanding. Discharged with family

## 2012-08-13 ENCOUNTER — Encounter: Payer: Self-pay | Admitting: Pulmonary Disease

## 2012-08-13 ENCOUNTER — Ambulatory Visit (INDEPENDENT_AMBULATORY_CARE_PROVIDER_SITE_OTHER): Payer: Medicare Other | Admitting: Pulmonary Disease

## 2012-08-13 VITALS — BP 128/80 | HR 113 | Temp 97.5°F | Ht 67.5 in | Wt 144.6 lb

## 2012-08-13 DIAGNOSIS — J449 Chronic obstructive pulmonary disease, unspecified: Secondary | ICD-10-CM

## 2012-08-13 NOTE — Patient Instructions (Addendum)
Stop the albuterol TABLET (pill, not inhaler) Stay on symbicort and spiriva everyday.  Try and stay as active as possible. followup with me in 6mos.

## 2012-08-13 NOTE — Progress Notes (Signed)
  Subjective:    Patient ID: [name], male    DOB: 1936/08/02, 76 y.o.   MRN: [id number]  HPI The patient comes in today for followup of his known severe COPD.  He is staying compliant on his medications, and has not had an acute exacerbation or pulmonary infection since last visit.  He has not had a flu shot this year.   Review of Systems  Constitutional: Negative for fever and unexpected weight change.  HENT: Positive for rhinorrhea. Negative for ear pain, nosebleeds, congestion, sore throat, sneezing, trouble swallowing, dental problem, postnasal drip and sinus pressure.   Eyes: Negative for redness and itching.  Respiratory: Negative for cough, chest tightness, shortness of breath and wheezing.   Cardiovascular: Negative for palpitations and leg swelling.  Gastrointestinal: Negative for nausea and vomiting.  Genitourinary: Negative for dysuria.  Musculoskeletal: Negative for joint swelling.  Skin: Negative for rash.  Neurological: Negative for headaches.  Hematological: Does not bruise/bleed easily.  Psychiatric/Behavioral: Negative for dysphoric mood. The patient is not nervous/anxious.        Objective:   Physical Exam Thin cachectic male in no acute distress Nose without purulence or discharge noted Neck without lymphadenopathy or thyromegaly Chest with decreased breath sounds throughout, no wheezes or rhonchi Cardiac exam with irregular rhythm, mild tachycardia Lower extremities with mild ankle edema, no cyanosis Alert, oriented, moves all 4 extremities.       Assessment & Plan:

## 2012-08-13 NOTE — Assessment & Plan Note (Signed)
The patient is doing surprisingly well considering the severity of his cardiopulmonary disease.  He is remaining compliant on his bronchodilators, and I have asked him to stay as active as possible.  We'll give him the flu shot today, and he is to followup with me in 6 months.

## 2012-12-18 ENCOUNTER — Encounter (HOSPITAL_COMMUNITY): Payer: Self-pay | Admitting: Family Medicine

## 2012-12-18 ENCOUNTER — Emergency Department (HOSPITAL_COMMUNITY)
Admission: EM | Admit: 2012-12-18 | Discharge: 2012-12-18 | Disposition: A | Discharge status: Home / Self Care | Payer: Medicare Other | Attending: Emergency Medicine | Admitting: Emergency Medicine

## 2012-12-18 ENCOUNTER — Emergency Department (HOSPITAL_COMMUNITY): Payer: Medicare Other

## 2012-12-18 ENCOUNTER — Telehealth (HOSPITAL_COMMUNITY): Payer: Self-pay | Admitting: Emergency Medicine

## 2012-12-18 DIAGNOSIS — I129 Hypertensive chronic kidney disease with stage 1 through stage 4 chronic kidney disease, or unspecified chronic kidney disease: Secondary | ICD-10-CM | POA: Insufficient documentation

## 2012-12-18 DIAGNOSIS — Z8679 Personal history of other diseases of the circulatory system: Secondary | ICD-10-CM | POA: Insufficient documentation

## 2012-12-18 DIAGNOSIS — Z8719 Personal history of other diseases of the digestive system: Secondary | ICD-10-CM | POA: Insufficient documentation

## 2012-12-18 DIAGNOSIS — N186 End stage renal disease: Secondary | ICD-10-CM | POA: Insufficient documentation

## 2012-12-18 DIAGNOSIS — Z8781 Personal history of (healed) traumatic fracture: Secondary | ICD-10-CM | POA: Insufficient documentation

## 2012-12-18 DIAGNOSIS — Z8739 Personal history of other diseases of the musculoskeletal system and connective tissue: Secondary | ICD-10-CM | POA: Insufficient documentation

## 2012-12-18 DIAGNOSIS — I509 Heart failure, unspecified: Secondary | ICD-10-CM | POA: Insufficient documentation

## 2012-12-18 DIAGNOSIS — M109 Gout, unspecified: Secondary | ICD-10-CM | POA: Insufficient documentation

## 2012-12-18 DIAGNOSIS — Z87891 Personal history of nicotine dependence: Secondary | ICD-10-CM | POA: Insufficient documentation

## 2012-12-18 DIAGNOSIS — Z79899 Other long term (current) drug therapy: Secondary | ICD-10-CM | POA: Insufficient documentation

## 2012-12-18 DIAGNOSIS — Z862 Personal history of diseases of the blood and blood-forming organs and certain disorders involving the immune mechanism: Secondary | ICD-10-CM | POA: Insufficient documentation

## 2012-12-18 DIAGNOSIS — I4891 Unspecified atrial fibrillation: Secondary | ICD-10-CM | POA: Insufficient documentation

## 2012-12-18 DIAGNOSIS — J4489 Other specified chronic obstructive pulmonary disease: Secondary | ICD-10-CM | POA: Insufficient documentation

## 2012-12-18 DIAGNOSIS — J449 Chronic obstructive pulmonary disease, unspecified: Secondary | ICD-10-CM | POA: Insufficient documentation

## 2012-12-18 DIAGNOSIS — Z9981 Dependence on supplemental oxygen: Secondary | ICD-10-CM | POA: Insufficient documentation

## 2012-12-18 MED ORDER — KETOROLAC TROMETHAMINE 60 MG/2ML IM SOLN
60.0000 mg | Freq: Once | INTRAMUSCULAR | Status: AC
  Administered 2012-12-18: 60 mg via INTRAMUSCULAR
  Filled 2012-12-18: qty 2

## 2012-12-18 MED ORDER — PROMETHAZINE HCL 25 MG PO TABS: 25.0000 mg | ORAL_TABLET | Freq: Four times a day (QID) | ORAL | Status: DC | PRN

## 2012-12-18 MED ORDER — HYDROCODONE-ACETAMINOPHEN 5-325 MG PO TABS
1.0000 | ORAL_TABLET | Freq: Once | ORAL | Status: AC
  Administered 2012-12-18: 1 via ORAL
  Filled 2012-12-18: qty 1

## 2012-12-18 MED ORDER — HYDROCODONE-ACETAMINOPHEN 5-325 MG PO TABS: 1.0000 | ORAL_TABLET | ORAL | Status: DC | PRN

## 2012-12-18 NOTE — ED Notes (Signed)
Per pt sts right ankle pain and swelling x 3 days. sts also some left knee pain.

## 2012-12-18 NOTE — ED Provider Notes (Signed)
History    This chart was scribed for Junious Silk PA-C, a non-physician practitioner working with Glynn Octave, MD by Lewanda Rife, ED Scribe. This patient was seen in room TR09C/TR09C and the patient's care was started at 1548.     CSN: [id number]  Arrival date & time 12/18/12  1454   First MD Initiated Contact with Patient 12/18/12 1506      Chief Complaint  Patient presents with  . Ankle Pain    (Consider location/radiation/quality/duration/timing/severity/associated sxs/prior treatment) The history is provided by the patient.   [name] is a 77 y.o. male who presents to the Emergency Department complaining of recurrent moderate right ankle pain and swelling onset 3 days. Pt reports cortisone shots helping right knee, but is still having intermittent sharp and dull pain. Pt denies paresthesias, weakness, and recent injuries. Pt reports pain is aggravated with movement and alleviated by nothing. Pt denies taking any medications to treat symptoms. Pt denies hx of gout.   Past Medical History  Diagnosis Date  . Abdominal aortic aneurysm     s/p stent graft repair in 6/12  . Iliac artery aneurysm, bilateral   . Anemia   . Atrial fibrillation     no coumadin due to h/o GI bleeding. 05/2011 stopped  metoprolol, amiodarone, digoxin, diltiazem due to bradycardia   . Tibial plateau fracture     non displaced  . COPD (chronic obstructive pulmonary disease)     emphysema and severe outflow obstruction, on home O2  . Lung mass     right lower lobe spiculated mass  . Chronic kidney disease     Stage II  . Oxygen dependent   . CHF (congestive heart failure)     Diastolic Heart failure, Hx of severe MR;  Echocardiogram 05/01/11: Mild LVH, EF 55-60%, mild AI, severe MR, severe LAE, moderate RAE, normal RVSF, moderate-severe TR, PASP 79, severe pulmonary hypertension  . Hypertension   . Osteoporosis   . Alcoholic hepatitis     Past Surgical History  Procedure  Laterality Date  . Arteriovenous graft placement    . Abdominal aortic aneurysm repair    . Bladder surgery    . Neck/back surgery      Family History  Problem Relation Age of Onset  . Emphysema Father   . Leukemia Father   . Heart disease Mother   . Rheum arthritis Mother   . Anemia Mother   . Hypertension Mother   . Coronary artery disease Mother     History  Substance Use Topics  . Smoking status: Former Smoker -- 0.50 packs/day for 20 years    Types: Cigarettes    Quit date: 08/26/2009  . Smokeless tobacco: Never Used  . Alcohol Use: No     Comment: history of ETOH abuse in the past      Review of Systems A complete 10 system review of systems was obtained and all systems are negative except as noted in the HPI and PMH.    Allergies  Review of patient's allergies indicates no known allergies.  Home Medications   Current Outpatient Rx  Name  Route  Sig  Dispense  Refill  . albuterol (PROVENTIL HFA;VENTOLIN HFA) 108 (90 BASE) MCG/ACT inhaler   Inhalation   Inhale 2 puffs into the lungs every 6 (six) hours as needed. For wheezing          . amLODipine (NORVASC) 10 MG tablet   Oral   Take 10 mg by mouth daily.         Marland Kitchen  budesonide-formoterol (SYMBICORT) 160-4.5 MCG/ACT inhaler   Inhalation   Inhale 2 puffs into the lungs 2 (two) times daily.   1 Inhaler   6   . diltiazem (CARDIZEM CD) 180 MG 24 hr capsule   Oral   Take 180 mg by mouth daily.         . furosemide (LASIX) 40 MG tablet   Oral   Take 40 mg by mouth daily.         Marland Kitchen ibuprofen (ADVIL,MOTRIN) 600 MG tablet   Oral   Take 1 tablet (600 mg total) by mouth every 6 (six) hours as needed for pain.   30 tablet   0   . ipratropium-albuterol (DUONEB) 0.5-2.5 (3) MG/3ML SOLN   Inhalation   Inhale 1 vial into the lungs Every 4 hours as needed. For wheezing         . metoprolol (LOPRESSOR) 100 MG tablet   Oral   Take 100 mg by mouth 2 (two) times daily.         Marland Kitchen  oxyCODONE-acetaminophen (PERCOCET/ROXICET) 5-325 MG per tablet   Oral   Take 1-2 tablets by mouth every 6 (six) hours as needed for pain.   15 tablet   0   . pantoprazole (PROTONIX) 40 MG tablet   Oral   Take 40 mg by mouth 2 (two) times daily.          . potassium chloride SA (K-DUR,KLOR-CON) 20 MEQ tablet   Oral   Take 20 mEq by mouth daily.         Marland Kitchen tiotropium (SPIRIVA) 18 MCG inhalation capsule   Inhalation   Place 1 capsule (18 mcg total) into inhaler and inhale daily.   30 capsule   6   . EXPIRED: albuterol (PROVENTIL) 2 MG tablet   Oral   Take 2 mg by mouth 3 (three) times daily.           BP 124/60  Pulse 80  Temp(Src) 97.3 F (36.3 C)  Resp 18  SpO2 96%  Physical Exam  Nursing note and vitals reviewed. Constitutional: He is oriented to person, place, and time. He appears well-developed and well-nourished. No distress.  HENT:  Head: Normocephalic and atraumatic.  Right Ear: External ear normal.  Left Ear: External ear normal.  Nose: Nose normal.  Eyes: Conjunctivae are normal.  Neck: Normal range of motion. No tracheal deviation present.  Cardiovascular: Normal rate, regular rhythm, normal heart sounds and intact distal pulses.   Pulses:      Dorsalis pedis pulses are 2+ on the right side, and 2+ on the left side.  Pulmonary/Chest: Effort normal and breath sounds normal. No stridor.  Abdominal: Soft. He exhibits no distension. There is no tenderness.  Musculoskeletal: Normal range of motion.  Significant effusion of right lateral ankle.   Neurological: He is alert and oriented to person, place, and time.  Skin: Skin is warm and dry. He is not diaphoretic.  Psychiatric: He has a normal mood and affect. His behavior is normal.    ED Course  Procedures (including critical care time) Medications  HYDROcodone-acetaminophen (NORCO/VICODIN) 5-325 MG per tablet 1 tablet (not administered)  ketorolac (TORADOL) injection 60 mg (60 mg Intramuscular Given  12/18/12 1622)  5:00 PM Dr. Manus Gunning examined pt and agrees with plan   Labs Reviewed - No data to display Dg Ankle Complete Right  12/18/2012  *RADIOLOGY REPORT*  Clinical Data: Right ankle pain and swelling  RIGHT ANKLE - COMPLETE 3+ VIEW  Comparison: July 25, 2012.  Findings: No fracture or dislocation is noted.  Joint spaces are intact.  No significant soft tissue abnormality is noted.  IMPRESSION: No acute abnormality seen in right ankle.   Original Report Authenticated By: Lupita Raider.,  M.D.      1. Gout       MDM  Patient presents today with acute onset of right ankle pain and swelling without trauma. X-ray of ankle shows no acute abnormality. Afebrile. The concern for septic joint at this time. His presentation is consistent with gout. Due to some chronic kidney disease I have prescribed him Norco for the pain. Make an appointment with your primary care provider next week. Return instructions given. Vital signs stable for discharge. Patient / Family / Caregiver informed of clinical course, understand medical decision-making process, and agree with plan.       I personally performed the services described in this documentation, which was scribed in my presence. The recorded information has been reviewed and is accurate.   Mora Bellman, PA-C 12/19/12 1446

## 2012-12-19 NOTE — ED Provider Notes (Signed)
Medical screening examination/treatment/procedure(s) were performed by non-physician practitioner and as supervising physician I was immediately available for consultation/collaboration.   Yohan Samons, MD 12/19/12 2324 

## 2013-01-06 ENCOUNTER — Emergency Department (HOSPITAL_COMMUNITY)
Admission: EM | Admit: 2013-01-06 | Discharge: 2013-01-06 | Disposition: A | Discharge status: Home / Self Care | Payer: Medicare Other | Attending: Emergency Medicine | Admitting: Emergency Medicine

## 2013-01-06 ENCOUNTER — Encounter (HOSPITAL_COMMUNITY): Payer: Self-pay | Admitting: Emergency Medicine

## 2013-01-06 DIAGNOSIS — Z87891 Personal history of nicotine dependence: Secondary | ICD-10-CM | POA: Insufficient documentation

## 2013-01-06 DIAGNOSIS — N182 Chronic kidney disease, stage 2 (mild): Secondary | ICD-10-CM | POA: Insufficient documentation

## 2013-01-06 DIAGNOSIS — I4891 Unspecified atrial fibrillation: Secondary | ICD-10-CM | POA: Insufficient documentation

## 2013-01-06 DIAGNOSIS — Z8679 Personal history of other diseases of the circulatory system: Secondary | ICD-10-CM | POA: Insufficient documentation

## 2013-01-06 DIAGNOSIS — Z862 Personal history of diseases of the blood and blood-forming organs and certain disorders involving the immune mechanism: Secondary | ICD-10-CM | POA: Insufficient documentation

## 2013-01-06 DIAGNOSIS — Z79899 Other long term (current) drug therapy: Secondary | ICD-10-CM | POA: Insufficient documentation

## 2013-01-06 DIAGNOSIS — Z8781 Personal history of (healed) traumatic fracture: Secondary | ICD-10-CM | POA: Insufficient documentation

## 2013-01-06 DIAGNOSIS — M109 Gout, unspecified: Secondary | ICD-10-CM | POA: Insufficient documentation

## 2013-01-06 DIAGNOSIS — J449 Chronic obstructive pulmonary disease, unspecified: Secondary | ICD-10-CM | POA: Insufficient documentation

## 2013-01-06 DIAGNOSIS — I129 Hypertensive chronic kidney disease with stage 1 through stage 4 chronic kidney disease, or unspecified chronic kidney disease: Secondary | ICD-10-CM | POA: Insufficient documentation

## 2013-01-06 DIAGNOSIS — J4489 Other specified chronic obstructive pulmonary disease: Secondary | ICD-10-CM | POA: Insufficient documentation

## 2013-01-06 DIAGNOSIS — Z8719 Personal history of other diseases of the digestive system: Secondary | ICD-10-CM | POA: Insufficient documentation

## 2013-01-06 DIAGNOSIS — I509 Heart failure, unspecified: Secondary | ICD-10-CM | POA: Insufficient documentation

## 2013-01-06 DIAGNOSIS — M810 Age-related osteoporosis without current pathological fracture: Secondary | ICD-10-CM | POA: Insufficient documentation

## 2013-01-06 DIAGNOSIS — Z8709 Personal history of other diseases of the respiratory system: Secondary | ICD-10-CM | POA: Insufficient documentation

## 2013-01-06 DIAGNOSIS — Z9981 Dependence on supplemental oxygen: Secondary | ICD-10-CM | POA: Insufficient documentation

## 2013-01-06 HISTORY — DX: Gout, unspecified: M10.9

## 2013-01-06 MED ORDER — HYDROCODONE-ACETAMINOPHEN 5-325 MG PO TABS: ORAL_TABLET | ORAL | Status: DC

## 2013-01-06 MED ORDER — KETOROLAC TROMETHAMINE 60 MG/2ML IM SOLN
60.0000 mg | Freq: Once | INTRAMUSCULAR | Status: AC
  Administered 2013-01-06: 60 mg via INTRAMUSCULAR
  Filled 2013-01-06: qty 2

## 2013-01-06 MED ORDER — INDOMETHACIN 50 MG PO CAPS: 50.0000 mg | ORAL_CAPSULE | Freq: Two times a day (BID) | ORAL | Status: DC

## 2013-01-06 MED ORDER — OXYCODONE-ACETAMINOPHEN 5-325 MG PO TABS
1.0000 | ORAL_TABLET | Freq: Once | ORAL | Status: AC
  Administered 2013-01-06: 1 via ORAL
  Filled 2013-01-06: qty 1

## 2013-01-06 NOTE — ED Provider Notes (Signed)
Medical screening examination/treatment/procedure(s) were conducted as a shared visit with non-physician practitioner(s) and myself.  I personally evaluated the patient during the encounter Elderly man with flare of gout in his left hand; he usually gets it in his feet.  Rx with Toradol IM, Indocin for gout and Norco for pain.  Carleene Cooper III, MD 01/06/13 [phone]

## 2013-01-06 NOTE — ED Provider Notes (Signed)
History     CSN: [id number]  Arrival date & time 01/06/13  1040   First MD Initiated Contact with Patient 01/06/13 1118      Chief Complaint  Patient presents with  . Hand Pain    left    (Consider location/radiation/quality/duration/timing/severity/associated sxs/prior treatment) HPI  [name] is a 77 y.o. male complaining of severe pain to left second digit MTP with swelling worsening over the course last 2 days. Patient exhibited the gout he has had a history of gout before but it is always affected right lower extremity. He has never had an exacerbation the hand before. He denies trauma, fever, nausea vomiting, warmth to the affected joint.   Past Medical History  Diagnosis Date  . Abdominal aortic aneurysm     s/p stent graft repair in 6/12  . Iliac artery aneurysm, bilateral   . Anemia   . Atrial fibrillation     no coumadin due to h/o GI bleeding. 05/2011 stopped  metoprolol, amiodarone, digoxin, diltiazem due to bradycardia   . Tibial plateau fracture     non displaced  . COPD (chronic obstructive pulmonary disease)     emphysema and severe outflow obstruction, on home O2  . Lung mass     right lower lobe spiculated mass  . Chronic kidney disease     Stage II  . Oxygen dependent   . CHF (congestive heart failure)     Diastolic Heart failure, Hx of severe MR;  Echocardiogram 05/01/11: Mild LVH, EF 55-60%, mild AI, severe MR, severe LAE, moderate RAE, normal RVSF, moderate-severe TR, PASP 79, severe pulmonary hypertension  . Hypertension   . Osteoporosis   . Alcoholic hepatitis   . Gout     Past Surgical History  Procedure Laterality Date  . Abdominal aortic aneurysm repair    . Bladder surgery    . Neck/back surgery      Family History  Problem Relation Age of Onset  . Emphysema Father   . Leukemia Father   . Heart disease Mother   . Rheum arthritis Mother   . Anemia Mother   . Hypertension Mother   . Coronary artery disease Mother      History  Substance Use Topics  . Smoking status: Former Smoker -- 0.50 packs/day for 20 years    Types: Cigarettes    Quit date: 08/26/2009  . Smokeless tobacco: Never Used  . Alcohol Use: No     Comment: history of ETOH abuse in the past      Review of Systems  Constitutional: Negative for fever.  Respiratory: Negative for shortness of breath.   Cardiovascular: Negative for chest pain.  Gastrointestinal: Negative for nausea, vomiting, abdominal pain and diarrhea.  Musculoskeletal: Positive for arthralgias.  All other systems reviewed and are negative.    Allergies  Review of patient's allergies indicates no known allergies.  Home Medications   Current Outpatient Rx  Name  Route  Sig  Dispense  Refill  . albuterol (PROVENTIL HFA;VENTOLIN HFA) 108 (90 BASE) MCG/ACT inhaler   Inhalation   Inhale 2 puffs into the lungs every 6 (six) hours as needed. For wheezing          . EXPIRED: albuterol (PROVENTIL) 2 MG tablet   Oral   Take 2 mg by mouth 3 (three) times daily.         Marland Kitchen amLODipine (NORVASC) 10 MG tablet   Oral   Take 10 mg by mouth daily.         Marland Kitchen  budesonide-formoterol (SYMBICORT) 160-4.5 MCG/ACT inhaler   Inhalation   Inhale 2 puffs into the lungs 2 (two) times daily.   1 Inhaler   6   . diltiazem (CARDIZEM CD) 180 MG 24 hr capsule   Oral   Take 180 mg by mouth daily.         . furosemide (LASIX) 40 MG tablet   Oral   Take 40 mg by mouth daily.         Marland Kitchen HYDROcodone-acetaminophen (NORCO/VICODIN) 5-325 MG per tablet   Oral   Take 1 tablet by mouth every 4 (four) hours as needed for pain.   12 tablet   0   . ibuprofen (ADVIL,MOTRIN) 600 MG tablet   Oral   Take 1 tablet (600 mg total) by mouth every 6 (six) hours as needed for pain.   30 tablet   0   . ipratropium-albuterol (DUONEB) 0.5-2.5 (3) MG/3ML SOLN   Inhalation   Inhale 1 vial into the lungs Every 4 hours as needed. For wheezing         . metoprolol (LOPRESSOR) 100 MG  tablet   Oral   Take 100 mg by mouth 2 (two) times daily.         Marland Kitchen oxyCODONE-acetaminophen (PERCOCET/ROXICET) 5-325 MG per tablet   Oral   Take 1-2 tablets by mouth every 6 (six) hours as needed for pain.   15 tablet   0   . pantoprazole (PROTONIX) 40 MG tablet   Oral   Take 40 mg by mouth 2 (two) times daily.          . potassium chloride SA (K-DUR,KLOR-CON) 20 MEQ tablet   Oral   Take 20 mEq by mouth daily.         . promethazine (PHENERGAN) 25 MG tablet   Oral   Take 1 tablet (25 mg total) by mouth every 6 (six) hours as needed for nausea.   12 tablet   0   . tiotropium (SPIRIVA) 18 MCG inhalation capsule   Inhalation   Place 1 capsule (18 mcg total) into inhaler and inhale daily.   30 capsule   6     BP 140/98  Pulse 79  Temp(Src) 97.5 F (36.4 C) (Oral)  Resp 18  SpO2 98%  Physical Exam  Nursing note and vitals reviewed. Constitutional: He is oriented to person, place, and time. He appears well-developed and well-nourished. No distress.  HENT:  Head: Normocephalic.  Eyes: Conjunctivae and EOM are normal.  Cardiovascular: Normal rate.   Pulmonary/Chest: Effort normal. No stridor.  Musculoskeletal: Normal range of motion.  Left first MTP is moderately swollen, trace warmth and erythema. Good range of motion. Neurovascularly intact. Very tender to palpation.  Neurological: He is alert and oriented to person, place, and time.  Psychiatric: He has a normal mood and affect.    ED Course  Procedures (including critical care time)  Labs Reviewed - No data to display No results found.   1. Gout       MDM   Filed Vitals:   01/06/13 1059  BP: 140/98  Pulse: 79  Temp: 97.5 F (36.4 C)  TempSrc: Oral  Resp: 18  SpO2: 98%     [name] is a 77 y.o. male with pain, swelling and trace warmth to left second digit MTP. Patient states that this feels like prior gout exacerbations however he has never had an attack at this location before. I  discussed this with attending Dr.  Ignacia Palma, this is a shared visit and he personally evaluated the patient. He recommends initiating gout medication indomethacin. He advises it is not necessary to check uric acid or creatinine beforehand this patient was seen for similar last month and had a Toradol shot with no adverse reactions.  Medications  oxyCODONE-acetaminophen (PERCOCET/ROXICET) 5-325 MG per tablet 1 tablet (not administered)  ketorolac (TORADOL) injection 60 mg (not administered)    VS wnl and patient is appropriate for, and amenable to, discharge at this time. Pt verbalized understanding and agrees with care plan. Outpatient follow-up and return precautions given.    New Prescriptions   HYDROCODONE-ACETAMINOPHEN (NORCO/VICODIN) 5-325 MG PER TABLET    Take 1-2 tablets by mouth every 6 hours as needed for pain.   INDOMETHACIN (INDOCIN) 50 MG CAPSULE    Take 1 capsule (50 mg total) by mouth 2 (two) times daily with a meal.           Wynetta Emery, PA-C 01/06/13 1141

## 2013-01-06 NOTE — ED Notes (Signed)
Pt c/o left hand pain onset yesterday evening. Pt denies injury. Pt presents with redness and swelling to left hand. Pt has history of gout.

## 2013-02-04 ENCOUNTER — Ambulatory Visit: Payer: Medicare Other | Admitting: Neurosurgery

## 2013-02-11 ENCOUNTER — Ambulatory Visit: Payer: Medicare Other | Admitting: Pulmonary Disease

## 2013-02-19 ENCOUNTER — Ambulatory Visit: Payer: Medicare Other | Admitting: Pulmonary Disease

## 2013-03-05 ENCOUNTER — Ambulatory Visit: Payer: Medicare Other | Admitting: Pulmonary Disease

## 2013-04-13 ENCOUNTER — Emergency Department (HOSPITAL_COMMUNITY)
Admission: EM | Admit: 2013-04-13 | Discharge: 2013-04-13 | Disposition: A | Discharge status: Home / Self Care | Payer: Medicare Other | Attending: Emergency Medicine | Admitting: Emergency Medicine

## 2013-04-13 ENCOUNTER — Encounter (HOSPITAL_COMMUNITY): Payer: Self-pay | Admitting: Nurse Practitioner

## 2013-04-13 DIAGNOSIS — M7989 Other specified soft tissue disorders: Secondary | ICD-10-CM | POA: Insufficient documentation

## 2013-04-13 DIAGNOSIS — Z9981 Dependence on supplemental oxygen: Secondary | ICD-10-CM | POA: Insufficient documentation

## 2013-04-13 DIAGNOSIS — I129 Hypertensive chronic kidney disease with stage 1 through stage 4 chronic kidney disease, or unspecified chronic kidney disease: Secondary | ICD-10-CM | POA: Insufficient documentation

## 2013-04-13 DIAGNOSIS — J449 Chronic obstructive pulmonary disease, unspecified: Secondary | ICD-10-CM | POA: Insufficient documentation

## 2013-04-13 DIAGNOSIS — Z8719 Personal history of other diseases of the digestive system: Secondary | ICD-10-CM | POA: Insufficient documentation

## 2013-04-13 DIAGNOSIS — Z8669 Personal history of other diseases of the nervous system and sense organs: Secondary | ICD-10-CM | POA: Insufficient documentation

## 2013-04-13 DIAGNOSIS — I5030 Unspecified diastolic (congestive) heart failure: Secondary | ICD-10-CM | POA: Insufficient documentation

## 2013-04-13 DIAGNOSIS — Z8709 Personal history of other diseases of the respiratory system: Secondary | ICD-10-CM | POA: Insufficient documentation

## 2013-04-13 DIAGNOSIS — M109 Gout, unspecified: Secondary | ICD-10-CM | POA: Insufficient documentation

## 2013-04-13 DIAGNOSIS — Z8739 Personal history of other diseases of the musculoskeletal system and connective tissue: Secondary | ICD-10-CM | POA: Insufficient documentation

## 2013-04-13 DIAGNOSIS — J4489 Other specified chronic obstructive pulmonary disease: Secondary | ICD-10-CM | POA: Insufficient documentation

## 2013-04-13 DIAGNOSIS — Z87891 Personal history of nicotine dependence: Secondary | ICD-10-CM | POA: Insufficient documentation

## 2013-04-13 DIAGNOSIS — Z79899 Other long term (current) drug therapy: Secondary | ICD-10-CM | POA: Insufficient documentation

## 2013-04-13 DIAGNOSIS — Z9889 Other specified postprocedural states: Secondary | ICD-10-CM | POA: Insufficient documentation

## 2013-04-13 DIAGNOSIS — N182 Chronic kidney disease, stage 2 (mild): Secondary | ICD-10-CM | POA: Insufficient documentation

## 2013-04-13 DIAGNOSIS — Z8781 Personal history of (healed) traumatic fracture: Secondary | ICD-10-CM | POA: Insufficient documentation

## 2013-04-13 DIAGNOSIS — Z8679 Personal history of other diseases of the circulatory system: Secondary | ICD-10-CM | POA: Insufficient documentation

## 2013-04-13 DIAGNOSIS — Z862 Personal history of diseases of the blood and blood-forming organs and certain disorders involving the immune mechanism: Secondary | ICD-10-CM | POA: Insufficient documentation

## 2013-04-13 MED ORDER — KETOROLAC TROMETHAMINE 60 MG/2ML IM SOLN
60.0000 mg | Freq: Once | INTRAMUSCULAR | Status: AC
  Administered 2013-04-13: 60 mg via INTRAMUSCULAR
  Filled 2013-04-13: qty 2

## 2013-04-13 MED ORDER — COLCHICINE 0.6 MG PO TABS: 0.6000 mg | ORAL_TABLET | Freq: Two times a day (BID) | ORAL | Status: DC

## 2013-04-13 NOTE — ED Notes (Addendum)
C/o r foot pain since last night, states it hurts to walk, denies any injuries. Hx gout, feels the same

## 2013-04-13 NOTE — ED Provider Notes (Signed)
CSN: [id number]     Arrival date & time 04/13/13  1222 History     First MD Initiated Contact with Patient 04/13/13 1411     Chief Complaint  Patient presents with  . Foot Pain   (Consider location/radiation/quality/duration/timing/severity/associated sxs/prior Treatment) HPI Comments: 77 year old male with a history of gouty arthritis presents with a complaint of right foot pain. This is persistent, came on this morning when he woke up, is associated with mild redness and swelling of the right foot. He denies any injury or trauma to the foot but has noticed that it has been difficult to walk on the foot this morning. There is no fevers, no recent surgery, no infectious exposures, no injuries to the skin. The patient is a history of gouty arthritis which has been similar to this in the past. He has not had any medications prior to arrival.  Patient is a 77 y.o. male presenting with lower extremity pain. The history is provided by the patient.  Foot Pain    Past Medical History  Diagnosis Date  . Abdominal aortic aneurysm     s/p stent graft repair in 6/12  . Iliac artery aneurysm, bilateral   . Anemia   . Atrial fibrillation     no coumadin due to h/o GI bleeding. 05/2011 stopped  metoprolol, amiodarone, digoxin, diltiazem due to bradycardia   . Tibial plateau fracture     non displaced  . COPD (chronic obstructive pulmonary disease)     emphysema and severe outflow obstruction, on home O2  . Lung mass     right lower lobe spiculated mass  . Chronic kidney disease     Stage II  . Oxygen dependent   . CHF (congestive heart failure)     Diastolic Heart failure, Hx of severe MR;  Echocardiogram 05/01/11: Mild LVH, EF 55-60%, mild AI, severe MR, severe LAE, moderate RAE, normal RVSF, moderate-severe TR, PASP 79, severe pulmonary hypertension  . Hypertension   . Osteoporosis   . Alcoholic hepatitis   . Gout    Past Surgical History  Procedure Laterality Date  . Abdominal aortic  aneurysm repair    . Bladder surgery    . Neck/back surgery     Family History  Problem Relation Age of Onset  . Emphysema Father   . Leukemia Father   . Heart disease Mother   . Rheum arthritis Mother   . Anemia Mother   . Hypertension Mother   . Coronary artery disease Mother    History  Substance Use Topics  . Smoking status: Former Smoker -- 0.50 packs/day for 20 years    Types: Cigarettes    Quit date: 08/26/2009  . Smokeless tobacco: Never Used  . Alcohol Use: Yes     Comment: rare    Review of Systems  All other systems reviewed and are negative.    Allergies  Review of patient's allergies indicates no known allergies.  Home Medications   Current Outpatient Rx  Name  Route  Sig  Dispense  Refill  . albuterol (PROVENTIL HFA;VENTOLIN HFA) 108 (90 BASE) MCG/ACT inhaler   Inhalation   Inhale 2 puffs into the lungs every 6 (six) hours as needed. For wheezing          . amLODipine (NORVASC) 10 MG tablet   Oral   Take 10 mg by mouth daily.         . furosemide (LASIX) 40 MG tablet   Oral   Take 40 mg  by mouth daily.         Marland Kitchen HYDROcodone-acetaminophen (NORCO/VICODIN) 5-325 MG per tablet      Take 1-2 tablets by mouth every 6 hours as needed for pain.   15 tablet   0   . pantoprazole (PROTONIX) 40 MG tablet   Oral   Take 40 mg by mouth daily.          . potassium chloride SA (K-DUR,KLOR-CON) 20 MEQ tablet   Oral   Take 20 mEq by mouth daily.         Marland Kitchen PRESCRIPTION MEDICATION   Oral   Take 1 tablet by mouth daily. Metoprolol PO.         . tiotropium (SPIRIVA) 18 MCG inhalation capsule   Inhalation   Place 18 mcg into inhaler and inhale daily.         . colchicine 0.6 MG tablet   Oral   Take 1 tablet (0.6 mg total) by mouth 2 (two) times daily.   8 tablet   0    BP 120/75  Pulse 84  Temp(Src) 98.5 F (36.9 C) (Oral)  Resp 14  SpO2 95% Physical Exam  Nursing note and vitals reviewed. Constitutional: He appears  well-developed and well-nourished. No distress.  HENT:  Head: Normocephalic and atraumatic.  Mouth/Throat: Oropharynx is clear and moist. No oropharyngeal exudate.  Eyes: Conjunctivae and EOM are normal. Pupils are equal, round, and reactive to light. Right eye exhibits no discharge. Left eye exhibits no discharge. No scleral icterus.  Neck: Normal range of motion. Neck supple.  Cardiovascular: Normal rate, regular rhythm, normal heart sounds and intact distal pulses.  Exam reveals no gallop and no friction rub.   No murmur heard. Pulmonary/Chest: Effort normal.  Abdominal: There is no tenderness.  Musculoskeletal: Normal range of motion. He exhibits tenderness (mild tenderness and swelling with redness to the mid right foot, no tenderness over the ankle, no pain with range of motion of the toes.). He exhibits no edema.  Neurological: He is alert. Coordination normal.  Normal sensation and strength of the right and left lower extremities below the knee  Skin: Skin is warm and dry. No rash noted. No erythema.  Scant erythema overlying the dorsum of the right foot, no induration  Psychiatric: He has a normal mood and affect. His behavior is normal.    ED Course   Procedures (including critical care time)  Labs Reviewed - No data to display No results found. 1. Acute gouty arthritis     MDM  The patient appears to have a flare of acute gouty arthritis of the right foot, this is consistent with the patient's history. He does not have a fever tachycardia and has no history of septic arthritis. I doubt that this is a septic joint and at this time will be treated with pain medications, discharged home to followup with his family doctor with a prescription for colchicine.   Meds given in ED:  Medications  ketorolac (TORADOL) injection 60 mg (not administered)    New Prescriptions   COLCHICINE 0.6 MG TABLET    Take 1 tablet (0.6 mg total) by mouth 2 (two) times daily.      Vida Roller, MD 04/13/13 [phone]

## 2013-04-15 ENCOUNTER — Ambulatory Visit: Payer: Medicare Other | Admitting: Pulmonary Disease

## 2015-03-11 ENCOUNTER — Emergency Department (HOSPITAL_COMMUNITY): Payer: Medicare Other

## 2015-03-11 ENCOUNTER — Encounter (HOSPITAL_COMMUNITY): Payer: Self-pay | Admitting: *Deleted

## 2015-03-11 ENCOUNTER — Emergency Department (HOSPITAL_COMMUNITY)
Admission: EM | Admit: 2015-03-11 | Discharge: 2015-03-11 | Disposition: A | Discharge status: Home / Self Care | Payer: Medicare Other | Attending: Emergency Medicine | Admitting: Emergency Medicine

## 2015-03-11 DIAGNOSIS — M25462 Effusion, left knee: Secondary | ICD-10-CM | POA: Diagnosis not present

## 2015-03-11 DIAGNOSIS — M25562 Pain in left knee: Secondary | ICD-10-CM | POA: Diagnosis present

## 2015-03-11 DIAGNOSIS — M109 Gout, unspecified: Secondary | ICD-10-CM | POA: Insufficient documentation

## 2015-03-11 DIAGNOSIS — Z862 Personal history of diseases of the blood and blood-forming organs and certain disorders involving the immune mechanism: Secondary | ICD-10-CM | POA: Diagnosis not present

## 2015-03-11 DIAGNOSIS — I509 Heart failure, unspecified: Secondary | ICD-10-CM | POA: Insufficient documentation

## 2015-03-11 DIAGNOSIS — Z8781 Personal history of (healed) traumatic fracture: Secondary | ICD-10-CM | POA: Diagnosis not present

## 2015-03-11 DIAGNOSIS — I129 Hypertensive chronic kidney disease with stage 1 through stage 4 chronic kidney disease, or unspecified chronic kidney disease: Secondary | ICD-10-CM | POA: Diagnosis not present

## 2015-03-11 DIAGNOSIS — Z8719 Personal history of other diseases of the digestive system: Secondary | ICD-10-CM | POA: Insufficient documentation

## 2015-03-11 DIAGNOSIS — N182 Chronic kidney disease, stage 2 (mild): Secondary | ICD-10-CM | POA: Diagnosis not present

## 2015-03-11 DIAGNOSIS — J449 Chronic obstructive pulmonary disease, unspecified: Secondary | ICD-10-CM | POA: Diagnosis not present

## 2015-03-11 DIAGNOSIS — Z79899 Other long term (current) drug therapy: Secondary | ICD-10-CM | POA: Insufficient documentation

## 2015-03-11 DIAGNOSIS — I4891 Unspecified atrial fibrillation: Secondary | ICD-10-CM | POA: Diagnosis not present

## 2015-03-11 LAB — CBC WITH DIFFERENTIAL/PLATELET
Basophils Absolute: 0 10*3/uL (ref 0.0–0.1)
Basophils Relative: 0 % (ref 0–1)
EOS ABS: 0 10*3/uL (ref 0.0–0.7)
EOS PCT: 0 % (ref 0–5)
HCT: 40.1 % (ref 39.0–52.0)
HEMOGLOBIN: 13.3 g/dL (ref 13.0–17.0)
LYMPHS ABS: 0.8 10*3/uL (ref 0.7–4.0)
LYMPHS PCT: 9 % — AB (ref 12–46)
MCH: 31.1 pg (ref 26.0–34.0)
MCHC: 33.2 g/dL (ref 30.0–36.0)
MCV: 93.7 fL (ref 78.0–100.0)
MONOS PCT: 7 % (ref 3–12)
Monocytes Absolute: 0.6 10*3/uL (ref 0.1–1.0)
NEUTROS PCT: 84 % — AB (ref 43–77)
Neutro Abs: 7.3 10*3/uL (ref 1.7–7.7)
Platelets: 184 10*3/uL (ref 150–400)
RBC: 4.28 MIL/uL (ref 4.22–5.81)
RDW: 13.7 % (ref 11.5–15.5)
WBC: 8.7 10*3/uL (ref 4.0–10.5)

## 2015-03-11 LAB — SYNOVIAL CELL COUNT + DIFF, W/ CRYSTALS
Crystals, Fluid: NONE SEEN
LYMPHOCYTES-SYNOVIAL FLD: 8 % (ref 0–20)
NEUTROPHIL, SYNOVIAL: 92 % — AB (ref 0–25)
WBC, Synovial: 320 /mm3 — ABNORMAL HIGH (ref 0–200)

## 2015-03-11 LAB — BASIC METABOLIC PANEL
Anion gap: 13 (ref 5–15)
BUN: 48 mg/dL — AB (ref 6–20)
CALCIUM: 9.5 mg/dL (ref 8.9–10.3)
CO2: 26 mmol/L (ref 22–32)
CREATININE: 2.3 mg/dL — AB (ref 0.61–1.24)
Chloride: 99 mmol/L — ABNORMAL LOW (ref 101–111)
GFR, EST AFRICAN AMERICAN: 30 mL/min — AB (ref >60)
GFR, EST NON AFRICAN AMERICAN: 26 mL/min — AB (ref >60)
Glucose, Bld: 95 mg/dL (ref 65–99)
Potassium: 4.3 mmol/L (ref 3.5–5.1)
Sodium: 138 mmol/L (ref 135–145)

## 2015-03-11 MED ORDER — OXYCODONE-ACETAMINOPHEN 5-325 MG PO TABS
1.0000 | ORAL_TABLET | Freq: Once | ORAL | Status: AC
Start: 2015-03-11 — End: 2015-03-11
  Administered 2015-03-11: 1 via ORAL
  Filled 2015-03-11: qty 1

## 2015-03-11 MED ORDER — PREDNISONE 10 MG PO TABS: 30.0000 mg | ORAL_TABLET | Freq: Every day | ORAL | Status: DC

## 2015-03-11 MED ORDER — COLCHICINE 0.6 MG PO TABS
0.6000 mg | ORAL_TABLET | Freq: Once | ORAL | Status: AC
  Administered 2015-03-11: 0.6 mg via ORAL
  Filled 2015-03-11: qty 1

## 2015-03-11 MED ORDER — LIDOCAINE HCL 1 % IJ SOLN
INTRAMUSCULAR | Status: AC
  Administered 2015-03-11: 20 mL
  Filled 2015-03-11: qty 20

## 2015-03-11 MED ORDER — OXYCODONE-ACETAMINOPHEN 5-325 MG PO TABS
1.0000 | ORAL_TABLET | Freq: Once | ORAL | Status: AC
  Administered 2015-03-11: 1 via ORAL
  Filled 2015-03-11: qty 1

## 2015-03-11 MED ORDER — HYDROCODONE-ACETAMINOPHEN 5-325 MG PO TABS: 1.0000 | ORAL_TABLET | Freq: Four times a day (QID) | ORAL | Status: DC | PRN

## 2015-03-11 NOTE — ED Provider Notes (Signed)
CSN: [id number]     Arrival date & time 03/11/15  0856 History   First MD Initiated Contact with Patient 03/11/15 [phone]     Chief Complaint  Patient presents with  . Knee Pain     Patient is a 79 y.o. male presenting with knee pain. The history is provided by the patient. No language interpreter was used.  Knee Pain  [name] presents for evaluation of left knee pain. He states that he's had several days of pain and swelling in the left knee. Pain is worse with range of motion and ambulation. He denies any fevers or injuries. He has a history of gout in his ankle and toes but this feels different than his prior gout. He feels like it is full of fluid. He has not tried any new medications for the pain but he is on chronic gout medication. He is not sure the name of the medication. Symptoms are moderate, constant, worsening.  Past Medical History  Diagnosis Date  . Abdominal aortic aneurysm     s/p stent graft repair in 6/12  . Iliac artery aneurysm, bilateral   . Anemia   . Atrial fibrillation     no coumadin due to h/o GI bleeding. 05/2011 stopped  metoprolol, amiodarone, digoxin, diltiazem due to bradycardia   . Tibial plateau fracture     non displaced  . COPD (chronic obstructive pulmonary disease)     emphysema and severe outflow obstruction, on home O2  . Lung mass     right lower lobe spiculated mass  . Chronic kidney disease     Stage II  . Oxygen dependent   . CHF (congestive heart failure)     Diastolic Heart failure, Hx of severe MR;  Echocardiogram 05/01/11: Mild LVH, EF 55-60%, mild AI, severe MR, severe LAE, moderate RAE, normal RVSF, moderate-severe TR, PASP 79, severe pulmonary hypertension  . Hypertension   . Osteoporosis   . Alcoholic hepatitis   . Gout    Past Surgical History  Procedure Laterality Date  . Abdominal aortic aneurysm repair    . Bladder surgery    . Neck/back surgery     Family History  Problem Relation Age of Onset  . Emphysema Father   .  Leukemia Father   . Heart disease Mother   . Rheum arthritis Mother   . Anemia Mother   . Hypertension Mother   . Coronary artery disease Mother    History  Substance Use Topics  . Smoking status: Former Smoker -- 0.50 packs/day for 20 years    Types: Cigarettes    Quit date: 08/26/2009  . Smokeless tobacco: Never Used  . Alcohol Use: Yes     Comment: rare    Review of Systems  All other systems reviewed and are negative.     Allergies  Review of patient's allergies indicates no known allergies.  Home Medications   Prior to Admission medications   Medication Sig Start Date End Date Taking? Authorizing Provider  albuterol (PROVENTIL HFA;VENTOLIN HFA) 108 (90 BASE) MCG/ACT inhaler Inhale 2 puffs into the lungs every 6 (six) hours as needed. For wheezing     Historical Provider, MD  amLODipine (NORVASC) 10 MG tablet Take 10 mg by mouth daily.    Historical Provider, MD  colchicine 0.6 MG tablet Take 1 tablet (0.6 mg total) by mouth 2 (two) times daily. 04/13/13   Noemi Chapel, MD  furosemide (LASIX) 40 MG tablet Take 40 mg by mouth daily.  Historical Provider, MD  HYDROcodone-acetaminophen (NORCO/VICODIN) 5-325 MG per tablet Take 1-2 tablets by mouth every 6 hours as needed for pain. 01/06/13   Nicole Pisciotta, PA-C  pantoprazole (PROTONIX) 40 MG tablet Take 40 mg by mouth daily.     Historical Provider, MD  potassium chloride SA (K-DUR,KLOR-CON) 20 MEQ tablet Take 20 mEq by mouth daily.    Historical Provider, MD  PRESCRIPTION MEDICATION Take 1 tablet by mouth daily. Metoprolol PO.    Historical Provider, MD  tiotropium (SPIRIVA) 18 MCG inhalation capsule Place 18 mcg into inhaler and inhale daily.    Historical Provider, MD   BP 135/75 mmHg  Pulse 73  Temp(Src) 97.7 F (36.5 C) (Oral)  Resp 18  SpO2 97% Physical Exam  Constitutional: He is oriented to person, place, and time. He appears well-developed and well-nourished.  HENT:  Head: Normocephalic and atraumatic.   Cardiovascular: Normal rate and regular rhythm.   No murmur heard. Pulmonary/Chest: Effort normal and breath sounds normal. No respiratory distress.  Abdominal: Soft. There is no tenderness. There is no rebound and no guarding.  Musculoskeletal:  Large effusion of the left knee with minimal warmth. Patient has mild tenderness over the knee. He has difficulty with extension secondary to pain and swelling. No overlying cellulitis.  Neurological: He is alert and oriented to person, place, and time.  Skin: Skin is warm and dry.  Psychiatric: He has a normal mood and affect. His behavior is normal.  Nursing note and vitals reviewed.   ED Course  ARTHOCENTESIS Date/Time: 03/11/2015 11:37 AM Performed by: [name] Authorized by: [name] Consent: Verbal consent obtained. Risks and benefits: risks, benefits and alternatives were discussed Consent given by: patient Patient understanding: patient states understanding of the procedure being performed Patient consent: the patient's understanding of the procedure matches consent given Test results: test results available and properly labeled Imaging studies: imaging studies available Patient identity confirmed: verbally with patient and arm band Time out: Immediately prior to procedure a "time out" was called to verify the correct patient, procedure, equipment, support staff and site/side marked as required. Indications: joint swelling  Body area: knee Joint: left knee Local anesthesia used: yes Anesthesia: local infiltration Local anesthetic: lidocaine 1% without epinephrine Anesthetic total: 3 ml Patient sedated: no Preparation: Patient was prepped and draped in the usual sterile fashion. Needle gauge: 18 G Ultrasound guidance: no Approach: medial Aspirate: yellow and cloudy Aspirate amount: 85 mL Lidocaine 1% amount: 1 ml Patient tolerance: Patient tolerated the procedure well with no immediate complications    (including critical care time) Labs Review Labs Reviewed  BASIC METABOLIC PANEL - Abnormal; Notable for the following:    Chloride 99 (*)    BUN 48 (*)    Creatinine, Ser 2.30 (*)    GFR calc non Af Amer 26 (*)    GFR calc Af Amer 30 (*)    All other components within normal limits  CBC WITH DIFFERENTIAL/PLATELET - Abnormal; Notable for the following:    Neutrophils Relative % 84 (*)    Lymphocytes Relative 9 (*)    All other components within normal limits  BODY FLUID CULTURE  GRAM STAIN  SYNOVIAL CELL COUNT + DIFF, W/ CRYSTALS  GLUCOSE, SYNOVIAL FLUID    Imaging Review Dg Knee Complete 4 Views Left  03/11/2015   CLINICAL DATA:  Pain.  No known injury.  EXAM: LEFT KNEE - COMPLETE 4+ VIEW  COMPARISON:  11/17/2011  FINDINGS: There is a large suprapatellar joint effusion. There  is no acute fracture or subluxation. Marked medial compartment joint space narrowing. There is mild patellofemoral and lateral compartment degenerative change.  IMPRESSION: 1. Large joint effusion. 2. Marked medial compartment osteoarthritis.   Electronically Signed   By: Kerby Moors M.D.   On: 03/11/2015 09:49     EKG Interpretation None      MDM   Final diagnoses:  Knee effusion, left    Patient here for evaluation of knee pain and swelling, has history of renal insufficiency as well as gout. Exam is not consistent with cellulitis. Therapeutic and diagnostic arthrocentesis performed. [place] is not consistent with septic or gouty arthritis. Treating with steroids with outpatient PCP and oriented follow-up. Patient has chronic renal insufficiency, labs similar compared to priors.    [name] Reichert, MD 03/11/15 1534

## 2015-03-11 NOTE — ED Notes (Addendum)
Patient has been having severe left knee pain for the past few days. Patient states he is on medication for gout, but the pain has been unbearable. Patient uses cane at all times for ambulation. Patient has not taken any form of pain medication, heat, or ice. Patient takes "fluid pills" and assumed that this was the problem. The knee is swollen and very warm to touch. Patient denies injury. Ambulation has been difficult due to the pain.

## 2015-03-11 NOTE — ED Notes (Signed)
Patient in radiology

## 2015-03-11 NOTE — Discharge Instructions (Signed)
Please follow up with your family doctor or kidney doctor for recheck of your kidney function.    Knee Effusion The medical term for having fluid in your knee is effusion. This is often due to an internal derangement of the knee. This means something is wrong inside the knee. Some of the causes of fluid in the knee may be torn cartilage, a torn ligament, or bleeding into the joint from an injury. Your knee is likely more difficult to bend and move. This is often because there is increased pain and pressure in the joint. The time it takes for recovery from a knee effusion depends on different factors, including:   Type of injury.  Your age.  Physical and medical conditions.  Rehabilitation Strategies. How long you will be away from your normal activities will depend on what kind of knee problem you have and how much damage is present. Your knee has two types of cartilage. Articular cartilage covers the bone ends and lets your knee bend and move smoothly. Two menisci, thick pads of cartilage that form a rim inside the joint, help absorb shock and stabilize your knee. Ligaments bind the bones together and support your knee joint. Muscles move the joint, help support your knee, and take stress off the joint itself. CAUSES  Often an effusion in the knee is caused by an injury to one of the menisci. This is often a tear in the cartilage. Recovery after a meniscus injury depends on how much meniscus is damaged and whether you have damaged other knee tissue. Small tears may heal on their own with conservative treatment. Conservative means rest, limited weight bearing activity and muscle strengthening exercises. Your recovery may take up to 6 weeks.  TREATMENT  Larger tears may require surgery. Meniscus injuries may be treated during arthroscopy. Arthroscopy is a procedure in which your surgeon uses a small telescope like instrument to look in your knee. Your caregiver can make a more accurate diagnosis  (learning what is wrong) by performing an arthroscopic procedure. If your injury is on the inner margin of the meniscus, your surgeon may trim the meniscus back to a smooth rim. In other cases your surgeon will try to repair a damaged meniscus with stitches (sutures). This may make rehabilitation take longer, but may provide better long term result by helping your knee keep its shock absorption capabilities. Ligaments which are completely torn usually require surgery for repair. HOME CARE INSTRUCTIONS  Use crutches as instructed.  If a brace is applied, use as directed.  Once you are home, an ice pack applied to your swollen knee may help with discomfort and help decrease swelling.  Keep your knee raised (elevated) when you are not up and around or on crutches.  Only take over-the-counter or prescription medicines for pain, discomfort, or fever as directed by your caregiver.  Your caregivers will help with instructions for rehabilitation of your knee. This often includes strengthening exercises.  You may resume a normal diet and activities as directed. SEEK MEDICAL CARE IF:   There is increased swelling in your knee.  You notice redness, swelling, or increasing pain in your knee.  An unexplained oral temperature above 102 F (38.9 C) develops. SEEK IMMEDIATE MEDICAL CARE IF:   You develop a rash.  You have difficulty breathing.  You have any allergic reactions from medications you may have been given.  There is severe pain with any motion of the knee. MAKE SURE YOU:   Understand these instructions.  Will watch your condition.  Will get help right away if you are not doing well or get worse. Document Released: 11/02/2003 Document Revised: 11/04/2011 Document Reviewed: 01/06/2008 [facility] Patient Information 2015 [place], [place]. This information is not intended to replace advice given to you by your health care provider. Make sure you discuss any questions you have with your  health care provider.

## 2015-03-12 LAB — GRAM STAIN: Gram Stain: NONE SEEN

## 2015-03-14 LAB — GLUCOSE, SYNOVIAL FLUID

## 2015-03-16 LAB — CULTURE, BODY FLUID-BOTTLE: CULTURE: NO GROWTH

## 2015-03-16 LAB — CULTURE, BODY FLUID W GRAM STAIN -BOTTLE

## 2017-03-03 NOTE — Progress Notes (Signed)
[name] is a 81 y.o. male here to establish care and bilateral knee pain.  I acted as a Education administrator for [employer], PA-C [name] Pickler, LPN  History of Present Illness:   Chief Complaint  Patient presents with  . Establish Care    [name]  . Knee Pain    Bilateral, Left > RIght   [name] is here to establish care. He sees [name] at [name] who has been prescribing most of his meds, and he reports that she told him he needs to find a PCP to help manage all of his medications. He states that it has been awhile since he has had a physical.  Acute Concerns: Bilateral knee pain -- patient reports a long history of knee pain. He has a history of gout and knee infusions requiring arthrocentesis, most recently in July 2016. He is on 100 mg allopurinol BID. He has a history of CKD, managed by [name], I am unable to review most recent labs, but most recent BMP from July 2016 shows GFR of 30. He tells me that he has been told that he needs bilateral knee surgery however he cannot have knee surgery because he was told that he is "too old." He denies prior joint infection or sepsis. Most recent knee xray available to me is from 03/11/15 -- showed a joint effusion at the time as well as "marked medial compartment osteoarthritis." He uses a cane at baseline but has a walker available to him. He has had knee injections in the past that has helped him and he is interested in this today.  Health Maintenance: Immunizations -- prevnar due today, he is agreeable Weight -- Weight: 139 lb 8 oz (63.3 kg)   Depression screen [name] 2/9 03/04/2017  Decreased Interest 0  Down, Depressed, Hopeless 0  PHQ - 2 Score 0    No flowsheet data found.  Other providers/specialists: [name] --  [facility]   Past Medical History:  Diagnosis Date  . Abdominal aortic aneurysm (HCC)    s/p stent graft repair in 6/12  .  Alcoholic hepatitis   . Allergy   . Anemia   . Atrial fibrillation (HCC)    no coumadin due to h/o GI bleeding. 05/2011 stopped  metoprolol, amiodarone, digoxin, diltiazem due to bradycardia   . Blood transfusion without reported diagnosis   . CHF (congestive heart failure) (HCC)    Diastolic Heart failure, Hx of severe MR;  Echocardiogram 05/01/11: Mild LVH, EF 55-60%, mild AI, severe MR, severe LAE, moderate RAE, normal RVSF, moderate-severe TR, PASP 79, severe pulmonary hypertension  . Chronic kidney disease    Stage II  . COPD (chronic obstructive pulmonary disease) (HCC)    emphysema and severe outflow obstruction, on home O2  . Gout   . Hypertension   . Iliac artery aneurysm, bilateral ([name])   . Lung mass    right lower lobe spiculated mass  . Osteoporosis   . Oxygen dependent   . Tibial plateau fracture    non displaced     Social History   Social History  . Marital status: Single    Spouse name: N/A  . Number of children: 0  . Years of education: N/A   Occupational History  . retired Retired   Social History Main Topics  . Smoking status: Former Smoker    Packs/day: 0.50    Years: 20.00    Types: Cigarettes  Quit date: 08/26/2009  . Smokeless tobacco: Never Used  . Alcohol use Yes     Comment: rare  . Drug use: No  . Sexual activity: Yes   Other Topics Concern  . Not on file   Social History Narrative   Lives alone, but has a sister across the street and a brother also. Never married, no kids. Ambulatory with a cane and walker. Denies smoking, drinking, drugs at presents. Previously had visiting nurse but not now.       Used to work Primary school teacher. Highest level of education is 8th grade.    Past Surgical History:  Procedure Laterality Date  . ABDOMINAL AORTIC ANEURYSM REPAIR    . BLADDER SURGERY    . neck/back surgery      Family History  Problem Relation Age of Onset  . Emphysema Father   . Leukemia Father   . Heart disease Mother   . Rheum  arthritis Mother   . Anemia Mother   . Hypertension Mother   . Coronary artery disease Mother     No Known Allergies   Current Medications:   Current Outpatient Prescriptions:  .  albuterol (PROVENTIL HFA;VENTOLIN HFA) 108 (90 BASE) MCG/ACT inhaler, Inhale 2 puffs into the lungs every 6 (six) hours as needed. For wheezing , Disp: , Rfl:  .  allopurinol (ZYLOPRIM) 100 MG tablet, Take 100 mg by mouth 2 (two) times daily., Disp: , Rfl:  .  amLODipine (NORVASC) 10 MG tablet, Take 10 mg by mouth daily., Disp: , Rfl:  .  aspirin EC 81 MG tablet, Take 81 mg by mouth daily., Disp: , Rfl:  .  diltiazem (DILACOR XR) 180 MG 24 hr capsule, Take 180 mg by mouth daily., Disp: , Rfl:  .  furosemide (LASIX) 40 MG tablet, Take 40 mg by mouth daily., Disp: , Rfl:  .  HYDROcodone-acetaminophen (NORCO/VICODIN) 5-325 MG per tablet, Take 1 tablet by mouth every 6 (six) hours as needed., Disp: 10 tablet, Rfl: 0 .  pantoprazole (PROTONIX) 40 MG tablet, Take 40 mg by mouth 2 (two) times daily., Disp: , Rfl:    Review of Systems:   Review of Systems  Constitutional: Negative for chills, fever, malaise/fatigue and weight loss.  HENT: Negative for hearing loss, sinus pain and sore throat.   Eyes: Negative for blurred vision.  Respiratory: Negative for cough and shortness of breath.   Cardiovascular: Negative for chest pain and palpitations.       Ankle swelling  Gastrointestinal: Negative for abdominal pain, constipation, diarrhea, heartburn, nausea and vomiting.  Genitourinary: Negative for dysuria, frequency and urgency.  Musculoskeletal: Negative for back pain, myalgias and neck pain.       Bilateral Knee pain  Skin: Negative for itching and rash.  Neurological: Negative for dizziness, tingling, seizures, loss of consciousness and headaches.  Endo/Heme/Allergies: Negative for polydipsia.  Psychiatric/Behavioral: Negative for depression. The patient is not nervous/anxious.     Vitals:   Vitals:    03/04/17 0949  BP: 130/70  Pulse: 74  Temp: 97.8 F (36.6 C)  TempSrc: Oral  SpO2: 95%  Weight: 139 lb 8 oz (63.3 kg)  Height: 5' 6.5" (1.689 m)     Body mass index is 22.18 kg/m.  Physical Exam:   Physical Exam  Constitutional: He appears well-developed. He is cooperative.  Non-toxic appearance. He does not have a sickly appearance. He does not appear ill. No distress.  Cardiovascular: Normal rate, regular rhythm, S1 normal, S2 normal, normal heart sounds  and normal pulses.   No LE edema  Pulmonary/Chest: Effort normal and breath sounds normal.  Musculoskeletal:  No decreased ROM of bilateral knees, no obvious swelling present. No erythema or warmth -- no evidence of infection. Tenderness to L anterior patella. No calf tenderness or swelling present.   Neurological: He is alert.  Nursing note and vitals reviewed.   Assessment and Plan:    Namish was seen today for establish care and knee pain.  Diagnoses and all orders for this visit:  Chronic pain of both knees This is a chronic issue for patient. No evidence of infection on exam today. He is interested in bilateral knee injections, and I was able to coordinate an appointment with Dr. Teresa Coombs after today's appointment for patient to receive this. He is going to follow-up with Dr. Teresa Coombs for ongoing management of his knees. I advised for him to follow-up with [place] regarding other issues, as well as these issues if Dr. Paulla Fore is unavailable. -     CBC with Differential/Platelet -     Basic metabolic panel  Chronic kidney disease, unspecified CKD stage I have no recent labs on him. I would like to get some labs today I have requested BMP and CBC, while we wait for his lab work and requests from other offices in the meantime. I'm going to have him follow up with [place] for a physical as well as an annual wellness visit. We have requested records from [facility] today. -     CBC with Differential/Platelet -      Basic metabolic panel  Need for prophylactic vaccination against Streptococcus pneumoniae (pneumococcus) -     Pneumococcal conjugate vaccine 13-valent IM    . Reviewed expectations re: course of current medical issues. . Discussed self-management of symptoms. . Outlined signs and symptoms indicating need for more acute intervention. . Patient verbalized understanding and all questions were answered. . See orders for this visit as documented in the electronic medical record. . Patient received an After-Visit Summary.  CMA or LPN served as scribe during this visit. History, Physical, and Plan performed by medical provider. Documentation and orders reviewed and attested to.  Inda Coke, PA-C

## 2017-03-04 ENCOUNTER — Ambulatory Visit (INDEPENDENT_AMBULATORY_CARE_PROVIDER_SITE_OTHER): Payer: Medicare HMO | Admitting: Sports Medicine

## 2017-03-04 ENCOUNTER — Encounter: Payer: Self-pay | Admitting: Physician Assistant

## 2017-03-04 ENCOUNTER — Encounter: Payer: Self-pay | Admitting: Sports Medicine

## 2017-03-04 ENCOUNTER — Ambulatory Visit (INDEPENDENT_AMBULATORY_CARE_PROVIDER_SITE_OTHER): Payer: Medicare HMO | Admitting: Physician Assistant

## 2017-03-04 VITALS — BP 120/78 | HR 72 | Ht 66.5 in | Wt 139.8 lb

## 2017-03-04 VITALS — BP 130/70 | HR 74 | Temp 97.8°F | Ht 66.5 in | Wt 139.5 lb

## 2017-03-04 DIAGNOSIS — M25561 Pain in right knee: Secondary | ICD-10-CM | POA: Diagnosis not present

## 2017-03-04 DIAGNOSIS — G8929 Other chronic pain: Secondary | ICD-10-CM | POA: Diagnosis not present

## 2017-03-04 DIAGNOSIS — M179 Osteoarthritis of knee, unspecified: Secondary | ICD-10-CM | POA: Insufficient documentation

## 2017-03-04 DIAGNOSIS — M25562 Pain in left knee: Principal | ICD-10-CM

## 2017-03-04 DIAGNOSIS — Z23 Encounter for immunization: Secondary | ICD-10-CM | POA: Diagnosis not present

## 2017-03-04 DIAGNOSIS — M170 Bilateral primary osteoarthritis of knee: Secondary | ICD-10-CM

## 2017-03-04 DIAGNOSIS — N189 Chronic kidney disease, unspecified: Secondary | ICD-10-CM | POA: Diagnosis not present

## 2017-03-04 DIAGNOSIS — M1710 Unilateral primary osteoarthritis, unspecified knee: Secondary | ICD-10-CM | POA: Insufficient documentation

## 2017-03-04 LAB — CBC WITH DIFFERENTIAL/PLATELET
Basophils Absolute: 0 10*3/uL (ref 0.0–0.1)
Basophils Relative: 0.7 % (ref 0.0–3.0)
EOS ABS: 0.1 10*3/uL (ref 0.0–0.7)
EOS PCT: 1.5 % (ref 0.0–5.0)
HCT: 34.9 % — ABNORMAL LOW (ref 39.0–52.0)
Hemoglobin: 11.4 g/dL — ABNORMAL LOW (ref 13.0–17.0)
Lymphocytes Relative: 15.6 % (ref 12.0–46.0)
Lymphs Abs: 0.8 10*3/uL (ref 0.7–4.0)
MCHC: 32.7 g/dL (ref 30.0–36.0)
MCV: 96.5 fl (ref 78.0–100.0)
MONO ABS: 0.3 10*3/uL (ref 0.1–1.0)
Monocytes Relative: 5 % (ref 3.0–12.0)
NEUTROS ABS: 4.1 10*3/uL (ref 1.4–7.7)
Neutrophils Relative %: 77.2 % — ABNORMAL HIGH (ref 43.0–77.0)
PLATELETS: 175 10*3/uL (ref 150.0–400.0)
RBC: 3.62 Mil/uL — ABNORMAL LOW (ref 4.22–5.81)
RDW: 16.1 % — ABNORMAL HIGH (ref 11.5–15.5)
WBC: 5.3 10*3/uL (ref 4.0–10.5)

## 2017-03-04 LAB — BASIC METABOLIC PANEL
BUN: 25 mg/dL — AB (ref 6–23)
CALCIUM: 9.8 mg/dL (ref 8.4–10.5)
CO2: 28 mEq/L (ref 19–32)
Chloride: 98 mEq/L (ref 96–112)
Creatinine, Ser: 1.93 mg/dL — ABNORMAL HIGH (ref 0.40–1.50)
GFR: 43.35 mL/min — AB (ref >60.00)
Glucose, Bld: 85 mg/dL (ref 70–99)
POTASSIUM: 3.9 meq/L (ref 3.5–5.1)
Sodium: 137 mEq/L (ref 135–145)

## 2017-03-04 NOTE — Assessment & Plan Note (Signed)
PROCEDURE NOTE: BILATERAL KNEEINJECTION   DESCRIPTION OF PROCEDURE:  The patient's clinical condition is marked by substantial pain and/or significant functional disability. Other conservative therapy has not provided relief, is contraindicated, or not appropriate. There is a reasonable likelihood that injection will significantly improve the patient's pain and/or functional impairment. After discussing the risks, benefits and expected outcomes of the injection and all questions were reviewed and answered, the patient wished to undergo the above named procedure. Verbal consent was obtained. The target structure was injected under direct visualization using sterile technique as below:  RIGHT: PREP: Alcohol, Ethel Chloride APPROACH: Anteriolateral, Single injection, 22g 1.5" needle INJECTATE: 3 cc 1% lidocaine, 1 cc 40mg  DepoMedrol DRESSING: Band-Aid  LEFT:  PREP: Alcohol, Ethel Chloride APPROACH: Anteriolateral, Single injection, 22g 1.5" needle INJECTATE: 3 cc 1% lidocaine, 1 cc 40mg  DepoMedrol DRESSING: Band-Aid  Post procedural instructions including recommending icing and warning signs for infection were reviewed. This procedure was well tolerated and there were no complications.

## 2017-03-04 NOTE — Patient Instructions (Signed)
It was great to meet you.  We will call you with your labwork.   Please schedule a physical with [place] in 2 weeks. You will also need an Annual Wellness Visit with our nurse.

## 2017-03-04 NOTE — Patient Instructions (Signed)

## 2017-03-04 NOTE — Progress Notes (Signed)
OFFICE VISIT NOTE [name]. Micaiah Remillard, [place] [facility] [place] - 81 y.o. male MRN [id number]  Date of birth: 16-Sep-1935  Visit Date: 03/04/2017  PCP: Inda Coke, PA   Referred by: Aretta Nip, MD  Burlene Arnt, CMA acting as scribe for Dr. Paulla Fore.  SUBJECTIVE:   Chief Complaint  Patient presents with  . bilateral knee pain   HPI: As below and per problem based documentation when appropriate.  Pt presents today with complaint of chronic bilateral knee pain, left>right. Xray of the left knee done 03/11/15 showed the following: IMPRESSION: 1. Large joint effusion. 2. Marked medial compartment osteoarthritis. Xray of the right knee done 11/17/11 showed the following: IMPRESSION: 1.  No acute findings. 2.  Severe medial compartment osteoarthritis and small knee joint effusion. 3.  Osteopenia and peripheral vascular calcification.  The pain is described as aching. He feels and hearing popping in the left knee when walking. Pain is rated as 10/10 when walking long distances. He denies pain on the medial or lateral aspect of the knees.   Worsened with walking. He has no increased pain when bending or extending the knees. He says that the left knee is worse than the right because he walks with his cane on the right side.  Improves with rest. Therapies tried include : He has received injections in the knees in the past and gotten some relief. He does not use ice or heat on the knees. He does not use ace wrap or compression sleeve on the knees. He doesn't take medications for the pain in his knees.   Other associated symptoms include: Has has some tightness in both lower legs, seems to be worse when laying down. Pt denies pain in the upper leg, hips, ankles. He does have some occasional numbness in both feet.   Pt denies fever, chills, night sweats, swelling in the knees.     Review of Systems    Constitutional: Negative for chills and fever.  Respiratory: Positive for shortness of breath and wheezing.   Cardiovascular: Negative for chest pain, palpitations and leg swelling.  Musculoskeletal: Positive for joint pain. Negative for falls.  Neurological: Negative for dizziness, tingling and headaches.  Endo/Heme/Allergies: Does not bruise/bleed easily.    Otherwise per HPI.  HISTORY & PERTINENT PRIOR DATA:  03/07/17 BLS - [employer], deductible has been met, insurance will cover 80%, pt has 20% coinsurance (Medicare) [employer] requires PA for [employer] He reports that he quit smoking about 7 years ago. His smoking use included Cigarettes. He has a 10.00 pack-year smoking history. He has never used smokeless tobacco. No results for input(s): HGBA1C, LABURIC in the last 8760 hours. Medications & Allergies reviewed per EMR Patient Active Problem List   Diagnosis Date Noted  . Knee osteoarthritis 03/04/2017  . Abdominal aneurysm without mention of rupture 01/30/2012  . Acute respiratory failure with hypoxia ([place]) 11/28/2011  . Acute pulmonary edema with congestive heart failure ([place]) 11/28/2011  . Acute renal failure ([place]) 11/28/2011  . NSVT (nonsustained ventricular tachycardia) ([place]) 11/22/2011  . Atrial fibrillation with RVR ([place]) 11/17/2011  . Chronic diastolic heart failure ([place]) 06/17/2011  . Hypertension 06/17/2011  . Chronic kidney disease 06/17/2011  . COPD (chronic obstructive pulmonary disease) ([place]) 06/17/2011  . S/P AAA repair 06/17/2011  . DOE (dyspnea on exertion) 06/11/2011  . ANEMIA, SEVERE 03/16/2010  . Severe mitral regurgitation 03/16/2010  . Paroxysmal atrial fibrillation 03/16/2010  . CHF  03/16/2010  . ESOPHAGEAL ULCER, WITH BLEEDING 03/16/2010   Past Medical History:  Diagnosis Date  . Abdominal aortic aneurysm (HCC)    s/p stent graft repair in 6/12  . Alcoholic hepatitis   . Allergy   . Anemia   . Atrial fibrillation (HCC)    no coumadin due to h/o GI  bleeding. 05/2011 stopped  metoprolol, amiodarone, digoxin, diltiazem due to bradycardia   . Blood transfusion without reported diagnosis   . CHF (congestive heart failure) (HCC)    Diastolic Heart failure, Hx of severe MR;  Echocardiogram 05/01/11: Mild LVH, EF 55-60%, mild AI, severe MR, severe LAE, moderate RAE, normal RVSF, moderate-severe TR, PASP 79, severe pulmonary hypertension  . Chronic kidney disease    Stage II  . COPD (chronic obstructive pulmonary disease) (HCC)    emphysema and severe outflow obstruction, on home O2  . Gout   . Hypertension   . Iliac artery aneurysm, bilateral ([place])   . Lung mass    right lower lobe spiculated mass  . Osteoporosis   . Oxygen dependent   . Tibial plateau fracture    non displaced   Family History  Problem Relation Age of Onset  . Emphysema Father   . Leukemia Father   . Heart disease Mother   . Rheum arthritis Mother   . Anemia Mother   . Hypertension Mother   . Coronary artery disease Mother    Past Surgical History:  Procedure Laterality Date  . ABDOMINAL AORTIC ANEURYSM REPAIR    . BLADDER SURGERY    . neck/back surgery     Social History   Occupational History  . retired Retired   Social History Main Topics  . Smoking status: Former Smoker    Packs/day: 0.50    Years: 20.00    Types: Cigarettes    Quit date: 08/26/2009  . Smokeless tobacco: Never Used  . Alcohol use Yes     Comment: rare  . Drug use: No  . Sexual activity: Yes    OBJECTIVE:  VS:  HT:5' 6.5" (168.9 cm)   WT:139 lb 12.8 oz (63.4 kg)  BMI:22.3    BP:120/78  HR:72bpm  TEMP: ( )  RESP:96 % EXAM: Findings:  WDWN, NAD, Non-toxic appearing Alert & appropriately interactive Not depressed or anxious appearing No increased work of breathing. Pupils are equal. EOM intact without nystagmus No clubbing or cyanosis of the extremities appreciated No significant rashes/lesions/ulcerations overlying the examined area. DP & PT pulses 2+/4.  No  significant pretibial edema.  No clubbing or cyanosis Sensation intact to light touch in lower extremities.  Bilateral knee: Generalized osteoarthritic bossing.  No significant deformity..   No significant effusion.   ROM: 3 to 95.   Extensor mechanism intact No significant medial or lateral joint line tenderness.   Stable to varus/valgus strain with 3-4 mm opening with valgus testing bilaterally.. Normal Lachman's.   Negative pain with McMurray's but no palpable click.     No results found. ASSESSMENT & PLAN:     ICD-10-CM   1. Chronic pain of both knees M25.561    M25.562    G89.29   2. Primary osteoarthritis of both knees M17.0   ================================================================= Knee osteoarthritis PROCEDURE NOTE: BILATERAL KNEEINJECTION   DESCRIPTION OF PROCEDURE:  The patient's clinical condition is marked by substantial pain and/or significant functional disability. Other conservative therapy has not provided relief, is contraindicated, or not appropriate. There is a reasonable likelihood that injection will significantly improve the patient's  pain and/or functional impairment. After discussing the risks, benefits and expected outcomes of the injection and all questions were reviewed and answered, the patient wished to undergo the above named procedure. Verbal consent was obtained. The target structure was injected under direct visualization using sterile technique as below:  RIGHT: PREP: Alcohol, Ethel Chloride APPROACH: Anteriolateral, Single injection, 22g 1.5" needle INJECTATE: 3 cc 1% lidocaine, 1 cc 50m DepoMedrol DRESSING: Band-Aid  LEFT:  PREP: Alcohol, Ethel Chloride APPROACH: Anteriolateral, Single injection, 22g 1.5" needle INJECTATE: 3 cc 1% lidocaine, 1 cc 441mDepoMedrol DRESSING: Band-Aid  Post procedural instructions including recommending icing and warning signs for infection were reviewed. This procedure was well tolerated and  there were no complications.     ================================================================= Future Appointments Date Time Provider DeBreese8/14/2018 10:45 AM ShCamelia PhenesDPM [facility]  Follow-up: Return in about 10 weeks (around 05/13/2017).   CMA/ATC served as scEducation administratoruring this visit. History, Physical, and Plan performed by medical provider. Documentation and orders reviewed and attested to.      MiTeresa CoombsDOMadisonports Medicine Physician

## 2017-03-13 NOTE — Progress Notes (Deleted)
Pre visit review using our clinic review tool, if applicable. No additional management support is needed unless otherwise documented below in the visit note. 

## 2017-03-13 NOTE — Progress Notes (Deleted)
Subjective:   [name] is a 81 y.o. male who presents for an Initial Medicare Annual Wellness Visit.  Review of Systems  No ROS.  Medicare Wellness Visit. Additional risk factors are reflected in the social history.    Sleep patterns: Home Safety/Smoke Alarms: Feels safe in home. Smoke alarms in place.  Living environment; residence and Firearm Safety: [place] Safety/Bike Helmet: Wears seat belt.   Counseling:   Dental-  Male:   CCS-    No record. PSA-  Lab Results  Component Value Date   PSA 8.11 (H) 05/04/2011      Objective:    There were no vitals filed for this visit. There is no height or weight on file to calculate BMI.  Current Medications (verified) Outpatient Encounter Prescriptions as of 03/18/2017  Medication Sig  . albuterol (PROVENTIL HFA;VENTOLIN HFA) 108 (90 BASE) MCG/ACT inhaler Inhale 2 puffs into the lungs every 6 (six) hours as needed. For wheezing   . allopurinol (ZYLOPRIM) 100 MG tablet Take 100 mg by mouth 2 (two) times daily.  Marland Kitchen amLODipine (NORVASC) 10 MG tablet Take 10 mg by mouth daily.  Marland Kitchen aspirin EC 81 MG tablet Take 81 mg by mouth daily.  Marland Kitchen diltiazem (DILACOR XR) 180 MG 24 hr capsule Take 180 mg by mouth daily.  . furosemide (LASIX) 40 MG tablet Take 40 mg by mouth daily.  Marland Kitchen HYDROcodone-acetaminophen (NORCO/VICODIN) 5-325 MG per tablet Take 1 tablet by mouth every 6 (six) hours as needed.  . pantoprazole (PROTONIX) 40 MG tablet Take 40 mg by mouth 2 (two) times daily.   No facility-administered encounter medications on file as of 03/18/2017.     Allergies (verified) Patient has no known allergies.   History: Past Medical History:  Diagnosis Date  . Abdominal aortic aneurysm (HCC)    s/p stent graft repair in 6/12  . Alcoholic hepatitis   . Allergy   . Anemia   . Atrial fibrillation (HCC)    no coumadin due to h/o GI bleeding. 05/2011 stopped  metoprolol, amiodarone, digoxin, diltiazem due to bradycardia   . Blood  transfusion without reported diagnosis   . CHF (congestive heart failure) (HCC)    Diastolic Heart failure, Hx of severe MR;  Echocardiogram 05/01/11: Mild LVH, EF 55-60%, mild AI, severe MR, severe LAE, moderate RAE, normal RVSF, moderate-severe TR, PASP 79, severe pulmonary hypertension  . Chronic kidney disease    Stage II  . COPD (chronic obstructive pulmonary disease) (HCC)    emphysema and severe outflow obstruction, on home O2  . Gout   . Hypertension   . Iliac artery aneurysm, bilateral ([place])   . Lung mass    right lower lobe spiculated mass  . Osteoporosis   . Oxygen dependent   . Tibial plateau fracture    non displaced   Past Surgical History:  Procedure Laterality Date  . ABDOMINAL AORTIC ANEURYSM REPAIR    . BLADDER SURGERY    . neck/back surgery     Family History  Problem Relation Age of Onset  . Emphysema Father   . Leukemia Father   . Heart disease Mother   . Rheum arthritis Mother   . Anemia Mother   . Hypertension Mother   . Coronary artery disease Mother    Social History   Occupational History  . retired Retired   Social History Main Topics  . Smoking status: Former Smoker    Packs/day: 0.50    Years: 20.00  Types: Cigarettes    Quit date: 08/26/2009  . Smokeless tobacco: Never Used  . Alcohol use Yes     Comment: rare  . Drug use: No  . Sexual activity: Yes   Tobacco Counseling Counseling given: Not Answered   Activities of Daily Living No flowsheet data found.  Immunizations and Health Maintenance Immunization History  Administered Date(s) Administered  . Influenza Split 05/27/2011  . Pneumococcal Conjugate-13 03/04/2017  . Pneumococcal Polysaccharide-23 11/27/2011   Health Maintenance Due  Topic Date Due  . Samul Dada  09/09/1954    Patient Care Team: Inda Coke, [place] as PCP - General (Physician Assistant) Gwenette Greet, Armando Reichert, MD (Pulmonary Disease) Verl Blalock, Marijo Conception, MD (Inactive) (Cardiology) Sharmon Revere as  Physician Assistant (Physician Assistant)  Indicate any recent Medical Services you may have received from other than [facility] providers in the past year (date may be approximate).    Assessment:   This is a routine wellness examination for [name]. Physical assessment deferred to PCP.  Hearing/Vision screen No exam data present  Dietary issues and exercise activities discussed:   Diet (meal preparation, eat out, water intake, caffeinated beverages, dairy products, fruits and vegetables):  Breakfast: Lunch:  Dinner:      Goals    None     Depression Screen PHQ 2/9 Scores 03/04/2017  PHQ - 2 Score 0    Fall Risk Fall Risk  03/04/2017  Falls in the past year? No    Cognitive Function:        Screening Tests Health Maintenance  Topic Date Due  . TETANUS/TDAP  09/09/1954  . INFLUENZA VACCINE  03/26/2017  . PNA vac Low Risk Adult  Completed        Plan:   ***  I have personally reviewed and noted the following in the patient's chart:   . Medical and social history . Use of alcohol, tobacco or illicit drugs  . Current medications and supplements . Functional ability and status . Nutritional status . Physical activity . Advanced directives . List of other physicians . Vitals . Screenings to include cognitive, depression, and falls . Referrals and appointments  In addition, I have reviewed and discussed with patient certain preventive protocols, quality metrics, and best practice recommendations. A written personalized care plan for preventive services as well as general preventive health recommendations were provided to patient.     Ree Edman, RN   03/13/2017

## 2017-03-17 NOTE — Progress Notes (Signed)
I acted as a Education administrator for [employer], PA-C Anselmo Pickler, LPN  Subjective:    [name] is a 81 y.o. male and is here for a comprehensive physical exam.  HPI  There are no preventive care reminders to display for this patient.  Acute Concerns: None  Chronic Issues: Severe COPD -- last evaluated by Dr. Danton Sewer in Dec 2013 -- was told to take Spiriva and Symbicort daily. He was told to follow-up in 6 months but he didn't and he cannot tell me why he did not follow-up. He is taking Albuterol twice a day. He tells me that he wears 5 liters of oxygen at night, but he tells our wellness coach that he wears 2 liters. Uses [facility] for his oxygen. He used to be a smoker but quit in 2011. No recent exacerbations. Hypertension -- BP is well maintained on Norvasc, Diltiazem and Lasix. Denies chest pain, lower leg swelling, blurred vision. Does have SOB at baseline. Afib with RVR -- in 2013 he was hospitalized with Afib with RVR. He is not on coumadin due to hx of GIB. CT angio was negative for PE but did show aneurismal dilation of the ascending aorta. Has not had routine cardiology follow-up. Currently on 81 mg ASA daily. CHF -- hx of diastolic HF/severe MR/severe TR, EF 55-60% and pulmonary HTN. Has not had routine cardiology follow-up. Chronic kidney disease -- most recent renal U/S available to me was conducted on Sep 2012 -- showed enlargement of the prostate gland and bilateral renal cysts. No hydronephrosis. It's unclear what his baseline is, but the most recent BMP I have on 03/04/17 shows BUN 25, Creatinine 1.93 and GFR 43. CT on 01/30/2012 shows bilateral renal artery stenosis. Unintentional weight loss - weight is currently at its lowest at 135 lb. Baseline weight appears to be around 145-150 lb and was last this around Dec 2013. He has someone drive him to the grocery store and tries to eat high calorie/high protein food. Appears cachectic. Does have a history of lung  mass. Ostearthritis of bilateral knees -- bilateral knee injections on 03/04/17 by Dr. Paulla Fore. Dystrophic nails -- bilateral toenails thickened and likely with fungus  Health Maintenance: Immunizations -- up to date Colonoscopy -- requesting records Exercise -- none, uses cane at baseline, occasional walking Weight -- Weight: 135 lb (61.2 kg)  --- 200 lb, weights 135-140 lb Mood -- none  Depression screen PHQ 2/9 03/18/2017  Decreased Interest 0  Down, Depressed, Hopeless 0  PHQ - 2 Score 0   Other providers/specialists: Dr. Teresa Coombs -- sports medicine  PMHx, SurgHx, SocialHx, Medications, and Allergies were reviewed in the Visit Navigator and updated as appropriate.   Past Medical History:  Diagnosis Date  . Abdominal aortic aneurysm (HCC)    s/p stent graft repair in 6/12  . Alcoholic hepatitis   . Allergy   . Anemia   . Atrial fibrillation (HCC)    no coumadin due to h/o GI bleeding. 05/2011 stopped  metoprolol, amiodarone, digoxin, diltiazem due to bradycardia   . Blood transfusion without reported diagnosis   . CHF (congestive heart failure) (HCC)    Diastolic Heart failure, Hx of severe MR;  Echocardiogram 05/01/11: Mild LVH, EF 55-60%, mild AI, severe MR, severe LAE, moderate RAE, normal RVSF, moderate-severe TR, PASP 79, severe pulmonary hypertension  . Chronic kidney disease    Stage II  . COPD (chronic obstructive pulmonary disease) (HCC)    emphysema and severe outflow  obstruction, on home O2  . Gout   . Hypertension   . Iliac artery aneurysm, bilateral ([place])   . Lung mass    right lower lobe spiculated mass  . Osteoporosis   . Oxygen dependent   . Tibial plateau fracture    non displaced     Past Surgical History:  Procedure Laterality Date  . ABDOMINAL AORTIC ANEURYSM REPAIR    . BLADDER SURGERY    . neck/back surgery       Family History  Problem Relation Age of Onset  . Emphysema Father   . Leukemia Father   . Heart disease Mother   . Rheum  arthritis Mother   . Anemia Mother   . Hypertension Mother   . Coronary artery disease Mother     Social History  Substance Use Topics  . Smoking status: Former Smoker    Packs/day: 0.50    Years: 20.00    Types: Cigarettes    Quit date: 08/26/2009  . Smokeless tobacco: Never Used  . Alcohol use Yes     Comment: rare    Review of Systems:   Review of Systems  Constitutional: Negative for chills, fever, malaise/fatigue and weight loss.  HENT: Negative for hearing loss, sinus pain and sore throat.   Eyes: Negative for blurred vision.  Respiratory: Negative for cough and shortness of breath.   Cardiovascular: Negative for chest pain, palpitations and leg swelling.  Gastrointestinal: Negative for abdominal pain, constipation, diarrhea, heartburn, nausea and vomiting.  Genitourinary: Negative for dysuria, frequency and urgency.  Musculoskeletal: Negative for back pain, myalgias and neck pain.  Skin: Negative for itching and rash.  Neurological: Negative for dizziness, tingling, seizures, loss of consciousness and headaches.  Endo/Heme/Allergies: Negative for polydipsia.  Psychiatric/Behavioral: Negative for depression. The patient is not nervous/anxious.     Objective:    Vitals:   03/18/17 1022  BP: 138/80  Pulse: 74  Temp: 97.7 F (36.5 C)   Body mass index is 21.46 kg/m.  General  Alert, cooperative, no distress, appears stated age, appears cachectic  Head:  Normocephalic, without obvious abnormality, atraumatic  Eyes:  PERRL, conjunctiva/corneas clear, EOM's intact, fundi benign, both eyes       Ears:  Normal TM's and external ear canals, both ears  Nose: Nares normal, septum midline, mucosa normal, no drainage or sinus tenderness  Throat: Lips, mucosa, and tongue normal; poor dentition  Neck: Supple, symmetrical, trachea midline, no adenopathy;     thyroid:  No enlargement/tenderness/nodules; no carotid bruit or JVD  Back:   Symmetric, no curvature, ROM normal,  no CVA tenderness  Lungs:   Clear to auscultation bilaterally, respirations unlabored  Chest wall:  No tenderness or deformity  Heart:  Irregularly irregular rate and rhythm, S1 and S2 normal, no murmur, rub or gallop  Abdomen:   Soft, non-tender, bowel sounds active all four quadrants, no masses, no organomegaly  Extremities: Extremities normal, atraumatic, no cyanosis or edema, toenails on bilateral feet thickened and overgrown  Prostate : Deferred  Skin: Skin color, texture, turgor normal, no rashes or lesions  Lymph nodes: Cervical, supraclavicular, and axillary nodes normal  Neurologic: CNII-XII grossly intact. Normal strength, sensation and reflexes throughout    AssessmentPlan:   Problem List Items Addressed This Visit      Cardiovascular and Mediastinum   Chronic diastolic heart failure ([place])    Very complex medical history. Severe SOB at baseline. Most recent echo with EF 55-60% (unknown date). Takes lasix as prescribed. I  would like him evaluated by cardiology to weigh in on his medication regimen.      Relevant Orders   Lipid panel (Completed)   Ambulatory referral to Cardiology   Hypertension    Well controlled presently. Will have cardiology weigh in on medication regimen.      Relevant Orders   Ambulatory referral to Cardiology   Atrial fibrillation with RVR ([place])    Has not been on anticoagulation other than ASA 81 mg 2/2 hx of GIB. CHADS2VASC is 5. Will have him seen by cardiology for further evaluation and treatment.      Relevant Orders   Lipid panel (Completed)   Ambulatory referral to Cardiology     Respiratory   COPD (chronic obstructive pulmonary disease) ([place])    Has not seen pulmonary in approx 4.5 years. Wears O2 at night. Has been using Albuterol BID x at least 6 months. Will change to Symbicort and have him follow back up with pulmonary. Will alert [place], who manages his oxygen, that he is now our patient.      Relevant Medications    budesonide-formoterol (SYMBICORT) 80-4.5 MCG/ACT inhaler   Other Relevant Orders   Ambulatory referral to Pulmonology     Musculoskeletal and Integument   Knee osteoarthritis    Managed by Dr. Paulla Fore. Bilateral knee injections on 03/04/17. Follow-up in 3 months if needed.        Genitourinary   Chronic kidney disease    BMP from two weeks ago shows shows BUN 25, Creatinine 1.93 and GFR 43. Will continue to follow. Avoid nephrotoxic agents. Consider nephrology referral.       Other Visit Diagnoses    Routine general medical examination at a health care facility    -  Primary Today patient counseled on age appropriate routine health concerns for screening and prevention, each reviewed and up to date or declined. Immunizations reviewed and up to date or declined. Labs ordered and reviewed. Risk factors for depression reviewed and negative. Hearing function and visual acuity are intact. ADLs screened and addressed as needed. Functional ability and level of safety reviewed and appropriate. Education, counseling and referrals performed based on assessed risks today. Patient provided with a copy of personalized plan for preventive services.   Encounter for lipid screening for cardiovascular disease       Relevant Orders   Lipid panel (Completed)   Unintentional weight loss     Discussed high calorie, high protein diet. Follow-up with [place] in 3 months. Provided him with Ensure coupons, recommend supplementation of meals to help with weight gain.   Dystrophic nail     Refer to podiatry.    Relevant Orders   Ambulatory referral to Podiatry       Well Adult Exam: Labs ordered: Yes. Patient counseling was done. See below for items discussed. Discussed the patient's BMI.  The BMI BMI is not in the acceptable range; BMI management plan is completed Follow up in 3 months.   Patient Counseling: [x]   Nutrition: Stressed importance of moderation in sodium/caffeine intake, saturated fat and cholesterol,  caloric balance, sufficient intake of fresh fruits, vegetables, and fiber  [x]   Stressed the importance of regular exercise.   []   Substance Abuse: Discussed cessation/primary prevention of tobacco, alcohol, or other drug use; driving or other dangerous activities under the influence; availability of treatment for abuse.   [x]   Injury prevention: Discussed safety belts, safety helmets, smoke detector, smoking near bedding or upholstery.   []   Sexuality: Discussed sexually transmitted diseases, partner selection, use of condoms, avoidance of unintended pregnancy  and contraceptive alternatives.   [x]   Dental health: Discussed importance of regular tooth brushing, flossing, and dental visits.  [x]   Health maintenance and immunizations reviewed. Please refer to Health maintenance section.   CMA or LPN served as scribe during this visit. History, Physical, and Plan performed by medical provider. Documentation and orders reviewed and attested to.  Inda Coke, PA-C [place]

## 2017-03-18 ENCOUNTER — Encounter: Payer: Self-pay | Admitting: Physician Assistant

## 2017-03-18 ENCOUNTER — Telehealth: Payer: Self-pay | Admitting: Physician Assistant

## 2017-03-18 ENCOUNTER — Ambulatory Visit: Payer: Medicare HMO | Admitting: *Deleted

## 2017-03-18 ENCOUNTER — Ambulatory Visit (INDEPENDENT_AMBULATORY_CARE_PROVIDER_SITE_OTHER): Payer: Medicare HMO | Admitting: Physician Assistant

## 2017-03-18 VITALS — BP 138/80 | HR 74 | Temp 97.7°F | Ht 66.5 in | Wt 135.0 lb

## 2017-03-18 DIAGNOSIS — M170 Bilateral primary osteoarthritis of knee: Secondary | ICD-10-CM | POA: Diagnosis not present

## 2017-03-18 DIAGNOSIS — Z13220 Encounter for screening for lipoid disorders: Secondary | ICD-10-CM | POA: Diagnosis not present

## 2017-03-18 DIAGNOSIS — Z0000 Encounter for general adult medical examination without abnormal findings: Secondary | ICD-10-CM

## 2017-03-18 DIAGNOSIS — J449 Chronic obstructive pulmonary disease, unspecified: Secondary | ICD-10-CM | POA: Diagnosis not present

## 2017-03-18 DIAGNOSIS — Z136 Encounter for screening for cardiovascular disorders: Secondary | ICD-10-CM | POA: Diagnosis not present

## 2017-03-18 DIAGNOSIS — I10 Essential (primary) hypertension: Secondary | ICD-10-CM

## 2017-03-18 DIAGNOSIS — R634 Abnormal weight loss: Secondary | ICD-10-CM | POA: Diagnosis not present

## 2017-03-18 DIAGNOSIS — I4891 Unspecified atrial fibrillation: Secondary | ICD-10-CM | POA: Diagnosis not present

## 2017-03-18 DIAGNOSIS — I5032 Chronic diastolic (congestive) heart failure: Secondary | ICD-10-CM | POA: Diagnosis not present

## 2017-03-18 DIAGNOSIS — L603 Nail dystrophy: Secondary | ICD-10-CM

## 2017-03-18 DIAGNOSIS — N189 Chronic kidney disease, unspecified: Secondary | ICD-10-CM | POA: Diagnosis not present

## 2017-03-18 LAB — LIPID PANEL
CHOLESTEROL: 164 mg/dL (ref 0–200)
HDL: 75.4 mg/dL (ref >39.00)
LDL Cholesterol: 80 mg/dL (ref 0–99)
NonHDL: 89.05
TRIGLYCERIDES: 44 mg/dL (ref 0.0–149.0)
Total CHOL/HDL Ratio: 2
VLDL: 8.8 mg/dL (ref 0.0–40.0)

## 2017-03-18 MED ORDER — BUDESONIDE-FORMOTEROL FUMARATE 80-4.5 MCG/ACT IN AERO: 2.0000 | INHALATION_SPRAY | Freq: Two times a day (BID) | RESPIRATORY_TRACT | 3 refills | Status: AC

## 2017-03-18 NOTE — Assessment & Plan Note (Signed)
Managed by Dr. Paulla Fore. Bilateral knee injections on 03/04/17. Follow-up in 3 months if needed.

## 2017-03-18 NOTE — Telephone Encounter (Signed)
Please call [facility] and notify them that patient is now under our care.  Please find out what his current oxygen use is. Is he eligible for home health RN or PT?  Inda Coke PA-C 03/18/17

## 2017-03-18 NOTE — Assessment & Plan Note (Signed)
Well controlled presently. Will have cardiology weigh in on medication regimen.

## 2017-03-18 NOTE — Assessment & Plan Note (Signed)
BMP from two weeks ago shows shows BUN 25, Creatinine 1.93 and GFR 43. Will continue to follow. Avoid nephrotoxic agents. Consider nephrology referral.

## 2017-03-18 NOTE — Assessment & Plan Note (Signed)
Very complex medical history. Severe SOB at baseline. Most recent echo with EF 55-60% (unknown date). Takes lasix as prescribed. I would like him evaluated by cardiology to weigh in on his medication regimen.

## 2017-03-18 NOTE — Assessment & Plan Note (Signed)
Has not seen pulmonary in approx 4.5 years. Wears O2 at night. Has been using Albuterol BID x at least 6 months. Will change to Symbicort and have him follow back up with pulmonary. Will alert [place], who manages his oxygen, that he is now our patient.

## 2017-03-18 NOTE — Progress Notes (Addendum)
Subjective:   BRAIN HONEYCUTT is a 81 y.o. male who presents for an Initial Medicare Annual Wellness Visit.  Review of Systems  No ROS.  Medicare Wellness Visit. Additional risk factors are reflected in the social history.  Cardiac Risk Factors include: advanced age (>49men, >26 women);dyslipidemia;hypertension   Sleep patterns:  9 hrs/night. Wakes up feeling rested. 2L [place] at night. Home Safety/Smoke Alarms: Feels safe in home. Smoke alarms in place.  Living environment; residence and Firearm Safety: Lives alone in one story house. Seat Belt Safety/Bike Helmet: Wears seat belt.   Counseling:   Dental- Does not go, no insurance. Dental resources given.  Male:   CCS- Waiting for records.     PSA-  Lab Results  Component Value Date   PSA 8.11 (H) 05/04/2011      Objective:    Today's Vitals   03/18/17 1022  BP: 138/80  Pulse: 74  Temp: 97.7 F (36.5 C)  TempSrc: Oral  SpO2: 97%  Weight: 135 lb (61.2 kg)  Height: 5' 6.5" (1.689 m)   Body mass index is 21.46 kg/m.  Current Medications (verified) Outpatient Encounter Prescriptions as of 03/18/2017  Medication Sig  . albuterol (PROVENTIL HFA;VENTOLIN HFA) 108 (90 BASE) MCG/ACT inhaler Inhale 2 puffs into the lungs every 6 (six) hours as needed. For wheezing   . allopurinol (ZYLOPRIM) 100 MG tablet Take 100 mg by mouth 2 (two) times daily.  Marland Kitchen amLODipine (NORVASC) 10 MG tablet Take 10 mg by mouth daily.  Marland Kitchen aspirin EC 81 MG tablet Take 81 mg by mouth daily.  Marland Kitchen diltiazem (DILACOR XR) 180 MG 24 hr capsule Take 180 mg by mouth daily.  . furosemide (LASIX) 40 MG tablet Take 40 mg by mouth daily.  Marland Kitchen HYDROcodone-acetaminophen (NORCO/VICODIN) 5-325 MG per tablet Take 1 tablet by mouth every 6 (six) hours as needed.  . pantoprazole (PROTONIX) 40 MG tablet Take 40 mg by mouth 2 (two) times daily.  . budesonide-formoterol (SYMBICORT) 80-4.5 MCG/ACT inhaler Inhale 2 puffs into the lungs 2 (two) times daily.   No  facility-administered encounter medications on file as of 03/18/2017.     Allergies (verified) Patient has no known allergies.   History: Past Medical History:  Diagnosis Date  . Abdominal aortic aneurysm (HCC)    s/p stent graft repair in 6/12  . Alcoholic hepatitis   . Allergy   . Anemia   . Atrial fibrillation (HCC)    no coumadin due to h/o GI bleeding. 05/2011 stopped  metoprolol, amiodarone, digoxin, diltiazem due to bradycardia   . Blood transfusion without reported diagnosis   . CHF (congestive heart failure) (HCC)    Diastolic Heart failure, Hx of severe MR;  Echocardiogram 05/01/11: Mild LVH, EF 55-60%, mild AI, severe MR, severe LAE, moderate RAE, normal RVSF, moderate-severe TR, PASP 79, severe pulmonary hypertension  . Chronic kidney disease    Stage II  . COPD (chronic obstructive pulmonary disease) (HCC)    emphysema and severe outflow obstruction, on home O2  . Gout   . Hypertension   . Iliac artery aneurysm, bilateral ([place])   . Lung mass    right lower lobe spiculated mass  . Osteoporosis   . Oxygen dependent   . Tibial plateau fracture    non displaced   Past Surgical History:  Procedure Laterality Date  . ABDOMINAL AORTIC ANEURYSM REPAIR    . BLADDER SURGERY    . neck/back surgery     Family History  Problem Relation Age  of Onset  . Emphysema Father   . Leukemia Father   . Heart disease Mother   . Rheum arthritis Mother   . Anemia Mother   . Hypertension Mother   . Coronary artery disease Mother    Social History   Occupational History  . retired Retired   Social History Main Topics  . Smoking status: Former Smoker    Packs/day: 0.50    Years: 20.00    Types: Cigarettes    Quit date: 08/26/2009  . Smokeless tobacco: Never Used  . Alcohol use Yes     Comment: rare  . Drug use: No  . Sexual activity: Yes   Tobacco Counseling Counseling given: Not Answered   Activities of Daily Living In your present state of health, do you have any  difficulty performing the following activities: 03/18/2017  Hearing? N  Vision? N  Difficulty concentrating or making decisions? N  Walking or climbing stairs? N  Dressing or bathing? N  Doing errands, shopping? N  Preparing Food and eating ? N  Using the Toilet? N  In the past six months, have you accidently leaked urine? N  Do you have problems with loss of bowel control? N  Managing your Medications? N  Managing your Finances? N  Housekeeping or managing your Housekeeping? N  Some recent data might be hidden    Immunizations and Health Maintenance Immunization History  Administered Date(s) Administered  . Influenza Split 05/27/2011  . Pneumococcal Conjugate-13 03/04/2017  . Pneumococcal Polysaccharide-23 11/27/2011   There are no preventive care reminders to display for this patient.  Patient Care Team: Inda Coke, [place] as PCP - General (Physician Assistant) Gwenette Greet, Armando Reichert, MD (Pulmonary Disease) Verl Blalock, Marijo Conception, MD (Inactive) (Cardiology) Sharmon Revere as Physician Assistant (Physician Assistant)  Indicate any recent Medical Services you may have received from other than [facility] providers in the past year (date may be approximate).    Assessment:   This is a routine wellness examination for [place]. complete physical examination  Hearing/Vision screen Hearing Screening Comments: Able to hear conversational tones w/o difficulty. No issues reported.   Vision Screening Comments: Pt states it has been a long time.  Dietary issues and exercise activities discussed: Current Exercise Habits: The patient does not participate in regular exercise at present, Exercise limited by: respiratory conditions(s)   Diet (meal preparation, eat out, water intake, caffeinated beverages, dairy products, fruits and vegetables): Snacks on chips, peanut butter crackers. 2-3 glasses water/day.  1 cup coffee. 1 can soda/day.  Breakfast: Sausage, eggs, grits, toast or cereal or  oatmeal Lunch: Hamburger or sandwich, beans and vegetables. Dinner: Lawyer, vegetables.    Reminded pt to pick up Ensure drinks as prescribed by PCP.  Goals    None     Depression Screen PHQ 2/9 Scores 03/18/2017 03/18/2017 03/04/2017  PHQ - 2 Score 0 0 0    Fall Risk Fall Risk  03/18/2017 03/18/2017 03/04/2017  Falls in the past year? No No No    Cognitive Function: MMSE - Mini Mental State Exam 03/18/2017  Orientation to time 4  Orientation to Place 5  Registration 3  Attention/ Calculation 0  Language- name 2 objects 2  Language- repeat 1  Language- follow 3 step command 3  Language- read & follow direction 1  Write a sentence 0  Copy design 1        Screening Tests Health Maintenance  Topic Date Due  . TETANUS/TDAP  03/18/2018 (Originally 09/09/1954)  .  INFLUENZA VACCINE  03/26/2017  . PNA vac Low Risk Adult  Completed        Plan:   Follow up with PCP as directed.  Advance Directive form given to pt and instructed to bring back for copying once completed.  I have personally reviewed and noted the following in the patient's chart:   . Medical and social history . Use of alcohol, tobacco or illicit drugs  . Current medications and supplements . Functional ability and status . Nutritional status . Physical activity . Advanced directives . List of other physicians . Hospitalizations, surgeries, and ER visits in previous 12 months . Vitals . Screenings to include cognitive, depression, and falls . Referrals and appointments  In addition, I have reviewed and discussed with patient certain preventive protocols, quality metrics, and best practice recommendations. A written personalized care plan for preventive services as well as general preventive health recommendations were provided to patient.     Ree Edman, RN   03/18/2017    I have personally reviewed the Medicare Annual Wellness questionnaire and have noted 1. The patient's medical and  social history 2. Their use of alcohol, tobacco or illicit drugs 3. Their current medications and supplements 4. The patient's functional ability including ADL's, fall risks, home safety risks and hearing or visual impairment. 5. Diet and physical activities 6. Evidence for depression or mood disorders 7. Reviewed Updated provider list, see scanned forms and [facility] Snapshot.   The patients weight, height, BMI and visual acuity have been recorded in the chart I have made referrals, counseling and provided education to the patient based review of the above and I have provided the pt with a written personalized care plan for preventive services.  I have provided the patient with a copy of your personalized plan for preventive services. Instructed to take the time to review along with their updated medication list.  Inda Coke PA-C 03/18/17

## 2017-03-18 NOTE — Assessment & Plan Note (Signed)
Has not been on anticoagulation other than ASA 81 mg 2/2 hx of GIB. CHADS2VASC is 5. Will have him seen by cardiology for further evaluation and treatment.

## 2017-03-18 NOTE — Patient Instructions (Signed)
Start the Symbicort inhaler twice day. Use the albuterol inhaler in between, if you get short of breath.  Please go to the cardiology and pulmonology appointment -- you will be contacted about these appointments.  I will also send you to podiatry to have your toenails evaluated.  I would like for you to drink TWO ENSURES daily, these should not replace your meals but be in addition to your meals.  I want to see you in 3 months to check in on how things are going.

## 2017-03-21 NOTE — Telephone Encounter (Signed)
[place] and spoke to [facility] and asked her if pt is getting home health care from nursing? [name] said pt has not been in [facility] since 2013. Then I spoke to [facility] in oxygne department and asked her if pt is getting oxygen from them? [name] said yes, he has been getting oxygen since 2014. Asked her if he has been getting new tank? [name] said he has a concentrator which makes oxygen and has not had any new supplies since 2014. No one has been going out to him. Asked her who gave orders? She could not tell me, but said he would need new orders in order to get new supplies. Told her okay.

## 2017-03-21 NOTE — Telephone Encounter (Signed)
[name], please see message regarding pt.

## 2017-03-24 DIAGNOSIS — J449 Chronic obstructive pulmonary disease, unspecified: Secondary | ICD-10-CM | POA: Diagnosis not present

## 2017-03-25 NOTE — Telephone Encounter (Signed)
Tried to contact pt, no answer unable to leave message no voicemail will try again later.

## 2017-03-25 NOTE — Telephone Encounter (Signed)
I would like for pulmonology to weigh in on this decision.  It appears that they called and left him a voicemail to set up an appointment. Can you call the patient and see why he has not called them back?  Inda Coke PA-C 03/25/17

## 2017-03-26 ENCOUNTER — Encounter: Payer: Self-pay | Admitting: Physician Assistant

## 2017-03-26 LAB — B-NATRIURETIC PEPTIDE (CONVERTED LAB): BRAIN NATRIURETIC PEPTIDE: 581

## 2017-03-26 NOTE — Telephone Encounter (Signed)
Patient returning phone call. Call patient back, okay to leave a detailed message on phone.

## 2017-03-27 NOTE — Telephone Encounter (Signed)
Spoke to pt, told him [facility] would like him to see Pulmonary lung doctor and they called you and left a message for you to schedule an appt. Pt verbalized understanding and said he did not get a call. Told pt I will give you there number so you can schedule an appt, number given to pt. Pt verbalized understanding and said he will call to schedule an appt. Pt also said kidney doctor contacted him and asked if he still needs to see them. Told him yes you still need to see Kidney doctor please make sure you call them back and schedule appt. Pt verbalized understanding.

## 2017-03-28 ENCOUNTER — Telehealth: Payer: Self-pay | Admitting: Physician Assistant

## 2017-03-28 NOTE — Telephone Encounter (Signed)
Keith, please see message. 

## 2017-03-28 NOTE — Telephone Encounter (Signed)
Patient wants referral to podiatry closer to [facility].  Please advise.  Thank you,  -LL

## 2017-04-08 ENCOUNTER — Ambulatory Visit: Payer: Self-pay | Admitting: Podiatry

## 2017-04-10 ENCOUNTER — Telehealth: Payer: Self-pay | Admitting: Physician Assistant

## 2017-04-10 NOTE — Telephone Encounter (Signed)
Please see patients request to have the referral at a closer location.

## 2017-04-10 NOTE — Telephone Encounter (Signed)
Patient's wife called in reference to seeing if patient could get a referral to a foot doctor closer to where they live in [place]. Please call patient and advise. OK to leave message. Can also contact patient's wife at [phone].

## 2017-04-17 NOTE — Telephone Encounter (Signed)
Referral was sent to closer Podiatrist for pt by Lanny Hurst.

## 2017-04-24 DIAGNOSIS — J449 Chronic obstructive pulmonary disease, unspecified: Secondary | ICD-10-CM | POA: Diagnosis not present

## 2017-04-28 ENCOUNTER — Inpatient Hospital Stay (HOSPITAL_COMMUNITY): Payer: Medicare HMO

## 2017-04-28 ENCOUNTER — Inpatient Hospital Stay (HOSPITAL_COMMUNITY)
Admission: EM | Admit: 2017-04-28 | Discharge: 2017-05-26 | DRG: 291 | Disposition: E | Payer: Medicare HMO | Attending: Cardiology | Admitting: Cardiology

## 2017-04-28 ENCOUNTER — Encounter (HOSPITAL_COMMUNITY): Payer: Self-pay | Admitting: Nurse Practitioner

## 2017-04-28 ENCOUNTER — Emergency Department (HOSPITAL_COMMUNITY): Payer: Medicare HMO

## 2017-04-28 ENCOUNTER — Other Ambulatory Visit (HOSPITAL_COMMUNITY): Payer: Medicare HMO

## 2017-04-28 DIAGNOSIS — R0602 Shortness of breath: Secondary | ICD-10-CM

## 2017-04-28 DIAGNOSIS — Z66 Do not resuscitate: Secondary | ICD-10-CM | POA: Diagnosis present

## 2017-04-28 DIAGNOSIS — G9341 Metabolic encephalopathy: Secondary | ICD-10-CM | POA: Diagnosis not present

## 2017-04-28 DIAGNOSIS — I517 Cardiomegaly: Secondary | ICD-10-CM | POA: Diagnosis not present

## 2017-04-28 DIAGNOSIS — N183 Chronic kidney disease, stage 3 (moderate): Secondary | ICD-10-CM | POA: Diagnosis present

## 2017-04-28 DIAGNOSIS — R600 Localized edema: Secondary | ICD-10-CM | POA: Diagnosis not present

## 2017-04-28 DIAGNOSIS — R570 Cardiogenic shock: Secondary | ICD-10-CM | POA: Diagnosis not present

## 2017-04-28 DIAGNOSIS — R627 Adult failure to thrive: Secondary | ICD-10-CM | POA: Diagnosis present

## 2017-04-28 DIAGNOSIS — I482 Chronic atrial fibrillation: Secondary | ICD-10-CM

## 2017-04-28 DIAGNOSIS — R001 Bradycardia, unspecified: Secondary | ICD-10-CM | POA: Diagnosis not present

## 2017-04-28 DIAGNOSIS — Z87891 Personal history of nicotine dependence: Secondary | ICD-10-CM

## 2017-04-28 DIAGNOSIS — J449 Chronic obstructive pulmonary disease, unspecified: Secondary | ICD-10-CM | POA: Diagnosis present

## 2017-04-28 DIAGNOSIS — Z0189 Encounter for other specified special examinations: Secondary | ICD-10-CM

## 2017-04-28 DIAGNOSIS — Z7951 Long term (current) use of inhaled steroids: Secondary | ICD-10-CM

## 2017-04-28 DIAGNOSIS — N179 Acute kidney failure, unspecified: Secondary | ICD-10-CM | POA: Diagnosis present

## 2017-04-28 DIAGNOSIS — Z9981 Dependence on supplemental oxygen: Secondary | ICD-10-CM | POA: Diagnosis not present

## 2017-04-28 DIAGNOSIS — R05 Cough: Secondary | ICD-10-CM | POA: Diagnosis not present

## 2017-04-28 DIAGNOSIS — E875 Hyperkalemia: Secondary | ICD-10-CM | POA: Diagnosis present

## 2017-04-28 DIAGNOSIS — I4891 Unspecified atrial fibrillation: Secondary | ICD-10-CM

## 2017-04-28 DIAGNOSIS — N184 Chronic kidney disease, stage 4 (severe): Secondary | ICD-10-CM | POA: Diagnosis present

## 2017-04-28 DIAGNOSIS — R0600 Dyspnea, unspecified: Secondary | ICD-10-CM

## 2017-04-28 DIAGNOSIS — Z4682 Encounter for fitting and adjustment of non-vascular catheter: Secondary | ICD-10-CM | POA: Diagnosis not present

## 2017-04-28 DIAGNOSIS — Z79899 Other long term (current) drug therapy: Secondary | ICD-10-CM

## 2017-04-28 DIAGNOSIS — Z7982 Long term (current) use of aspirin: Secondary | ICD-10-CM

## 2017-04-28 DIAGNOSIS — I5032 Chronic diastolic (congestive) heart failure: Secondary | ICD-10-CM | POA: Diagnosis present

## 2017-04-28 DIAGNOSIS — I130 Hypertensive heart and chronic kidney disease with heart failure and stage 1 through stage 4 chronic kidney disease, or unspecified chronic kidney disease: Principal | ICD-10-CM | POA: Diagnosis present

## 2017-04-28 DIAGNOSIS — J9811 Atelectasis: Secondary | ICD-10-CM | POA: Diagnosis not present

## 2017-04-28 DIAGNOSIS — Z8249 Family history of ischemic heart disease and other diseases of the circulatory system: Secondary | ICD-10-CM

## 2017-04-28 DIAGNOSIS — Z806 Family history of leukemia: Secondary | ICD-10-CM | POA: Diagnosis not present

## 2017-04-28 DIAGNOSIS — Z789 Other specified health status: Secondary | ICD-10-CM

## 2017-04-28 DIAGNOSIS — I350 Nonrheumatic aortic (valve) stenosis: Secondary | ICD-10-CM

## 2017-04-28 DIAGNOSIS — Z452 Encounter for adjustment and management of vascular access device: Secondary | ICD-10-CM | POA: Diagnosis not present

## 2017-04-28 LAB — BASIC METABOLIC PANEL
ANION GAP: 10 (ref 5–15)
BUN: 41 mg/dL — ABNORMAL HIGH (ref 6–20)
CALCIUM: 8.7 mg/dL — AB (ref 8.9–10.3)
CO2: 17 mmol/L — AB (ref 22–32)
Chloride: 110 mmol/L (ref 101–111)
Creatinine, Ser: 2.5 mg/dL — ABNORMAL HIGH (ref 0.61–1.24)
GFR calc Af Amer: 27 mL/min — ABNORMAL LOW (ref >60)
GFR calc non Af Amer: 23 mL/min — ABNORMAL LOW (ref >60)
Glucose, Bld: 139 mg/dL — ABNORMAL HIGH (ref 65–99)
POTASSIUM: 4.6 mmol/L (ref 3.5–5.1)
Sodium: 137 mmol/L (ref 135–145)

## 2017-04-28 LAB — CBC
HCT: 32 % — ABNORMAL LOW (ref 39.0–52.0)
Hemoglobin: 10.2 g/dL — ABNORMAL LOW (ref 13.0–17.0)
MCH: 31.3 pg (ref 26.0–34.0)
MCHC: 31.9 g/dL (ref 30.0–36.0)
MCV: 98.2 fL (ref 78.0–100.0)
Platelets: 152 10*3/uL (ref 150–400)
RBC: 3.26 MIL/uL — ABNORMAL LOW (ref 4.22–5.81)
RDW: 16.1 % — ABNORMAL HIGH (ref 11.5–15.5)
WBC: 9.1 10*3/uL (ref 4.0–10.5)

## 2017-04-28 LAB — CBC WITH DIFFERENTIAL/PLATELET
BASOS PCT: 0 %
Basophils Absolute: 0 10*3/uL (ref 0.0–0.1)
Eosinophils Absolute: 0 10*3/uL (ref 0.0–0.7)
Eosinophils Relative: 0 %
HEMATOCRIT: 31.7 % — AB (ref 39.0–52.0)
Hemoglobin: 10.3 g/dL — ABNORMAL LOW (ref 13.0–17.0)
LYMPHS PCT: 13 %
Lymphs Abs: 1 10*3/uL (ref 0.7–4.0)
MCH: 30.8 pg (ref 26.0–34.0)
MCHC: 32.5 g/dL (ref 30.0–36.0)
MCV: 94.9 fL (ref 78.0–100.0)
Monocytes Absolute: 0.3 10*3/uL (ref 0.1–1.0)
Monocytes Relative: 4 %
NEUTROS ABS: 6.4 10*3/uL (ref 1.7–7.7)
Neutrophils Relative %: 83 %
Platelets: 178 10*3/uL (ref 150–400)
RBC: 3.34 MIL/uL — ABNORMAL LOW (ref 4.22–5.81)
RDW: 16.3 % — ABNORMAL HIGH (ref 11.5–15.5)
WBC: 7.7 10*3/uL (ref 4.0–10.5)

## 2017-04-28 LAB — POCT I-STAT 3, ART BLOOD GAS (G3+)
Acid-base deficit: 24 mmol/L — ABNORMAL HIGH (ref 0.0–2.0)
Bicarbonate: 6.6 mmol/L — ABNORMAL LOW (ref 20.0–28.0)
O2 Saturation: 86 %
PCO2 ART: 27.4 mmHg — AB (ref 32.0–48.0)
PH ART: 6.987 — AB (ref 7.350–7.450)
Patient temperature: 97.1
TCO2: 7 mmol/L — AB (ref 22–32)
pO2, Arterial: 73 mmHg — ABNORMAL LOW (ref 83.0–108.0)

## 2017-04-28 LAB — GLUCOSE, CAPILLARY: Glucose-Capillary: 98 mg/dL (ref 65–99)

## 2017-04-28 LAB — BLOOD CULTURE ID PANEL (REFLEXED)
Acinetobacter baumannii: NOT DETECTED
CANDIDA ALBICANS: NOT DETECTED
CANDIDA KRUSEI: NOT DETECTED
CANDIDA TROPICALIS: NOT DETECTED
Candida glabrata: NOT DETECTED
Candida parapsilosis: NOT DETECTED
ENTEROBACTERIACEAE SPECIES: NOT DETECTED
ESCHERICHIA COLI: NOT DETECTED
Enterobacter cloacae complex: NOT DETECTED
Enterococcus species: NOT DETECTED
HAEMOPHILUS INFLUENZAE: NOT DETECTED
KLEBSIELLA OXYTOCA: NOT DETECTED
KLEBSIELLA PNEUMONIAE: NOT DETECTED
Listeria monocytogenes: NOT DETECTED
Methicillin resistance: NOT DETECTED
Neisseria meningitidis: NOT DETECTED
Proteus species: NOT DETECTED
Pseudomonas aeruginosa: NOT DETECTED
SERRATIA MARCESCENS: NOT DETECTED
STAPHYLOCOCCUS SPECIES: DETECTED — AB
Staphylococcus aureus (BCID): NOT DETECTED
Streptococcus agalactiae: NOT DETECTED
Streptococcus pneumoniae: NOT DETECTED
Streptococcus pyogenes: NOT DETECTED
Streptococcus species: NOT DETECTED

## 2017-04-28 LAB — HEPATIC FUNCTION PANEL
ALT: 33 U/L (ref 17–63)
AST: 65 U/L — ABNORMAL HIGH (ref 15–41)
Albumin: 3.7 g/dL (ref 3.5–5.0)
Alkaline Phosphatase: 67 U/L (ref 38–126)
BILIRUBIN DIRECT: 0.7 mg/dL — AB (ref 0.1–0.5)
BILIRUBIN INDIRECT: 1.5 mg/dL — AB (ref 0.3–0.9)
Total Bilirubin: 2.2 mg/dL — ABNORMAL HIGH (ref 0.3–1.2)
Total Protein: 7.6 g/dL (ref 6.5–8.1)

## 2017-04-28 LAB — POCT I-STAT, CHEM 8
BUN: 50 mg/dL — ABNORMAL HIGH (ref 6–20)
CALCIUM ION: 1.02 mmol/L — AB (ref 1.15–1.40)
Chloride: 108 mmol/L (ref 101–111)
Creatinine, Ser: 3.9 mg/dL — ABNORMAL HIGH (ref 0.61–1.24)
GLUCOSE: 114 mg/dL — AB (ref 65–99)
HCT: 34 % — ABNORMAL LOW (ref 39.0–52.0)
HEMOGLOBIN: 11.6 g/dL — AB (ref 13.0–17.0)
Potassium: 4.8 mmol/L (ref 3.5–5.1)
Sodium: 140 mmol/L (ref 135–145)
TCO2: 13 mmol/L — ABNORMAL LOW (ref 22–32)

## 2017-04-28 LAB — LACTIC ACID, PLASMA: LACTIC ACID, VENOUS: 11.3 mmol/L — AB (ref 0.5–1.9)

## 2017-04-28 LAB — I-STAT CG4 LACTIC ACID, ED
Lactic Acid, Venous: 2.63 mmol/L (ref 0.5–1.9)
Lactic Acid, Venous: 4.63 mmol/L (ref 0.5–1.9)

## 2017-04-28 LAB — PROCALCITONIN: Procalcitonin: 0.28 ng/mL

## 2017-04-28 LAB — ECHOCARDIOGRAM COMPLETE
Height: 72 in
Weight: 2320 oz

## 2017-04-28 LAB — I-STAT TROPONIN, ED: TROPONIN I, POC: 0.01 ng/mL (ref 0.00–0.08)

## 2017-04-28 LAB — MAGNESIUM: MAGNESIUM: 2.5 mg/dL — AB (ref 1.7–2.4)

## 2017-04-28 LAB — CBG MONITORING, ED: Glucose-Capillary: 130 mg/dL — ABNORMAL HIGH (ref 65–99)

## 2017-04-28 LAB — CREATININE, SERUM
Creatinine, Ser: 3.99 mg/dL — ABNORMAL HIGH (ref 0.61–1.24)
GFR calc Af Amer: 15 mL/min — ABNORMAL LOW (ref >60)
GFR calc non Af Amer: 13 mL/min — ABNORMAL LOW (ref >60)

## 2017-04-28 LAB — TSH: TSH: 6.791 u[IU]/mL — ABNORMAL HIGH (ref 0.350–4.500)

## 2017-04-28 LAB — TROPONIN I: Troponin I: <0.03 ng/mL (ref <0.03)

## 2017-04-28 LAB — BRAIN NATRIURETIC PEPTIDE: B Natriuretic Peptide: 691 pg/mL — ABNORMAL HIGH (ref 0.0–100.0)

## 2017-04-28 LAB — CORTISOL: CORTISOL PLASMA: 40.5 ug/dL

## 2017-04-28 LAB — PHOSPHORUS: Phosphorus: 5.9 mg/dL — ABNORMAL HIGH (ref 2.5–4.6)

## 2017-04-28 LAB — MRSA PCR SCREENING: MRSA BY PCR: POSITIVE — AB

## 2017-04-28 MED ORDER — NITROGLYCERIN 0.4 MG SL SUBL: 0.4000 mg | SUBLINGUAL_TABLET | SUBLINGUAL | Status: DC | PRN

## 2017-04-28 MED ORDER — SODIUM BICARBONATE 8.4 % IV SOLN
100.0000 meq | Freq: Once | INTRAVENOUS | Status: AC
  Administered 2017-04-28: 100 meq via INTRAVENOUS

## 2017-04-28 MED ORDER — HEPARIN SODIUM (PORCINE) 5000 UNIT/ML IJ SOLN
5000.0000 [IU] | Freq: Three times a day (TID) | INTRAMUSCULAR | Status: DC
  Administered 2017-04-28: 5000 [IU] via SUBCUTANEOUS
  Filled 2017-04-28: qty 1

## 2017-04-28 MED ORDER — MIDAZOLAM HCL 2 MG/2ML IJ SOLN
2.0000 mg | Freq: Once | INTRAMUSCULAR | Status: AC
  Administered 2017-04-28: 2 mg via INTRAVENOUS

## 2017-04-28 MED ORDER — ASPIRIN 81 MG PO CHEW: 324.0000 mg | CHEWABLE_TABLET | ORAL | Status: DC

## 2017-04-28 MED ORDER — GLUCAGON HCL RDNA (DIAGNOSTIC) 1 MG IJ SOLR
1.0000 mg | Freq: Once | INTRAMUSCULAR | Status: AC
Start: 2017-04-28 — End: 2017-04-28
  Administered 2017-04-28: 1 mg via INTRAVENOUS
  Filled 2017-04-28: qty 1

## 2017-04-28 MED ORDER — SODIUM CHLORIDE 0.9 % IV SOLN: 250.0000 mL | INTRAVENOUS | Status: DC | PRN

## 2017-04-28 MED ORDER — ROCURONIUM BROMIDE 50 MG/5ML IV SOLN
75.0000 mg | Freq: Once | INTRAVENOUS | Status: AC
  Administered 2017-04-28: 75 mg via INTRAVENOUS

## 2017-04-28 MED ORDER — FENTANYL CITRATE (PF) 100 MCG/2ML IJ SOLN
100.0000 ug | Freq: Once | INTRAMUSCULAR | Status: AC
  Administered 2017-04-28: 100 ug via INTRAVENOUS

## 2017-04-28 MED ORDER — MOMETASONE FURO-FORMOTEROL FUM 100-5 MCG/ACT IN AERO
2.0000 | INHALATION_SPRAY | Freq: Two times a day (BID) | RESPIRATORY_TRACT | Status: DC
  Administered 2017-04-28: 2 via RESPIRATORY_TRACT
  Filled 2017-04-28: qty 8.8

## 2017-04-28 MED ORDER — ATROPINE SULFATE 1 MG/10ML IJ SOSY
PREFILLED_SYRINGE | INTRAMUSCULAR | Status: AC
  Administered 2017-04-28: 0.5 mg
  Filled 2017-04-28: qty 10

## 2017-04-28 MED ORDER — ALBUTEROL SULFATE (2.5 MG/3ML) 0.083% IN NEBU
2.5000 mg | INHALATION_SOLUTION | Freq: Four times a day (QID) | RESPIRATORY_TRACT | Status: DC | PRN
  Administered 2017-04-28: 2.5 mg via RESPIRATORY_TRACT
  Filled 2017-04-28: qty 3

## 2017-04-28 MED ORDER — DOPAMINE-DEXTROSE 3.2-5 MG/ML-% IV SOLN
0.0000 ug/kg/min | INTRAVENOUS | Status: DC
  Administered 2017-04-28 – 2017-04-29 (×3): 20 ug/kg/min via INTRAVENOUS
  Filled 2017-04-28 (×2): qty 250

## 2017-04-28 MED ORDER — ASPIRIN 300 MG RE SUPP: 300.0000 mg | RECTAL | Status: DC

## 2017-04-28 MED ORDER — AMIODARONE HCL IN DEXTROSE 360-4.14 MG/200ML-% IV SOLN
INTRAVENOUS | Status: AC
  Filled 2017-04-28: qty 200

## 2017-04-28 MED ORDER — ETOMIDATE 2 MG/ML IV SOLN
20.0000 mg | Freq: Once | INTRAVENOUS | Status: AC
  Administered 2017-04-28: 20 mg via INTRAVENOUS

## 2017-04-28 MED ORDER — ONDANSETRON HCL 4 MG/2ML IJ SOLN
4.0000 mg | Freq: Once | INTRAMUSCULAR | Status: AC
  Administered 2017-04-28: 4 mg via INTRAVENOUS

## 2017-04-28 MED ORDER — MIDAZOLAM HCL 2 MG/2ML IJ SOLN
INTRAMUSCULAR | Status: AC
  Administered 2017-04-28: 2 mg via INTRAVENOUS
  Filled 2017-04-28: qty 2

## 2017-04-28 MED ORDER — NOREPINEPHRINE BITARTRATE 1 MG/ML IV SOLN
0.0000 ug/min | Freq: Once | INTRAVENOUS | Status: DC
  Filled 2017-04-28: qty 4

## 2017-04-28 MED ORDER — ALBUTEROL SULFATE HFA 108 (90 BASE) MCG/ACT IN AERS: 2.0000 | INHALATION_SPRAY | Freq: Four times a day (QID) | RESPIRATORY_TRACT | Status: DC | PRN

## 2017-04-28 MED ORDER — EPINEPHRINE PF 1 MG/ML IJ SOLN
0.5000 ug/min | INTRAMUSCULAR | Status: DC
  Administered 2017-04-28: 2 ug/min via INTRAVENOUS
  Administered 2017-04-29 (×2): 20 ug/min via INTRAVENOUS
  Filled 2017-04-28 (×3): qty 4

## 2017-04-28 MED ORDER — EPINEPHRINE PF 1 MG/10ML IJ SOSY
0.1000 mg | PREFILLED_SYRINGE | Freq: Once | INTRAMUSCULAR | Status: AC
  Administered 2017-04-28: 0.1 mg via INTRAVENOUS

## 2017-04-28 MED ORDER — ASPIRIN EC 81 MG PO TBEC: 81.0000 mg | DELAYED_RELEASE_TABLET | Freq: Every day | ORAL | Status: DC

## 2017-04-28 MED ORDER — DEXTROSE 5 % IV SOLN
0.0000 ug/min | INTRAVENOUS | Status: DC
  Administered 2017-04-28: 40 ug/min via INTRAVENOUS
  Administered 2017-04-28: 5 ug/min via INTRAVENOUS
  Filled 2017-04-28: qty 4

## 2017-04-28 MED ORDER — ACETAMINOPHEN 325 MG PO TABS
650.0000 mg | ORAL_TABLET | ORAL | Status: DC | PRN
  Administered 2017-04-28: 650 mg via ORAL
  Filled 2017-04-28: qty 2

## 2017-04-28 MED ORDER — FENTANYL CITRATE (PF) 100 MCG/2ML IJ SOLN
INTRAMUSCULAR | Status: AC
  Administered 2017-04-28: 100 ug via INTRAVENOUS
  Filled 2017-04-28: qty 4

## 2017-04-28 MED ORDER — ONDANSETRON HCL 4 MG/2ML IJ SOLN
4.0000 mg | Freq: Four times a day (QID) | INTRAMUSCULAR | Status: DC | PRN
  Administered 2017-04-28: 4 mg via INTRAVENOUS
  Filled 2017-04-28: qty 2

## 2017-04-28 MED ORDER — PANTOPRAZOLE SODIUM 40 MG PO TBEC
40.0000 mg | DELAYED_RELEASE_TABLET | Freq: Two times a day (BID) | ORAL | Status: DC
  Administered 2017-04-28: 40 mg via ORAL
  Filled 2017-04-28: qty 1

## 2017-04-28 MED ORDER — ALLOPURINOL 100 MG PO TABS
100.0000 mg | ORAL_TABLET | Freq: Two times a day (BID) | ORAL | Status: DC
  Administered 2017-04-28: 100 mg via ORAL
  Filled 2017-04-28: qty 1

## 2017-04-28 MED ORDER — ORAL CARE MOUTH RINSE
15.0000 mL | Freq: Two times a day (BID) | OROMUCOSAL | Status: DC
  Administered 2017-04-28: 15 mL via OROMUCOSAL

## 2017-04-28 MED ORDER — DOPAMINE-DEXTROSE 3.2-5 MG/ML-% IV SOLN
0.0000 ug/kg/min | Freq: Once | INTRAVENOUS | Status: AC
  Administered 2017-04-28: 5.025 ug/kg/min via INTRAVENOUS
  Filled 2017-04-28: qty 250

## 2017-04-28 MED ORDER — ONDANSETRON HCL 4 MG/2ML IJ SOLN
INTRAMUSCULAR | Status: AC
  Filled 2017-04-28: qty 2

## 2017-04-28 MED ORDER — NOREPINEPHRINE BITARTRATE 1 MG/ML IV SOLN
0.0000 ug/min | INTRAVENOUS | Status: DC
  Administered 2017-04-29: 40 ug/min via INTRAVENOUS
  Filled 2017-04-28: qty 16

## 2017-04-28 NOTE — Progress Notes (Signed)
Interventional Cardiology Note:   Called by Dr. Kenton Kingfisher, on call cardiology, to place temporary transvenous pacemaker. [name] was admitted earlier today with weakness. He was bradycardic on admission. He has severe COPD, known chronic diastolic CHF, chronic atrial fib, stage 3 CKD and failure to thrive. He was managed with dopamine through the day with response in HR. Earlier this evening he began to have abdominal pain and HR noted to be in the 40s. He then had a PEA arrest and was successfully resuscitated. He was placed on Levophed and epinephrine drips along with dopamine drip. He was intubated. HR in the 55-65 range. Echo reviewed by me and shows small RV and LV with overall normal appearance in RV and LV function but very large right and left atria. There is severe MR and severe TR. His echo appearance is c/w a restrictive cardiomyopathy. No evidence of pericardial effusion.   Following the PEA arrest, pH of 6.95, lactic acid 11.3. Creatinine 3.99 up from 2.5 earlier today.   I discussed the case with our on call Advanced CHF team/shock team doctor. The etiology of his acute decompensation is not clear. He likely is in the final stages of advanced heart failure. It is not clear that there is an acute intervention that would reverse his downward hemodynamic spiral. I do not think a temporary pacemaker would be beneficial. He would likely need HD tomorrow and given his reduced cardiac output, he would not likely tolerate HD. He is not a surgical candidate for correction of his valve disease. He is on three pressors currently with BP of 69/44.   I have spoken to his brother, [name], on the phone tonight. The patient is not married and has no children. His brother is his next of kin. After discussion with the entire care team, we have decided not to advance his level of care tonight. He is now a DNR. We will continue the current drips but will not increase the drips. No advancement of care. The  drips will be continued until his family can arrive in the am. His brother is aware that he may not survive until the morning.   Lauree Chandler 05/12/2017 11:13 PM

## 2017-04-28 NOTE — Consult Note (Signed)
PULMONARY / CRITICAL CARE MEDICINE   Name: [name] MRN: [id number] DOB: 08-17-1936    ADMISSION DATE:  05/19/2017 CONSULTATION DATE:  05/13/2017  REFERRING MD:  Dr Dina Rich  CHIEF COMPLAINT:  SOB  HISTORY OF PRESENT ILLNESS:   Reports increasing SOB and LE edema x 2 months significantly worse in past 2 days.  No fevers chills cough chest pain, palpitations, syncope, orthopnea. Pt provides poor HPI.  He reports htn, no Hx mi or PCI.  No known CHF.  He has COPD.  PAST MEDICAL HISTORY :  He  has a past medical history of Abdominal aortic aneurysm ([place]); Alcoholic hepatitis; Allergy; Anemia; Atrial fibrillation ([place]); Blood transfusion without reported diagnosis; CHF (congestive heart failure) ([place]); Chronic kidney disease; COPD (chronic obstructive pulmonary disease) ([place]); Gout; Hypertension; Iliac artery aneurysm, bilateral ([place]); Lung mass; Osteoporosis; Oxygen dependent; and Tibial plateau fracture.  PAST SURGICAL HISTORY: He  has a past surgical history that includes Abdominal aortic aneurysm repair; Bladder surgery; and neck/back surgery.  No Known Allergies  No current facility-administered medications on file prior to encounter.    Current Outpatient Prescriptions on File Prior to Encounter  Medication Sig  . albuterol (PROVENTIL HFA;VENTOLIN HFA) 108 (90 BASE) MCG/ACT inhaler Inhale 2 puffs into the lungs every 6 (six) hours as needed for wheezing or shortness of breath. For wheezing   . allopurinol (ZYLOPRIM) 100 MG tablet Take 100 mg by mouth 2 (two) times daily.  Marland Kitchen amLODipine (NORVASC) 10 MG tablet Take 10 mg by mouth daily.  Marland Kitchen aspirin EC 81 MG tablet Take 81 mg by mouth daily.  . budesonide-formoterol (SYMBICORT) 80-4.5 MCG/ACT inhaler Inhale 2 puffs into the lungs 2 (two) times daily.  Marland Kitchen diltiazem (DILACOR XR) 180 MG 24 hr capsule Take 180 mg by mouth daily.  . furosemide (LASIX) 40 MG tablet Take 40 mg by mouth daily.  . pantoprazole (PROTONIX) 40 MG tablet Take 40 mg by  mouth 2 (two) times daily.    FAMILY HISTORY:  His indicated that the status of his mother is unknown. He indicated that the status of his father is unknown.    SOCIAL HISTORY: He  reports that he quit smoking about 7 years ago. His smoking use included Cigarettes. He has a 10.00 pack-year smoking history. He has never used smokeless tobacco. He reports that he drinks alcohol. He reports that he does not use drugs.  REVIEW OF SYSTEMS:   As per hpi all other systems reviewed and negtaive  SUBJECTIVE:  SOB improved since coming to ED  VITAL SIGNS: BP 93/69   Pulse (!) 55   Temp (!) 96.5 F (35.8 C) (Rectal)   Resp 18   Ht 6' (1.829 m)   Wt 145 lb (65.8 kg)   SpO2 93%   BMI 19.67 kg/m   HEMODYNAMICS:    VENTILATOR SETTINGS:    INTAKE / OUTPUT: No intake/output data recorded.  PHYSICAL EXAMINATION: General:  Underweught, appears chronically ill and older than stated age Neuro:  Sleepy, responds to voice oriented x3, mae, non-focal HEENT:  Atnc, eomi, perrla, dry pale mm Cardiovascular:  irr-irr, brady, no murmur, cap refill >2sec, 3+ LE edema Lungs:  Rales, no wheezing, good vt,  Abdomen:  mildy protuberant, soft ntnd Musculoskeletal:  No red warm swollen tender deformed joints Skin:  Pale cool diaphoretic  LABS:per ED POC troponin wnl  BMET  Recent Labs Lab 04/28/17 0117  NA 137  K 4.6  CL 110  CO2 17*  BUN 41*  CREATININE 2.50*  GLUCOSE 139*    Electrolytes  Recent Labs Lab 05/15/2017 0117  CALCIUM 8.7*    CBC  Recent Labs Lab 05/17/2017 0117  WBC 7.7  HGB 10.3*  HCT 31.7*  PLT 178    Coag's No results for input(s): APTT, INR in the last 168 hours.  Sepsis Markers No results for input(s): LATICACIDVEN, PROCALCITON, O2SATVEN in the last 168 hours.  ABG No results for input(s): PHART, PCO2ART, PO2ART in the last 168 hours.  Liver Enzymes No results for input(s): AST, ALT, ALKPHOS, BILITOT, ALBUMIN in the last 168 hours.  Cardiac  Enzymes No results for input(s): TROPONINI, PROBNP in the last 168 hours.  Glucose  Recent Labs Lab 05/14/2017 0354  GLUCAP 130*    Imaging Dg Chest 2 View  Result Date: 05/19/2017 CLINICAL DATA:  Shortness of breath. Bilateral lower extremity edema. Productive cough. EXAM: CHEST  2 VIEW COMPARISON:  Radiographs 12/04/2011 FINDINGS: Progressive cardiomegaly from prior exam. Tortuosity and atherosclerosis of the thoracic aorta. There is peribronchial cuffing the similar prior exams. Streaky bibasilar opacities likely atelectasis or scarring. Possible small left pleural effusion. No confluent airspace disease. The bones appear under mineralized. IMPRESSION: Increase cardiomegaly. Peribronchial cuffing suspicious for pulmonary edema. Possible small left pleural effusion. Suspect mild CHF. Streaky bibasilar opacities consistent with atelectasis or scarring. Electronically Signed   By: Jeb Levering M.D.   On: 05/08/2017 01:46     STUDIES:  EKGs show afib with slow rate, no ST elevations POCUS shows numerous B lines bilateral, no pericardial effusion, thick LV, slow rate. EF appears normal, IVC dilated and no collapse  CULTURES: BCX drawn  ANTIBIOTICS: none  SIGNIFICANT EVENTS: 05/10/2017 Dopamine in ED for cardiogenic shock and bradycardia  LINES/TUBES: Rt IJ 05/24/2017  DISCUSSION: Pt appears symptomatic from cardiogenic shock as evidenced by LE edema, JVD, dialted IVC, pulmonary edema, poor cap refill and elevated BNP.  Afib with slow HR may be the culprit as pt and ed provider note improvement on dopa infusion and HR in 60s.  Unclear cause of decompensation w/normal troponin.  May be remote recent MI.  He may have  aki from labs 1 month ago and that may be contrbuting but had same Cr 2yrs ago.He is hypothermic and hypothyroid is possible and ED is checking TSH.  He takes CCB and that may contribute.  Adrenal insufficiency is possible and CCB would more likely cause hyperkalemia though.    ASSESSMENT / PLAN:  PULMONARY A:  Appears comfortable at present pulm edema copd P:   rec duonebs, CPAP/bipap may be helpful if develops distress  CARDIOVASCULAR A:  Cardiogenic shock Bradycardia afib P:  Tele Pacer pads Emergent cardiology eval C/w dopa Check Mag Serial ECG and troponins  RENAL A:   AKI NAGMA P:   May need renal consult  GASTROINTESTINAL A:   No active issue P:   NPO  HEMATOLOGIC A:   DVT PPX P:  Suggest [place] and SCDs  INFECTIOUS A:   Elevated lactate, hypotension r/o sepsis P:   F/u BCx No abx Check procal  ENDOCRINE A:   R/o hypothyroid R/o adrenal insufficiency P:   Check TSH and cortisol  NEUROLOGIC A:   Lethargy may be 2/2 shock P:   RASS goal: 0 Improve hemodynamics   FAMILY  - Updates:   - Inter-disciplinary family meet or Palliative Care meeting due by:  05/05/17  Upon my evaluation, this patient had a high probability of imminent or life-threatening deterioration due to shock  high complexity decision making to assess, manipulate, and support vital organ system failure including dopamine infusion,. Bed side ultrasound interpretation  I have personally provided 60 minutes of critical care time exclusive of time spent on separately billable procedures and education. Time includes review and summation of previous medical record, laboratory data, radiology results, independent review of CXR, EKG, coordination of care with ED MD, and monitoring for potential decompensation. Interventions were performed as documented above.  Condition: critical Prognosis: guarded Code Status: full  Charlesetta Garibaldi, MD [place] Pager: [phone]  05/23/2017, 5:35 AM

## 2017-04-28 NOTE — Procedures (Signed)
Procedure Name: Intubation Date/Time: 05/17/2017 11:05 PM Performed by: Charlesetta Garibaldi Pre-anesthesia Checklist: Patient identified, Emergency Drugs available, Suction available, Patient being monitored and Timeout performed Oxygen Delivery Method: Non-rebreather mask Preoxygenation: Pre-oxygenation with 100% oxygen Induction Type: Rapid sequence and IV induction Ventilation: Mask ventilation without difficulty and Oral airway inserted - appropriate to patient size Laryngoscope Size: Glidescope Grade View: Grade I Tube type: Non-subglottic suction tube Tube size: 7.5 mm Number of attempts: 1 Airway Equipment and Method: Video-laryngoscopy,  Rigid stylet and Oral airway Placement Confirmation: ETT inserted through vocal cords under direct vision,  CO2 detector and Breath sounds checked- equal and bilateral Secured at: 24 cm Tube secured with: ETT holder Comments: Consent obtained from brother Johnny on telephone, patient encephalopathic

## 2017-04-28 NOTE — Procedures (Signed)
Arterial Catheter Insertion Procedure Note [name] [id number] [birth year]  Procedure: Insertion of Arterial Catheter  Indications: Blood pressure monitoring and Frequent blood sampling  Procedure Details Consent: Risks of procedure as well as the alternatives and risks of each were explained to the (patient/caregiver).  Consent for procedure obtained. Time Out: Verified patient identification, verified procedure, site/side was marked, verified correct patient position, special equipment/implants available, medications/allergies/relevent history reviewed, required imaging and test results available.  Performed  Maximum sterile technique was used including antiseptics, cap, gloves, gown, hand hygiene, mask and sheet. Skin prep: Chlorhexidine; local anesthetic administered 20 gauge catheter was inserted into right radial artery using the Seldinger technique.  Evaluation Blood flow good; BP tracing good. Complications: No apparent complications.   [name] 05/25/2017

## 2017-04-28 NOTE — H&P (Signed)
Cardiology Admission History and Physical:   Patient ID: [name]; MRN: [id number]; DOB: [birth year]   Admission date: 05/08/2017  Primary Care Provider: Inda Coke, PA Primary Cardiologist: Aundra Dubin Primary Electrophysiologist:  None  Chief Complaint:  Weakness   History of Present Illness:   [name] 81 y.o. with severe COPD on home oxygen chronic afib not on anticoagulation due to previous GI bleed history of diastolic CHF , stage 3 CRF weight loss and FTT. Admitted with weakness In ER noted to be in slow afib and hypotensive. Afib rates 30's TSH in 6 range  K 4.6 Cr 1.96 Patient has no chest pain , syncope not clear that he has been taking any meds at home. Currently sleeping on side with sats 100% on 3L. Initial troponin is negative. No fever denies cough sputum or urinary burning. Given dopamine by CCM and RIJ inserted HR in 70-80 range on dopamine BP still borderline 80/palp No other signs of sepsis   Past Medical History:  Diagnosis Date  . Abdominal aortic aneurysm (HCC)    s/p stent graft repair in 6/12  . Alcoholic hepatitis   . Allergy   . Anemia   . Atrial fibrillation (HCC)    no coumadin due to h/o GI bleeding. 05/2011 stopped  metoprolol, amiodarone, digoxin, diltiazem due to bradycardia   . Blood transfusion without reported diagnosis   . CHF (congestive heart failure) (HCC)    Diastolic Heart failure, Hx of severe MR;  Echocardiogram 05/01/11: Mild LVH, EF 55-60%, mild AI, severe MR, severe LAE, moderate RAE, normal RVSF, moderate-severe TR, PASP 79, severe pulmonary hypertension  . Chronic kidney disease    Stage II  . COPD (chronic obstructive pulmonary disease) (HCC)    emphysema and severe outflow obstruction, on home O2  . Gout   . Hypertension   . Iliac artery aneurysm, bilateral ([place])   . Lung mass    right lower lobe spiculated mass  . Osteoporosis   . Oxygen dependent   . Tibial plateau fracture    non displaced    Past Surgical  History:  Procedure Laterality Date  . ABDOMINAL AORTIC ANEURYSM REPAIR    . BLADDER SURGERY    . neck/back surgery       Medications Prior to Admission: Prior to Admission medications   Medication Sig Start Date End Date Taking? Authorizing Provider  albuterol (PROVENTIL HFA;VENTOLIN HFA) 108 (90 BASE) MCG/ACT inhaler Inhale 2 puffs into the lungs every 6 (six) hours as needed for wheezing or shortness of breath. For wheezing    Yes [provider]  allopurinol (ZYLOPRIM) 100 MG tablet Take 100 mg by mouth 2 (two) times daily.   Yes [provider]  amLODipine (NORVASC) 10 MG tablet Take 10 mg by mouth daily.   Yes [provider]  aspirin EC 81 MG tablet Take 81 mg by mouth daily.   Yes [provider]  budesonide-formoterol (SYMBICORT) 80-4.5 MCG/ACT inhaler Inhale 2 puffs into the lungs 2 (two) times daily. 03/18/17  Yes Inda Coke, PA  diltiazem (DILACOR XR) 180 MG 24 hr capsule Take 180 mg by mouth daily.   Yes [provider]  furosemide (LASIX) 40 MG tablet Take 40 mg by mouth daily.   Yes [provider]  pantoprazole (PROTONIX) 40 MG tablet Take 40 mg by mouth 2 (two) times daily.   Yes [provider]     Allergies:   No Known Allergies  Social History:   Social  History   Social History  . Marital status: Single    Spouse name: N/A  . Number of children: 0  . Years of education: N/A   Occupational History  . retired Retired   Social History Main Topics  . Smoking status: Former Smoker    Packs/day: 0.50    Years: 20.00    Types: Cigarettes    Quit date: 08/26/2009  . Smokeless tobacco: Never Used  . Alcohol use Yes     Comment: rare  . Drug use: No  . Sexual activity: Yes   Other Topics Concern  . Not on file   Social History Narrative   Lives alone, but has a sister across the street and a brother also. Never married, no kids. Ambulatory with a cane and walker. Denies smoking, drinking,  drugs at presents. Previously had visiting nurse but not now.       Used to work Primary school teacher. Highest level of education is 8th grade.    Family History:    The patient's family history includes Anemia in his mother; Coronary artery disease in his mother; Emphysema in his father; Heart disease in his mother; Hypertension in his mother; Leukemia in his father; Rheum arthritis in his mother.    ROS:  Please see the history of present illness.  All other ROS reviewed and negative.     Physical Exam/Data:   Vitals:   04/27/2017 0550 05/08/2017 0600 05/03/2017 0628 05/17/2017 0645  BP: 98/65 99/68 106/60 (!) 74/58  Pulse: 80 (!) 35 69 78  Resp: 17 18 18 17   Temp:      TempSrc:      SpO2: (!) 88% 90% 96% 99%  Weight:      Height:       No intake or output data in the 24 hours ending 05/01/2017 0708 Filed Weights   05/23/2017 0111  Weight: 145 lb (65.8 kg)   Body mass index is 19.67 kg/m.  General:  Poorly kept thin black male  HEENT: normal Lymph: no adenopathy Neck: Right IJ Endocrine:  No thryomegaly Vascular: No carotid bruits; FA pulses 2+ bilaterally without bruits  Cardiac:  normal S1, S2; RRR; no murmur   Lungs:  clear to auscultation bilaterally, no wheezing, rhonchi or rales  Abd: soft, nontender, no hepatomegaly  Ext: trace  edema Musculoskeletal:  No deformities, BUE and BLE strength normal and equal Skin: warm and dry  Neuro:  CNs 2-12 intact, no focal abnormalities noted Psych:  Normal affect    EKG:  The ECG that was done 9/3/1   was personally reviewed and demonstrates afib nonspecific ST changes no acute ST elevation  Relevant CV Studies: Per ER doctor bedside echo with severe LVH normal EF  Laboratory Data:  Chemistry Recent Labs Lab 05/18/2017 0117  NA 137  K 4.6  CL 110  CO2 17*  GLUCOSE 139*  BUN 41*  CREATININE 2.50*  CALCIUM 8.7*  GFRNONAA 23*  GFRAA 27*  ANIONGAP 10     Recent Labs Lab 05/01/2017 0508  PROT 7.6  ALBUMIN 3.7  AST 65*    ALT 33  ALKPHOS 67  BILITOT 2.2*   Hematology Recent Labs Lab 05/08/2017 0117  WBC 7.7  RBC 3.34*  HGB 10.3*  HCT 31.7*  MCV 94.9  MCH 30.8  MCHC 32.5  RDW 16.3*  PLT 178   Cardiac Enzymes Recent Labs Lab 05/13/2017 0508  TROPONINI <0.03   No results for input(s): TROPIPOC in the last 168 hours.  BNP Recent Labs Lab 05/12/2017 0117  BNP 691.0*    DDimer No results for input(s): DDIMER in the last 168 hours.  Radiology/Studies:  Dg Chest 2 View  Result Date: 05/23/2017 CLINICAL DATA:  Shortness of breath. Bilateral lower extremity edema. Productive cough. EXAM: CHEST  2 VIEW COMPARISON:  Radiographs 12/04/2011 FINDINGS: Progressive cardiomegaly from prior exam. Tortuosity and atherosclerosis of the thoracic aorta. There is peribronchial cuffing the similar prior exams. Streaky bibasilar opacities likely atelectasis or scarring. Possible small left pleural effusion. No confluent airspace disease. The bones appear under mineralized. IMPRESSION: Increase cardiomegaly. Peribronchial cuffing suspicious for pulmonary edema. Possible small left pleural effusion. Suspect mild CHF. Streaky bibasilar opacities consistent with atelectasis or scarring. Electronically Signed   By: Jeb Levering M.D.   On: 05/06/2017 01:46   Dg Chest Portable 1 View  Result Date: 05/23/2017 CLINICAL DATA:  Central line placement EXAM: PORTABLE CHEST 1 VIEW COMPARISON:  05/06/2017 FINDINGS: The new right jugular central line extends into the right atrium and down to the bottom edge of this image. No pneumothorax. Interstitial coarsening throughout both lungs. Moderate cardiomegaly and aortic tortuosity. IMPRESSION: Right jugular central line extends at least to the right atrium and probably continues beyond the inferior edge of this image. No pneumothorax. Electronically Signed   By: Andreas Newport M.D.   On: 05/07/2017 05:44    Assessment and Plan:   1. Bradycardia:  Not clear if he was on home cardizem  Seems to have responded to dopamine would continue for 24 hours then wean and see if HR improves. No evidence of hyperkalemia. Don not think this is primary issue as patient is in chronic afib with relatively slow rates. Would just use DVT prophylaxis heparin as patient has history of GI bleed and not deemed a coumadin candidate There is no evidence of acute MI 2. Hypotension: hydrate NS given azotemia r/o sepsis stat TTE history indicates severe MR not heard on exam Per ER EF normal by bedside echo and did not comment on large pericardial effusion  3. COPD:  On oxygen sating well despite mild elevation BNP CXR with no active CHF and patient laying in bed flat with no dyspnea  ]Patient would appear to be more suited for CCM admission   Severity of Illness: The appropriate patient status for this patient is INPATIENT. Inpatient status is judged to be reasonable and necessary in order to provide the required intensity of service to ensure the patient's safety. The patient's presenting symptoms, physical exam findings, and initial radiographic and laboratory data in the context of their chronic comorbidities is felt to place them at high risk for further clinical deterioration. Furthermore, it is not anticipated that the patient will be medically stable for discharge from the hospital within 2 midnights of admission. The following factors support the patient status of inpatient.   " The patient's presenting symptoms include hypotension and bradycardia. " The worrisome physical exam findings include JVP elevation. " The initial radiographic and laboratory data are worrisome because of Cardiomegaly. " The chronic co-morbidities include Afib and COPD.   * I certify that at the point of admission it is my clinical judgment that the patient will require inpatient hospital care spanning beyond 2 midnights from the point of admission due to high intensity of service, high risk for further deterioration and high  frequency of surveillance required.*    Signed, Jenkins Rouge, MD  05/13/2017 7:08 AM

## 2017-04-28 NOTE — ED Provider Notes (Addendum)
[facility] DEPT Provider Note   CSN: [id number] Arrival date & time: 05/12/2017  0044     History   Chief Complaint Chief Complaint  Patient presents with  . Shortness of Breath  . Leg Swelling    HPI [name] is a 81 y.o. male.  HPI  This is a 81 year old male with a history of AAA status post repair, atrial fibrillation, diastolic heart failure, chronic kidney disease, COPD who presents with shortness of breath. Patient states he has had 2 day history of worsening shortness of breath. Also reports new lower extremity edema. Denies fever. Does report some cough. History of COPD. On 2 L of oxygen at baseline. Reports shortness of breath with lying flat. He does not have a cardiologist. Does report that his primary physician wants him to follow-up with a cardiologist. Denies chest pain, fevers, abdominal pain, nausea, vomiting.  Past Medical History:  Diagnosis Date  . Abdominal aortic aneurysm (HCC)    s/p stent graft repair in 6/12  . Alcoholic hepatitis   . Allergy   . Anemia   . Atrial fibrillation (HCC)    no coumadin due to h/o GI bleeding. 05/2011 stopped  metoprolol, amiodarone, digoxin, diltiazem due to bradycardia   . Blood transfusion without reported diagnosis   . CHF (congestive heart failure) (HCC)    Diastolic Heart failure, Hx of severe MR;  Echocardiogram 05/01/11: Mild LVH, EF 55-60%, mild AI, severe MR, severe LAE, moderate RAE, normal RVSF, moderate-severe TR, PASP 79, severe pulmonary hypertension  . Chronic kidney disease    Stage II  . COPD (chronic obstructive pulmonary disease) (HCC)    emphysema and severe outflow obstruction, on home O2  . Gout   . Hypertension   . Iliac artery aneurysm, bilateral ([place])   . Lung mass    right lower lobe spiculated mass  . Osteoporosis   . Oxygen dependent   . Tibial plateau fracture    non displaced    Patient Active Problem List   Diagnosis Date Noted  . Cardiogenic shock ([place]) 05/13/2017  .  Bradycardia 05/24/2017  . A-fib ([place]) 05/21/2017  . CKD (chronic kidney disease), stage IV ([place]) 05/24/2017  . Knee osteoarthritis 03/04/2017  . Abdominal aneurysm without mention of rupture 01/30/2012  . Acute respiratory failure with hypoxia ([place]) 11/28/2011  . Acute pulmonary edema with congestive heart failure ([place]) 11/28/2011  . Acute renal failure ([place]) 11/28/2011  . NSVT (nonsustained ventricular tachycardia) ([place]) 11/22/2011  . Atrial fibrillation with RVR ([place]) 11/17/2011  . Chronic diastolic heart failure ([place]) 06/17/2011  . Hypertension 06/17/2011  . Chronic kidney disease 06/17/2011  . COPD (chronic obstructive pulmonary disease) ([place]) 06/17/2011  . S/P AAA repair 06/17/2011  . DOE (dyspnea on exertion) 06/11/2011  . ANEMIA, SEVERE 03/16/2010  . Severe mitral regurgitation 03/16/2010  . Paroxysmal atrial fibrillation 03/16/2010  . CHF 03/16/2010  . ESOPHAGEAL ULCER, WITH BLEEDING 03/16/2010    Past Surgical History:  Procedure Laterality Date  . ABDOMINAL AORTIC ANEURYSM REPAIR    . BLADDER SURGERY    . neck/back surgery         Home Medications    Prior to Admission medications   Medication Sig Start Date End Date Taking? Authorizing Provider  albuterol (PROVENTIL HFA;VENTOLIN HFA) 108 (90 BASE) MCG/ACT inhaler Inhale 2 puffs into the lungs every 6 (six) hours as needed for wheezing or shortness of breath. For wheezing    Yes [provider]  allopurinol (ZYLOPRIM) 100 MG tablet Take  100 mg by mouth 2 (two) times daily.   Yes [provider]  amLODipine (NORVASC) 10 MG tablet Take 10 mg by mouth daily.   Yes [provider]  aspirin EC 81 MG tablet Take 81 mg by mouth daily.   Yes [provider]  budesonide-formoterol (SYMBICORT) 80-4.5 MCG/ACT inhaler Inhale 2 puffs into the lungs 2 (two) times daily. 03/18/17  Yes Inda Coke, PA  diltiazem (DILACOR XR) 180 MG 24 hr capsule Take 180 mg by mouth daily.   Yes [provider]  furosemide (LASIX) 40 MG tablet Take 40 mg by mouth daily.   Yes [provider]  pantoprazole (PROTONIX) 40 MG tablet Take 40 mg by mouth 2 (two) times daily.   Yes [provider]    Family History Family History  Problem Relation Age of Onset  . Emphysema Father   . Leukemia Father   . Heart disease Mother   . Rheum arthritis Mother   . Anemia Mother   . Hypertension Mother   . Coronary artery disease Mother     Social History Social History  Substance Use Topics  . Smoking status: Former Smoker    Packs/day: 0.50    Years: 20.00    Types: Cigarettes    Quit date: 08/26/2009  . Smokeless tobacco: Never Used  . Alcohol use Yes     Comment: rare     Allergies   Patient has no known allergies.   Review of Systems Review of Systems  Constitutional: Negative for fever.  Respiratory: Positive for cough and shortness of breath.   Cardiovascular: Positive for leg swelling. Negative for chest pain.  Gastrointestinal: Negative for abdominal pain, nausea and vomiting.  Genitourinary: Negative for dysuria.  All other systems reviewed and are negative.    Physical Exam Updated Vital Signs BP 99/68 (BP Location: Left Arm)   Pulse (!) 35   Temp (!) 96.5 F (35.8 C) (Rectal)   Resp 18   Ht 6' (1.829 m)   Wt 65.8 kg (145 lb)   SpO2 90%   BMI 19.67 kg/m   Physical Exam  Constitutional: He is oriented to person, place, and time.  Ill-appearing, mildly tachypneic  HENT:  Head: Normocephalic and atraumatic.  Neck: Neck supple. JVD present.  Cardiovascular: Exam reveals no gallop and no friction rub.   Murmur heard. Bradycardia, irregular rhythm  Pulmonary/Chest: Effort normal and breath sounds normal. No respiratory distress. He has no wheezes.  Abdominal: Soft. Bowel sounds are normal. There is no tenderness. There is no rebound.  Musculoskeletal: He exhibits edema.  3+ pitting edema bilaterally to the mid shin  Lymphadenopathy:      He has no cervical adenopathy.  Neurological: He is alert and oriented to person, place, and time.  Skin: Skin is dry.  cool  Psychiatric: He has a normal mood and affect.  Nursing note and vitals reviewed.    ED Treatments / Results  Labs (all labs ordered are listed, but only abnormal results are displayed) Labs Reviewed  CBC WITH DIFFERENTIAL/PLATELET - Abnormal; Notable for the following:       Result Value   RBC 3.34 (*)    Hemoglobin 10.3 (*)    HCT 31.7 (*)    RDW 16.3 (*)    All other components within normal limits  BASIC METABOLIC PANEL - Abnormal; Notable for the following:    CO2 17 (*)    Glucose, Bld 139 (*)    BUN 41 (*)  Creatinine, Ser 2.50 (*)    Calcium 8.7 (*)    GFR calc non Af Amer 23 (*)    GFR calc Af Amer 27 (*)    All other components within normal limits  BRAIN NATRIURETIC PEPTIDE - Abnormal; Notable for the following:    B Natriuretic Peptide 691.0 (*)    All other components within normal limits  HEPATIC FUNCTION PANEL - Abnormal; Notable for the following:    AST 65 (*)    Total Bilirubin 2.2 (*)    Bilirubin, Direct 0.7 (*)    Indirect Bilirubin 1.5 (*)    All other components within normal limits  CBG MONITORING, ED - Abnormal; Notable for the following:    Glucose-Capillary 130 (*)    All other components within normal limits  CULTURE, BLOOD (ROUTINE X 2)  CULTURE, BLOOD (ROUTINE X 2)  TROPONIN I  TSH  CORTISOL  MAGNESIUM  PHOSPHORUS  PROCALCITONIN  I-STAT TROPONIN, ED  I-STAT CG4 LACTIC ACID, ED  I-STAT CG4 LACTIC ACID, ED    EKG  EKG Interpretation  Date/Time:  Monday April 28 2017 04:10:12 EDT Ventricular Rate:  53 PR Interval:    QRS Duration: 105 QT Interval:  476 QTC Calculation: 447 R Axis:   81 Text Interpretation:  Atrial fibrillation Probable anterior infarct, age indeterminate Lateral leads are also involved Flipped T waves laterally, new when compared to 0100 Confirmed by Thayer Jew [phone]) on  05/20/2017 5:47:08 AM       Radiology Dg Chest 2 View  Result Date: 05/25/2017 CLINICAL DATA:  Shortness of breath. Bilateral lower extremity edema. Productive cough. EXAM: CHEST  2 VIEW COMPARISON:  Radiographs 12/04/2011 FINDINGS: Progressive cardiomegaly from prior exam. Tortuosity and atherosclerosis of the thoracic aorta. There is peribronchial cuffing the similar prior exams. Streaky bibasilar opacities likely atelectasis or scarring. Possible small left pleural effusion. No confluent airspace disease. The bones appear under mineralized. IMPRESSION: Increase cardiomegaly. Peribronchial cuffing suspicious for pulmonary edema. Possible small left pleural effusion. Suspect mild CHF. Streaky bibasilar opacities consistent with atelectasis or scarring. Electronically Signed   By: Jeb Levering M.D.   On: 05/04/2017 01:46   Dg Chest Portable 1 View  Result Date: 04/30/2017 CLINICAL DATA:  Central line placement EXAM: PORTABLE CHEST 1 VIEW COMPARISON:  05/21/2017 FINDINGS: The new right jugular central line extends into the right atrium and down to the bottom edge of this image. No pneumothorax. Interstitial coarsening throughout both lungs. Moderate cardiomegaly and aortic tortuosity. IMPRESSION: Right jugular central line extends at least to the right atrium and probably continues beyond the inferior edge of this image. No pneumothorax. Electronically Signed   By: Andreas Newport M.D.   On: 05/03/2017 05:44    Procedures .Central Line Date/Time: 05/08/2017 6:17 AM Performed by: Merryl Hacker Authorized by: Merryl Hacker   Consent:    Consent obtained:  Written   Consent given by:  Patient   Risks discussed:  Arterial puncture, incorrect placement and bleeding   Alternatives discussed:  No treatment Pre-procedure details:    Hand hygiene: Hand hygiene performed prior to insertion     Skin preparation:  2% chlorhexidine   Skin preparation agent: Skin preparation agent completely  dried prior to procedure   Anesthesia (see MAR for exact dosages):    Anesthesia method:  Local infiltration   Local anesthetic:  Lidocaine 2% WITH epi Procedure details:    Location:  R internal jugular   Patient position:  Reverse Trendelenburg   Procedural  supplies:  Triple lumen   Ultrasound guidance: yes     Number of attempts:  2   Successful placement: yes   Post-procedure details:    Post-procedure:  Dressing applied   Assessment:  Blood return through all ports, no pneumothorax on x-ray, free fluid flow and placement verified by x-ray   Patient tolerance of procedure:  Tolerated well, no immediate complications   (including critical care time)  CRITICAL CARE Performed by: Merryl Hacker   Total critical care time: 60 minutes  Critical care time was exclusive of separately billable procedures and treating other patients.  Critical care was necessary to treat or prevent imminent or life-threatening deterioration.  Critical care was time spent personally by me on the following activities: development of treatment plan with patient and/or surrogate as well as nursing, discussions with consultants, evaluation of patient's response to treatment, examination of patient, obtaining history from patient or surrogate, ordering and performing treatments and interventions, ordering and review of laboratory studies, ordering and review of radiographic studies, pulse oximetry and re-evaluation of patient's condition.   Medications Ordered in ED Medications  glucagon (human recombinant) (GLUCAGEN) injection 1 mg (1 mg Intravenous Given 05/18/2017 0320)  ondansetron (ZOFRAN) injection 4 mg (4 mg Intravenous Given 05/01/2017 0333)  atropine 1 MG/10ML injection (0.5 mg  Given 04/30/2017 0413)  DOPamine (INTROPIN) 800 mg in dextrose 5 % 250 mL (3.2 mg/mL) infusion (10 mcg/kg/min  65.8 kg Intravenous Rate/Dose Change 05/25/2017 0537)     Initial Impression / Assessment and Plan / ED Course  I have  reviewed the triage vital signs and the nursing notes.  Pertinent labs & imaging results that were available during my care of the patient were reviewed by me and considered in my medical decision making (see chart for details).     Patient presents with shortness of breath. Found to be in slow A. fib with heart rate 35-45 as well as hypotensive. He has a history documented in the chart of slow A. fib with multiple rate control medications. He is currently on diltiazem. Extremities were cool. He is afebrile. Workup initiated. EKG shows no acute signs of ischemia. Chest x-ray shows cardiomegaly with some signs of early pulmonary edema. Concern would be for decompensated heart failure versus cardiogenic shock. Feel that his heart rate is likely driving his hypotension. He was given a gram of glucagon and dose of atropine with no improvement. Initial troponin negative. Chest x-ray does not show pneumonia. Doubt septic shock. BNP is elevated. Creatinine up from 1.93 to 2.5. Given concern for cardiogenic origin, fluids were held. Patient was discussed with cardiology. Recommend dopamine. Line was placed in dopamine was started. This improved the patient's heart rate into the mid 60s which subsequently improved his blood pressure. Cardiology Radford Pax) recommends admission to the medical ICU. He was evaluated by the critical care team. Felt to be primary cardiogenic in nature. They request cardiology evaluate the patient for primary team.  Cardiology updated. Lab work was added to include cortisol, mag, TSH and phos levels per critical care. Limited bedside echocardiogram with fair squeeze. Patient is full code.  7:17 AM Cardiology Dr. Johnsie Cancel rounded on the patient. Did not feel this was primary cardiac in nature. Requested patient be admitted to critical care. Discussed with Dr. Pennie Banter, critical care. Patient will be evaluated. Anticipate admission.      Final Clinical Impressions(s) / ED Diagnoses    Final diagnoses:  Cardiogenic shock (HCC)  Shortness of breath  Leg edema  Symptomatic bradycardia    New Prescriptions New Prescriptions   No medications on file     Merryl Hacker, MD 05/17/2017 5110    Merryl Hacker, MD 05/05/2017 [phone]

## 2017-04-28 NOTE — ED Notes (Signed)
Patient transported to X-ray 

## 2017-04-28 NOTE — Progress Notes (Signed)
Called cardiology to report increase in creatine to  3.99. Reviewed chart and advised to continue to monitor patient for signs of fluid overload. Am cardiology round to consider nephro consult.

## 2017-04-28 NOTE — Code Documentation (Signed)
CODE BLUE NOTE  Patient Name: [name]   MRN: [id number]   Date of Birth/ Sex: 05/13/1936 , male      Admission Date: 05/14/2017  Attending Provider: Sueanne Margarita, MD  Primary Diagnosis: <principal problem not specified>    Indication: Pt was in his usual state of health until this PM, when he was noted to be bradycardic into the 30s. Nurse stated she could not feel a pulse and he was unresponsive. Code blue was subsequently called. At the time of arrival on scene, ACLS protocol was underway and completed.    Technical Description:  - CPR performance duration:  4 minutes  - Was defibrillation or cardioversion used? No   - Was external pacer placed? No  - Was patient intubated pre/post CPR? No    Medications Administered: Y = Yes; Blank = No Amiodarone    Atropine    Calcium    Epinephrine  2  Lidocaine    Magnesium    Norepinephrine    Phenylephrine    Sodium bicarbonate    Vasopressin      Post CPR evaluation:  - Final Status - Was patient successfully resuscitated ? Yes - What is current rhythm? Sinus bradycardia - What is current hemodynamic status? stable   Miscellaneous Information:  - Labs sent, including: None  - Primary team notified?  Yes  - Family Notified? No  - Additional notes/ transfer status: None        Rory Percy, DO  04/28/2017, 9:09 PM

## 2017-04-28 NOTE — Progress Notes (Signed)
  Echocardiogram 2D Echocardiogram has been performed.  [name] 05/11/2017, 10:19 AM

## 2017-04-28 NOTE — Progress Notes (Signed)
Bassel Gaskill (brother) is next of kin.  The patient does not have a wife or children.  Phone number is [phone].  Baruch Merl, MD, PhD Cardiology

## 2017-04-28 NOTE — Progress Notes (Signed)
Pt 's heart rate now ranging from 38-45 but still arousable with no change in mental status. Per pt, he feels "fine now." Dr. Domenic Polite paged and updated of pt condition. Informed of current heart rate and blood pressure trends. No new orders. Per MD, continue with current plan at this time.

## 2017-04-28 NOTE — Progress Notes (Signed)
PHARMACY - PHYSICIAN COMMUNICATION CRITICAL VALUE ALERT - BLOOD CULTURE IDENTIFICATION (BCID)  Results for orders placed or performed during the hospital encounter of 05/17/2017  Blood Culture ID Panel (Reflexed) (Collected: 05/07/2017  4:02 AM)  Result Value Ref Range   Enterococcus species NOT DETECTED NOT DETECTED   Listeria monocytogenes NOT DETECTED NOT DETECTED   Staphylococcus species DETECTED (A) NOT DETECTED   Staphylococcus aureus NOT DETECTED NOT DETECTED   Methicillin resistance NOT DETECTED NOT DETECTED   Streptococcus species NOT DETECTED NOT DETECTED   Streptococcus agalactiae NOT DETECTED NOT DETECTED   Streptococcus pneumoniae NOT DETECTED NOT DETECTED   Streptococcus pyogenes NOT DETECTED NOT DETECTED   Acinetobacter baumannii NOT DETECTED NOT DETECTED   Enterobacteriaceae species NOT DETECTED NOT DETECTED   Enterobacter cloacae complex NOT DETECTED NOT DETECTED   Escherichia coli NOT DETECTED NOT DETECTED   Klebsiella oxytoca NOT DETECTED NOT DETECTED   Klebsiella pneumoniae NOT DETECTED NOT DETECTED   Proteus species NOT DETECTED NOT DETECTED   Serratia marcescens NOT DETECTED NOT DETECTED   Haemophilus influenzae NOT DETECTED NOT DETECTED   Neisseria meningitidis NOT DETECTED NOT DETECTED   Pseudomonas aeruginosa NOT DETECTED NOT DETECTED   Candida albicans NOT DETECTED NOT DETECTED   Candida glabrata NOT DETECTED NOT DETECTED   Candida krusei NOT DETECTED NOT DETECTED   Candida parapsilosis NOT DETECTED NOT DETECTED   Candida tropicalis NOT DETECTED NOT DETECTED    Methicillin Sensitive Staph species >> likely contaminant.  If concern for infection could start Ancef.  Name of physician (or Provider) Contacted: Dr. Kenton Kingfisher (Cardiology)  Changes to prescribed antibiotics required:  None  Eliott Amparan, Rocky Crafts 04/28/2017  10:35 PM

## 2017-04-28 NOTE — Progress Notes (Addendum)
Code called at 2051. Please see Code sheet. MD Kenton Kingfisher at bedside and updated family.

## 2017-04-28 NOTE — ED Notes (Signed)
Contacted sister who was emergency contact with no answer or machine. Also contacted his nephew Shon Baton @  [phone] at the request of the patient. His nephew is the one that brought him into the hospital. Per the nephew he is aware of patient's condition and states he could not come back at this time because he had to get some rest however he would like to be notified of any status changes as well as if patient is transported to a different hospital or room. Patient advised.

## 2017-04-28 NOTE — ED Notes (Signed)
[facility] [phone]. If discharged, call this number.

## 2017-04-28 NOTE — Progress Notes (Addendum)
Pt now complaining of abdominal pain stating "it feels like I have to go but can't." Active bowel sounds with soft, non-distended, and non-tender abdomen. Dr. Elsworth Soho paged and updated of pt condition. Per MD, give PRN Tylenol at this time.   [place]: [place] PA paged to update of pt condition and inform about abdominal pain. Informed of pt's arrival to hospital and unit this AM. Per PA, give PRN Tylenol at this time.

## 2017-04-28 NOTE — ED Triage Notes (Signed)
Pt is c/o shortness of breath and bilateral lower extremity edema. Also having a productive cough with hx of CHF.

## 2017-04-28 NOTE — ED Notes (Addendum)
Notified EDP,Horton,MD. Pt. I-stat CG4 Lactic acid 2.63 and RN,Oscar made aware and I-stat troponin 0.01.

## 2017-04-29 ENCOUNTER — Telehealth: Payer: Self-pay | Admitting: *Deleted

## 2017-04-29 LAB — T4, FREE: Free T4: 0.96 ng/dL (ref 0.61–1.12)

## 2017-04-29 LAB — CBC
HCT: 33.8 % — ABNORMAL LOW (ref 39.0–52.0)
Hemoglobin: 10.1 g/dL — ABNORMAL LOW (ref 13.0–17.0)
MCH: 30.7 pg (ref 26.0–34.0)
MCHC: 29.9 g/dL — AB (ref 30.0–36.0)
MCV: 102.7 fL — AB (ref 78.0–100.0)
PLATELETS: 94 10*3/uL — AB (ref 150–400)
RBC: 3.29 MIL/uL — AB (ref 4.22–5.81)
RDW: 16.5 % — ABNORMAL HIGH (ref 11.5–15.5)
WBC: 15.9 10*3/uL — ABNORMAL HIGH (ref 4.0–10.5)

## 2017-04-29 LAB — COMPREHENSIVE METABOLIC PANEL
ALK PHOS: 71 U/L (ref 38–126)
ALT: 535 U/L — AB (ref 17–63)
ANION GAP: 23 — AB (ref 5–15)
AST: 855 U/L — ABNORMAL HIGH (ref 15–41)
Albumin: 3.2 g/dL — ABNORMAL LOW (ref 3.5–5.0)
BUN: 53 mg/dL — ABNORMAL HIGH (ref 6–20)
CHLORIDE: 102 mmol/L (ref 101–111)
CO2: 10 mmol/L — ABNORMAL LOW (ref 22–32)
CREATININE: 4.52 mg/dL — AB (ref 0.61–1.24)
Calcium: 8 mg/dL — ABNORMAL LOW (ref 8.9–10.3)
GFR, EST AFRICAN AMERICAN: 13 mL/min — AB (ref >60)
GFR, EST NON AFRICAN AMERICAN: 11 mL/min — AB (ref >60)
Glucose, Bld: 168 mg/dL — ABNORMAL HIGH (ref 65–99)
Potassium: 5.8 mmol/L — ABNORMAL HIGH (ref 3.5–5.1)
SODIUM: 135 mmol/L (ref 135–145)
Total Bilirubin: 1.9 mg/dL — ABNORMAL HIGH (ref 0.3–1.2)
Total Protein: 6.7 g/dL (ref 6.5–8.1)

## 2017-04-29 LAB — TROPONIN I: Troponin I: 0.05 ng/mL (ref <0.03)

## 2017-04-29 LAB — PROCALCITONIN: Procalcitonin: 5.86 ng/mL

## 2017-04-29 LAB — TSH: TSH: 4.044 u[IU]/mL (ref 0.350–4.500)

## 2017-04-29 MED ORDER — CHLORHEXIDINE GLUCONATE 0.12% ORAL RINSE (MEDLINE KIT)
15.0000 mL | Freq: Two times a day (BID) | OROMUCOSAL | Status: DC
  Administered 2017-04-29: 15 mL via OROMUCOSAL

## 2017-04-29 MED ORDER — ORAL CARE MOUTH RINSE
15.0000 mL | Freq: Four times a day (QID) | OROMUCOSAL | Status: DC
  Administered 2017-04-29: 15 mL via OROMUCOSAL

## 2017-04-29 MED FILL — Medication: Qty: 1 | Status: AC

## 2017-04-30 LAB — CULTURE, BLOOD (ROUTINE X 2): SPECIAL REQUESTS: ADEQUATE

## 2017-05-03 LAB — CULTURE, BLOOD (ROUTINE X 2)
Culture: NO GROWTH
SPECIAL REQUESTS: ADEQUATE

## 2017-05-06 ENCOUNTER — Ambulatory Visit: Payer: Self-pay | Admitting: Podiatry

## 2017-05-22 ENCOUNTER — Ambulatory Visit: Payer: Medicare HMO | Admitting: Cardiology

## 2017-05-23 ENCOUNTER — Institutional Professional Consult (permissible substitution): Payer: Medicare HMO | Admitting: Internal Medicine

## 2017-05-26 NOTE — Discharge Summary (Signed)
Discharge summary/Death Summary:   Hospital narrative:   Pt admitted 05/10/2017 with weakness. He was bradycardic on admission. He has severe COPD, known chronic diastolic CHF, chronic atrial fib, stage 3 CKD and failure to thrive. He was managed with dopamine through the day with response in HR. On the evening of 05/02/2017 he began to have abdominal pain and HR noted to be in the 40s. He then had a PEA arrest and was successfully resuscitated. He was placed on Levophed and epinephrine drips along with dopamine drip. He was intubated. HR in the 55-65 range. Echo reviewed by me and shows small RV and LV with overall normal appearance in RV and LV function but very large right and left atria. There is severe MR and severe TR. His echo appearance is c/w a restrictive cardiomyopathy. No evidence of pericardial effusion.   Following the PEA arrest, pH of 6.95, lactic acid 11.3. Creatinine 3.99 up from 2.5.   I discussed the case with our on call Advanced CHF team/shock team doctor. The etiology of his acute decompensation is not clear. He likely is in the final stages of advanced heart failure. It was not clear that there is an acute intervention that would reverse his downward hemodynamic spiral. I do not think a temporary pacemaker would be beneficial. He would likely need HD and he is not a candidate for HD. Given his reduced cardiac output, he would not likely tolerate HD. He is not a surgical candidate for correction of his valve disease. When I saw him around 10:30 pm he was on three pressors with BP of 69/44.   I spoke to his brother, Lonnell Chaput, on the phone at 11pm on 05/11/2017. The patient is not married and has no children. His brother is his next of kin. After discussion with the entire care team, we decided not to advance his level of care. He was made DNR. We planned not to advance therapy/drips. He passed away at 65 am. Dr. Kenton Kingfisher was notified. Family notified.   Discharge meds: None  Discharge  follow up: None  Lauree Chandler 05-27-17 11:34 AM

## 2017-05-26 NOTE — Telephone Encounter (Signed)
Patient admitted to ED. ------------------------------------------------------------------------------------------- PLEASE NOTE: All timestamps contained within this report are represented as [place] Standard Time. CONFIDENTIALTY NOTICE: This fax transmission is intended only for the addressee. It contains information that is legally privileged, confidential or otherwise protected from use or disclosure. If you are not the intended recipient, you are strictly prohibited from reviewing, disclosing, copying using or disseminating any of this information or taking any action in reliance on or regarding this information. If you have received this fax in error, please notify us immediately by telephone so that we can arrange for its return to [place]. Phone: [phone], Toll-Free: [phone], Fax: [phone] Page: 1 of 2 Call Id: [id number] [place] [facility] [place] Patient Name: [name] Gender: Male DOB: [birth year] Age: 81 Y 84 M 19 D Return Phone Number: [phone] (Primary) City/State/Zip: [place] [address] Client [facility] [place] Client Site [facility] [place] Night Who Is Calling Patient / Member / Family / Caregiver Call Type Triage / Clinical Caller Name Lance Bosch Relationship To Patient Daughter Return Phone Number [phone] (Primary) Chief Complaint Prescription Refill or Medication Request (non symptomatic) Reason for Call Medication Question / Request Initial Comment Caller says her father is completely out of his meds. Sees Dr. Margarita Rana (not listed) Nurse Assessment Nurse: Jake Samples, RN, Melissa Date/Time Eilene Ghazi Time): 04/27/2017 11:51:30 PM Confirm and document reason for call. If symptomatic, describe symptoms. ---Caller states that her father is completely out of his fluid pill and he last took one on Thursday, but he just told  her he was out. His feet are swollen and he is unable to put his shoe on. He stated that he is having trouble breathing. Does the PT have any chronic conditions? (i.e. diabetes, asthma, etc.) ---Yes List chronic conditions. ---edema, heart, lungs, kidneys Guidelines Guideline Title Affirmed Question Breathing Difficulty [1] MODERATE difficulty breathing (e.g., speaks in phrases, SOB even at rest, pulse 100-120) AND [2] NEW-onset or WORSE than normal Disp. Time Eilene Ghazi Time) Disposition Final User 05/08/2017 12:08:32 AM Go to ED Now Yes Jake Samples, RN, Melissa Referrals [facility] Care Advice Given Per Guideline GO TO ED NOW: You need to be seen in the Emergency Department. Go to the ER at ___________ [place] now. Drive carefully. NOTE TO TRIAGER - DRIVING: * Another adult should drive. * If immediate transportation is not available via car or taxi, then the patient should be instructed to call [id number]. BRING MEDICINES: * Please bring a list of your current medicines when you go to the Emergency Department (ER). * It is also a good idea to bring the pill bottles too. This will help the doctor to make certain you are taking the right medicines and the right dose. CALL EMS 911 IF: you become worse. CARE ADVICE given per Breathing Difficulty (Adult) guideline. PLEASE NOTE: All timestamps contained within this report are represented as [place] Standard Time. CONFIDENTIALTY NOTICE: This fax transmission is intended only for the addressee. It contains information that is legally privileged, confidential or otherwise protected from use or disclosure. If you are not the intended recipient, you are strictly prohibited from reviewing, disclosing, copying using or disseminating any of this information or taking any action in reliance on or regarding this information. If you have received this fax in error, please notify us immediately by telephone so that we can arrange for  its  return to [place]. Phone: [phone], Toll-Free: [phone], Fax: [phone] Page: 2 of 2 Call Id: [id number] Comments User: Cherylin Mylar, RN Date/Time Eilene Ghazi Time): 04/28/2017 12:19:37 AM Spoke with caller's father and he stated that he is having trouble breathing and is going to go into the ED. He is going to call his nephew, but if he can't he will call for an ambulance. His daughter is also aware and she is calling him to make sure he goes in.

## 2017-05-26 NOTE — Progress Notes (Deleted)
Note entered in error

## 2017-05-26 NOTE — Progress Notes (Signed)
Transported to morgue via [facility].  Belongings cane,  clothing and medications given to [employer] and [employer] (Brother).  Card for Patient Placement also given to family.

## 2017-05-26 NOTE — Progress Notes (Signed)
Patient Asystole on monitor. Patient pronounced at 0544 by Rick Duff RN and Eleonore Chiquito RN. Dr. Baruch Merl with cardiology notified, along with [facility].

## 2017-05-26 DEATH — deceased

## 2017-11-21 IMAGING — DX DG CHEST 1V PORT
1 series · 1 of 1 positions shown · non-contrast
Comparison: 04/28/2017, 12/04/2011

CLINICAL DATA: Status post code, line placement

EXAM:
PORTABLE CHEST 1 VIEW

[chest ap]
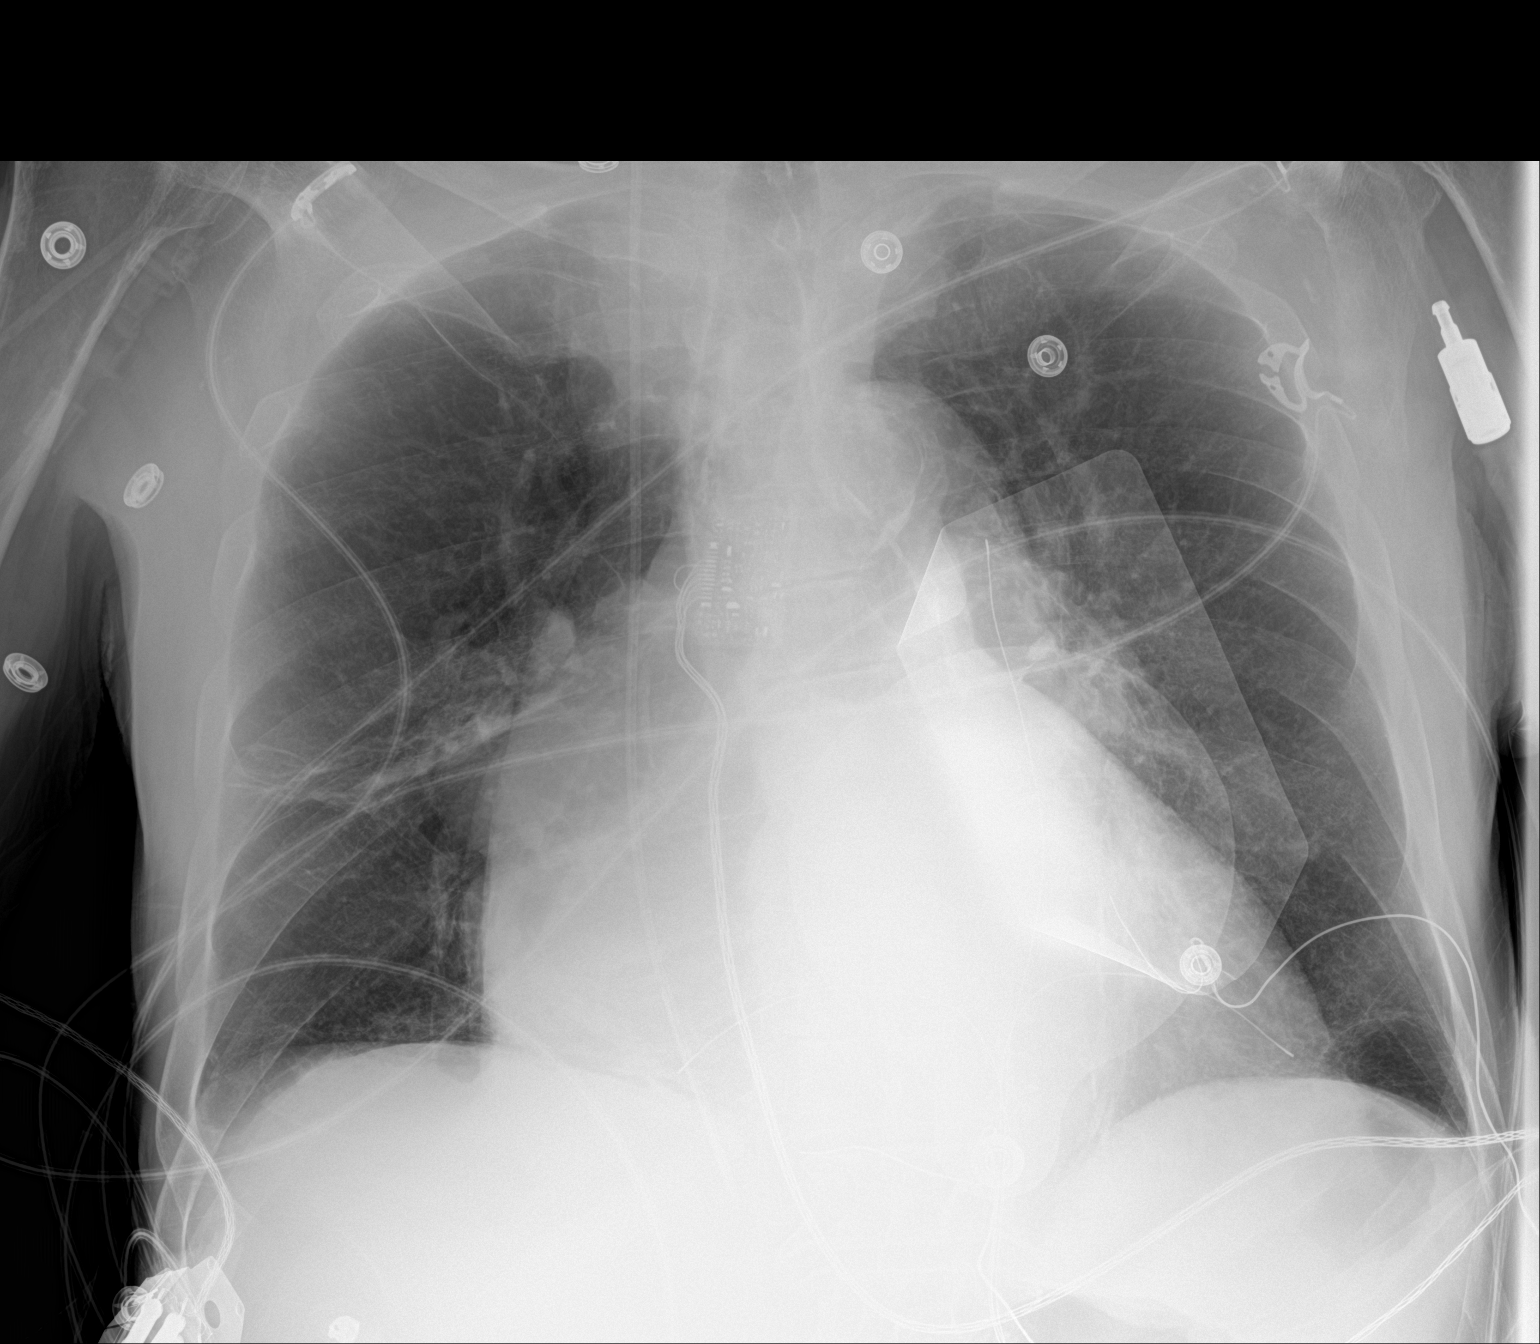

[1 of 1 positions shown; findings below may reference images not displayed]

FINDINGS: Right jugular venous catheter tip appears to overlie a the right
atrial IVC junction. Marked cardiomegaly, increased compared to
0862. Atherosclerotic calcifications of the aorta. Possible tiny
right effusion. Linear atelectasis in the right mid lung. No
definite pneumothorax.
IMPRESSION: 1. Right-sided central venous catheter tip appears to project over
the intra hepatic IVC. No right pneumothorax
2. Marked cardiomegaly, increased compared to 0862, consider
pericardial effusion
3. Linear atelectasis in the right mid lung.

## 2017-11-21 IMAGING — CR DG CHEST 2V
2 series · 2 of 2 positions shown · non-contrast
Comparison: Radiographs 12/04/2011

CLINICAL DATA: Shortness of breath. Bilateral lower extremity
edema. Productive cough.

EXAM:
CHEST  2 VIEW

[w chest lat]
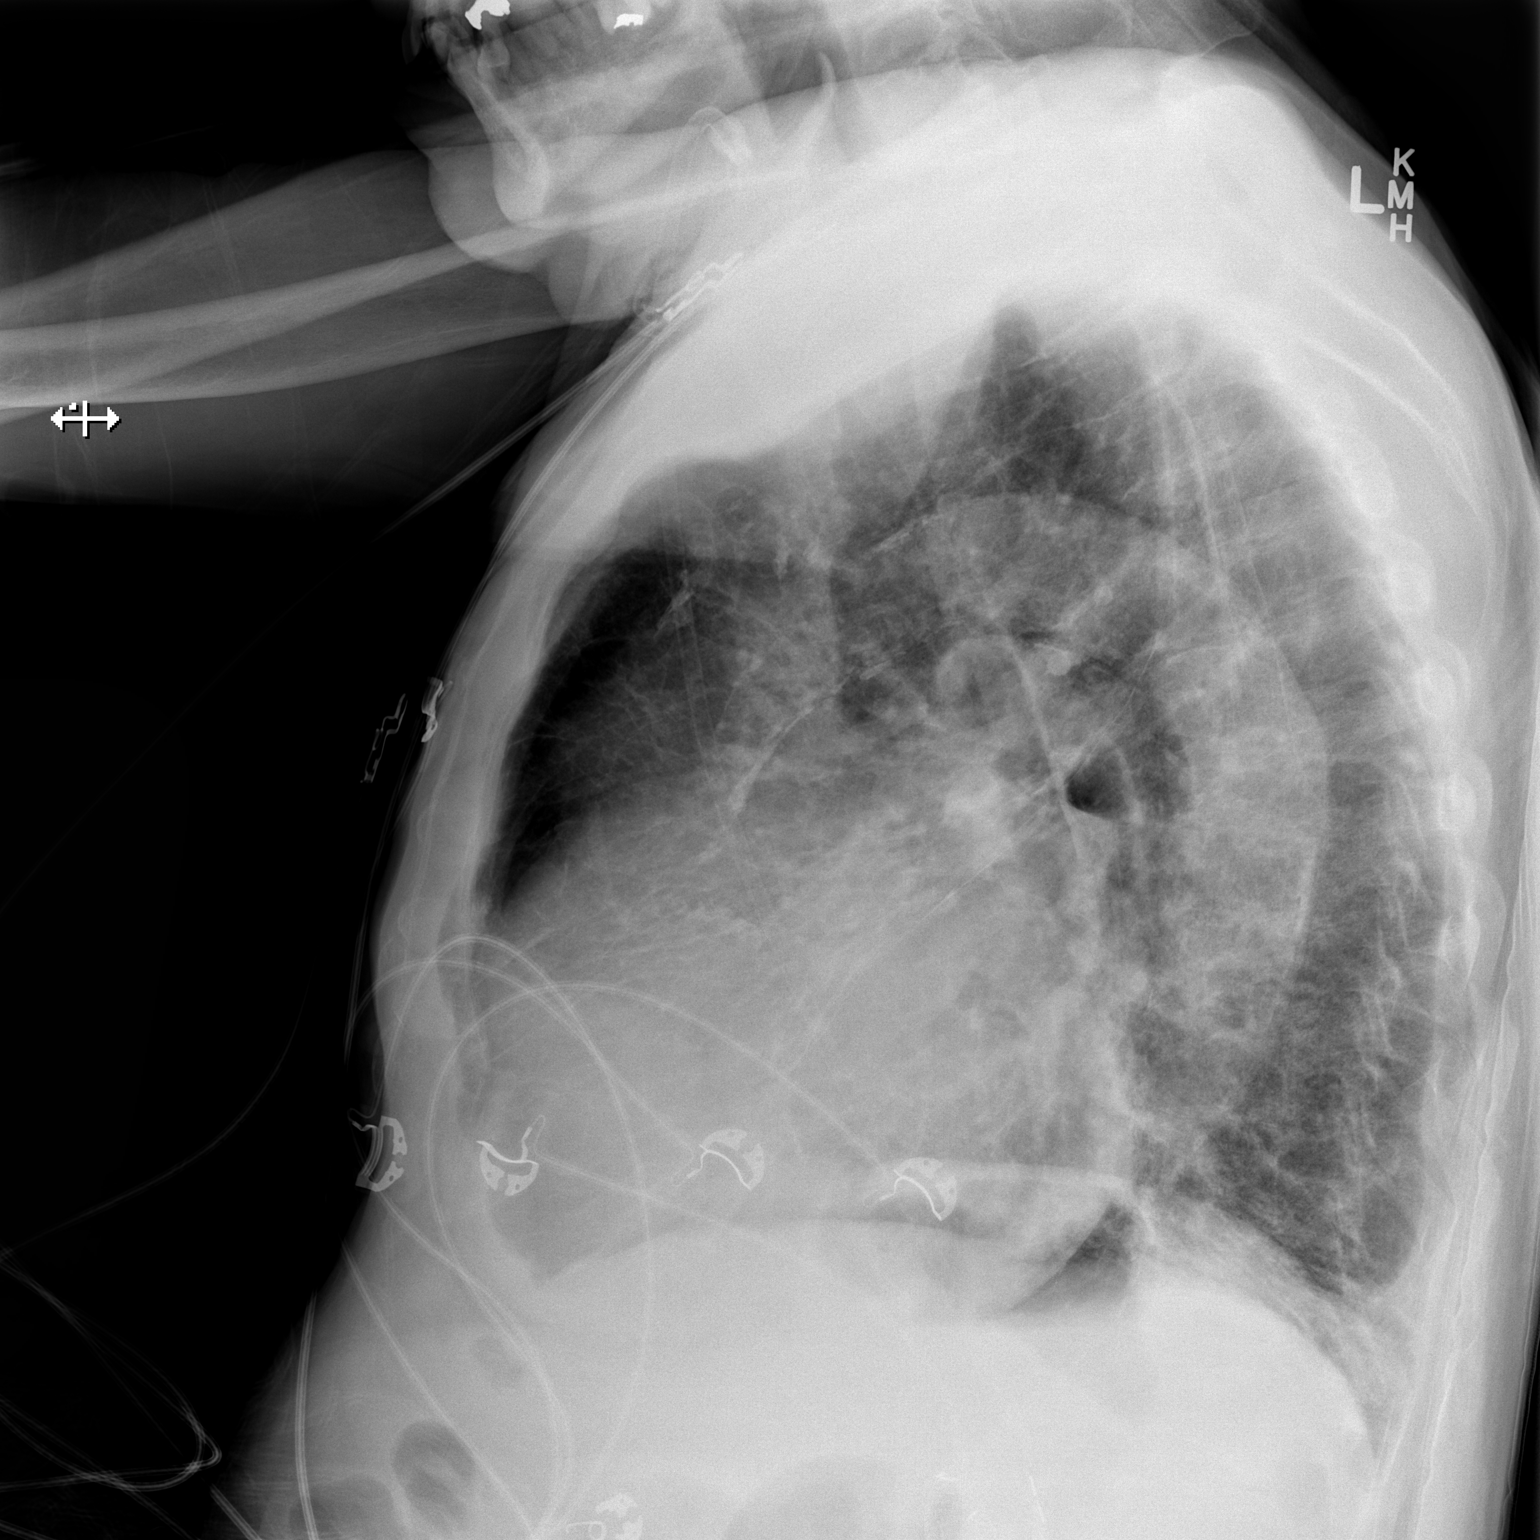

[x chest ap]
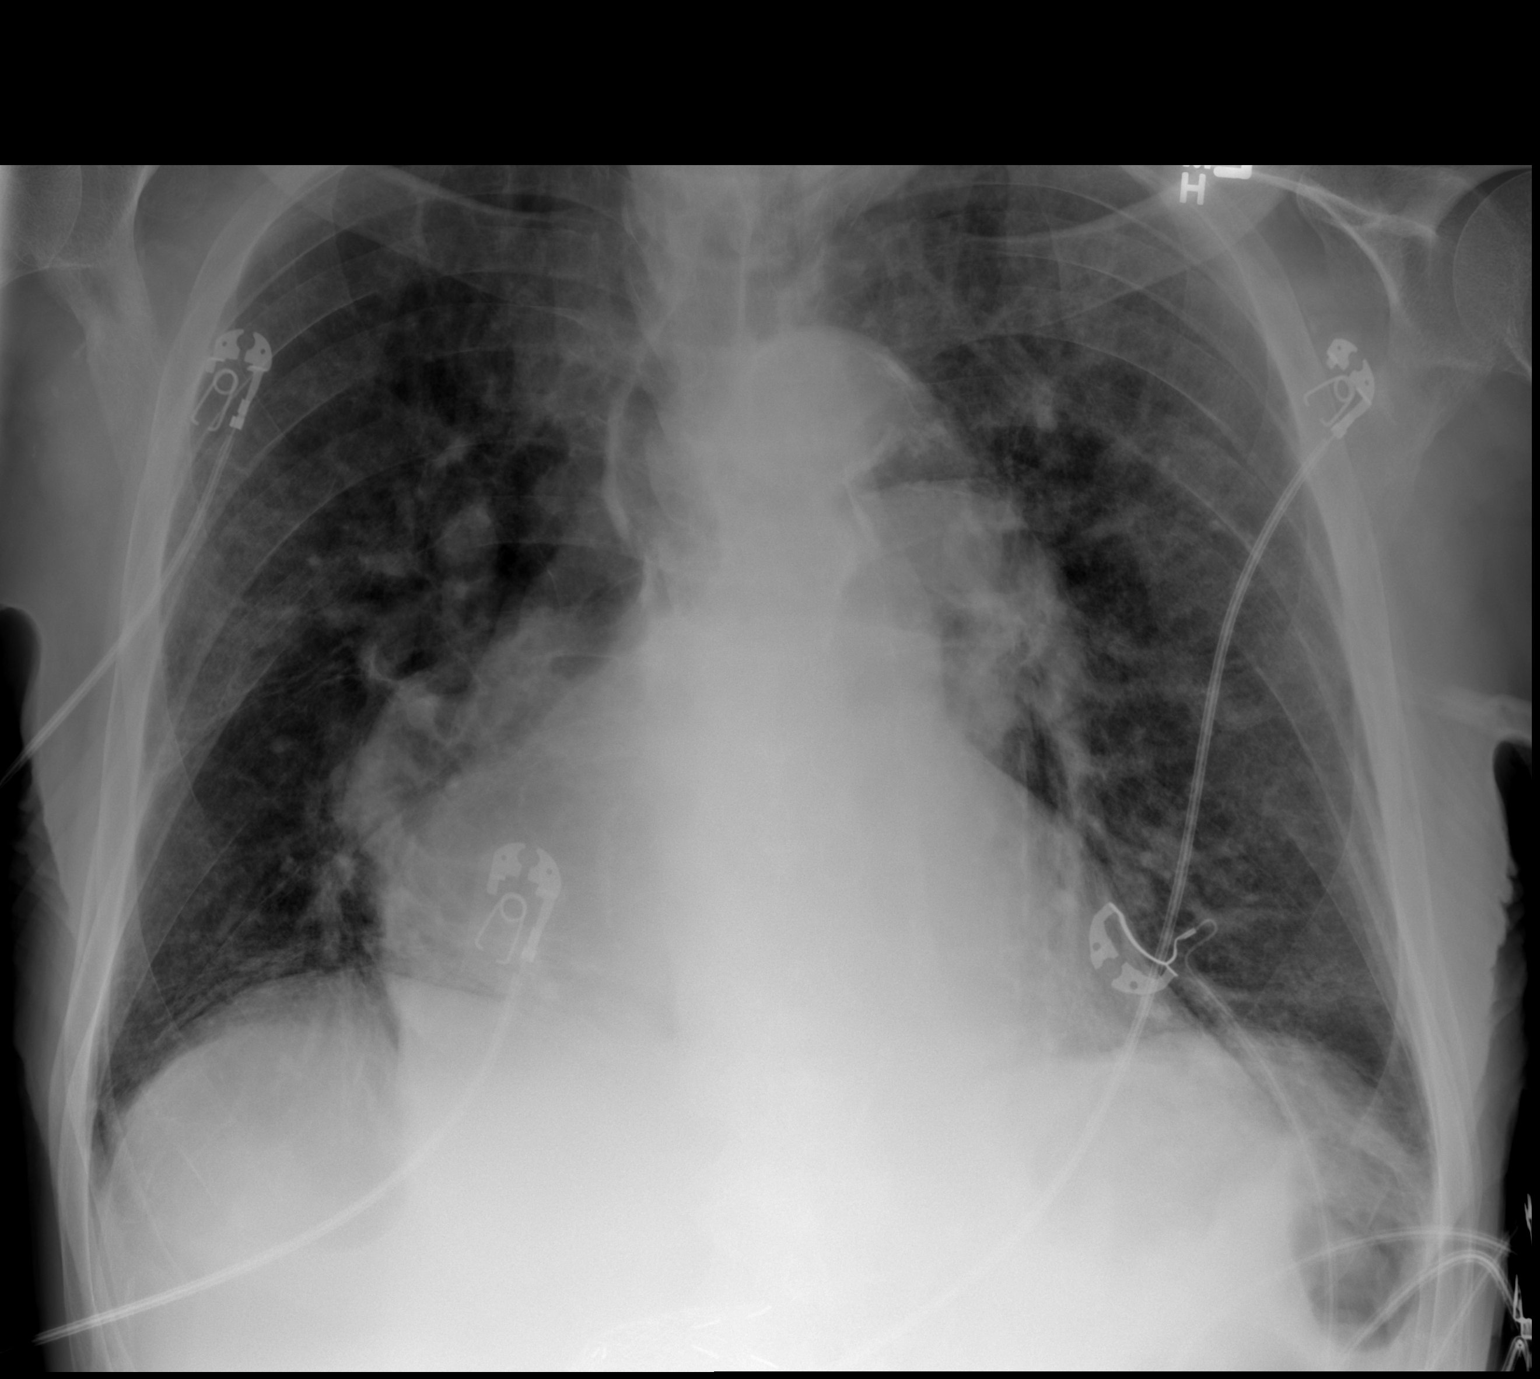

[2 of 2 positions shown; findings below may reference images not displayed]

FINDINGS: Progressive cardiomegaly from prior exam. Tortuosity and
atherosclerosis of the thoracic aorta. There is peribronchial
cuffing the similar prior exams. Streaky bibasilar opacities likely
atelectasis or scarring. Possible small left pleural effusion. No
confluent airspace disease. The bones appear under mineralized.
IMPRESSION: Increase cardiomegaly. Peribronchial cuffing suspicious for
pulmonary edema. Possible small left pleural effusion. Suspect mild
CHF.

Streaky bibasilar opacities consistent with atelectasis or scarring.
